# Patient Record
Sex: Female | Born: 1990 | Race: Black or African American | Hispanic: No | Marital: Single | Smoking: Former smoker
Health system: Southern US, Community
[De-identification: ages and names within clinical notes are randomized; demographics above are authoritative.]

## PROBLEM LIST (undated history)

## (undated) DIAGNOSIS — D649 Anemia, unspecified: Secondary | ICD-10-CM

## (undated) DIAGNOSIS — R87629 Unspecified abnormal cytological findings in specimens from vagina: Secondary | ICD-10-CM

## (undated) DIAGNOSIS — R7303 Prediabetes: Secondary | ICD-10-CM

## (undated) DIAGNOSIS — J9690 Respiratory failure, unspecified, unspecified whether with hypoxia or hypercapnia: Secondary | ICD-10-CM

## (undated) DIAGNOSIS — K439 Ventral hernia without obstruction or gangrene: Secondary | ICD-10-CM

## (undated) DIAGNOSIS — Z5189 Encounter for other specified aftercare: Secondary | ICD-10-CM

## (undated) HISTORY — DX: Encounter for other specified aftercare: Z51.89

## (undated) HISTORY — DX: Ventral hernia without obstruction or gangrene: K43.9

## (undated) HISTORY — DX: Respiratory failure, unspecified, unspecified whether with hypoxia or hypercapnia: J96.90

## (undated) HISTORY — DX: Unspecified abnormal cytological findings in specimens from vagina: R87.629

## (undated) HISTORY — DX: Anemia, unspecified: D64.9

---

## 2018-03-24 ENCOUNTER — Ambulatory Visit (INDEPENDENT_AMBULATORY_CARE_PROVIDER_SITE_OTHER): Payer: Self-pay

## 2018-03-24 ENCOUNTER — Encounter: Payer: Self-pay | Admitting: Obstetrics

## 2018-03-24 VITALS — BP 129/83 | HR 67 | Wt 247.6 lb

## 2018-03-24 DIAGNOSIS — Z3200 Encounter for pregnancy test, result unknown: Secondary | ICD-10-CM

## 2018-03-24 DIAGNOSIS — Z3201 Encounter for pregnancy test, result positive: Secondary | ICD-10-CM

## 2018-03-24 LAB — POCT URINE PREGNANCY: Preg Test, Ur: POSITIVE — AB

## 2018-03-24 NOTE — Progress Notes (Signed)
Pt is here for pregnancy test, UPT positive. Pt is very unsure of LMP. Possibly the last week of December.

## 2018-04-24 ENCOUNTER — Other Ambulatory Visit: Payer: Self-pay

## 2018-04-24 ENCOUNTER — Encounter: Payer: Self-pay | Admitting: Advanced Practice Midwife

## 2018-04-24 ENCOUNTER — Other Ambulatory Visit (HOSPITAL_COMMUNITY)
Admission: RE | Admit: 2018-04-24 | Discharge: 2018-04-24 | Disposition: A | Discharge status: Home / Self Care | Payer: Medicaid Other | Source: Ambulatory Visit | Attending: Advanced Practice Midwife | Admitting: Advanced Practice Midwife

## 2018-04-24 ENCOUNTER — Ambulatory Visit (INDEPENDENT_AMBULATORY_CARE_PROVIDER_SITE_OTHER): Payer: Medicaid Other | Admitting: Advanced Practice Midwife

## 2018-04-24 VITALS — BP 139/86 | HR 77 | Temp 97.4°F | Ht 62.0 in | Wt 247.8 lb

## 2018-04-24 DIAGNOSIS — Z3A10 10 weeks gestation of pregnancy: Secondary | ICD-10-CM | POA: Diagnosis not present

## 2018-04-24 DIAGNOSIS — Z23 Encounter for immunization: Secondary | ICD-10-CM

## 2018-04-24 DIAGNOSIS — Z3480 Encounter for supervision of other normal pregnancy, unspecified trimester: Secondary | ICD-10-CM | POA: Diagnosis present

## 2018-04-24 DIAGNOSIS — Z3481 Encounter for supervision of other normal pregnancy, first trimester: Secondary | ICD-10-CM | POA: Insufficient documentation

## 2018-04-24 DIAGNOSIS — O34219 Maternal care for unspecified type scar from previous cesarean delivery: Secondary | ICD-10-CM | POA: Insufficient documentation

## 2018-04-24 DIAGNOSIS — Z98891 History of uterine scar from previous surgery: Secondary | ICD-10-CM | POA: Insufficient documentation

## 2018-04-24 DIAGNOSIS — Z3687 Encounter for antenatal screening for uncertain dates: Secondary | ICD-10-CM

## 2018-04-24 DIAGNOSIS — O131 Gestational [pregnancy-induced] hypertension without significant proteinuria, first trimester: Secondary | ICD-10-CM

## 2018-04-24 NOTE — Progress Notes (Signed)
New OB. FLU vaccine given in LD, tolerated well.

## 2018-04-24 NOTE — Patient Instructions (Signed)
First Trimester of Pregnancy  The first trimester of pregnancy is from week 1 until the end of week 13 (months 1 through 3). A week after a sperm fertilizes an egg, the egg will implant on the wall of the uterus. This embryo will begin to develop into a baby. Genes from you and your partner will form the baby. The female genes will determine whether the baby will be a boy or a girl. At 6-8 weeks, the eyes and face will be formed, and the heartbeat can be seen on ultrasound. At the end of 12 weeks, all the baby's organs will be formed.  Now that you are pregnant, you will want to do everything you can to have a healthy baby. Two of the most important things are to get good prenatal care and to follow your health care provider's instructions. Prenatal care is all the medical care you receive before the baby's birth. This care will help prevent, find, and treat any problems during the pregnancy and childbirth.  Body changes during your first trimester  Your body goes through many changes during pregnancy. The changes vary from woman to woman.   You may gain or lose a couple of pounds at first.   You may feel sick to your stomach (nauseous) and you may throw up (vomit). If the vomiting is uncontrollable, call your health care provider.   You may tire easily.   You may develop headaches that can be relieved by medicines. All medicines should be approved by your health care provider.   You may urinate more often. Painful urination may mean you have a bladder infection.   You may develop heartburn as a result of your pregnancy.   You may develop constipation because certain hormones are causing the muscles that push stool through your intestines to slow down.   You may develop hemorrhoids or swollen veins (varicose veins).   Your breasts may begin to grow larger and become tender. Your nipples may stick out more, and the tissue that surrounds them (areola) may become darker.   Your gums may bleed and may be  sensitive to brushing and flossing.   Dark spots or blotches (chloasma, mask of pregnancy) may develop on your face. This will likely fade after the baby is born.   Your menstrual periods will stop.   You may have a loss of appetite.   You may develop cravings for certain kinds of food.   You may have changes in your emotions from day to day, such as being excited to be pregnant or being concerned that something may go wrong with the pregnancy and baby.   You may have more vivid and strange dreams.   You may have changes in your hair. These can include thickening of your hair, rapid growth, and changes in texture. Some women also have hair loss during or after pregnancy, or hair that feels dry or thin. Your hair will most likely return to normal after your baby is born.  What to expect at prenatal visits  During a routine prenatal visit:   You will be weighed to make sure you and the baby are growing normally.   Your blood pressure will be taken.   Your abdomen will be measured to track your baby's growth.   The fetal heartbeat will be listened to between weeks 10 and 14 of your pregnancy.   Test results from any previous visits will be discussed.  Your health care provider may ask you:     How you are feeling.   If you are feeling the baby move.   If you have had any abnormal symptoms, such as leaking fluid, bleeding, severe headaches, or abdominal cramping.   If you are using any tobacco products, including cigarettes, chewing tobacco, and electronic cigarettes.   If you have any questions.  Other tests that may be performed during your first trimester include:   Blood tests to find your blood type and to check for the presence of any previous infections. The tests will also be used to check for low iron levels (anemia) and protein on red blood cells (Rh antibodies). Depending on your risk factors, or if you previously had diabetes during pregnancy, you may have tests to check for high blood sugar  that affects pregnant women (gestational diabetes).   Urine tests to check for infections, diabetes, or protein in the urine.   An ultrasound to confirm the proper growth and development of the baby.   Fetal screens for spinal cord problems (spina bifida) and Down syndrome.   HIV (human immunodeficiency virus) testing. Routine prenatal testing includes screening for HIV, unless you choose not to have this test.   You may need other tests to make sure you and the baby are doing well.  Follow these instructions at home:  Medicines   Follow your health care provider's instructions regarding medicine use. Specific medicines may be either safe or unsafe to take during pregnancy.   Take a prenatal vitamin that contains at least 600 micrograms (mcg) of folic acid.   If you develop constipation, try taking a stool softener if your health care provider approves.  Eating and drinking     Eat a balanced diet that includes fresh fruits and vegetables, whole grains, good sources of protein such as meat, eggs, or tofu, and low-fat dairy. Your health care provider will help you determine the amount of weight gain that is right for you.   Avoid raw meat and uncooked cheese. These carry germs that can cause birth defects in the baby.   Eating four or five small meals rather than three large meals a day may help relieve nausea and vomiting. If you start to feel nauseous, eating a few soda crackers can be helpful. Drinking liquids between meals, instead of during meals, also seems to help ease nausea and vomiting.   Limit foods that are high in fat and processed sugars, such as fried and sweet foods.   To prevent constipation:  ? Eat foods that are high in fiber, such as fresh fruits and vegetables, whole grains, and beans.  ? Drink enough fluid to keep your urine clear or pale yellow.  Activity   Exercise only as directed by your health care provider. Most women can continue their usual exercise routine during  pregnancy. Try to exercise for 30 minutes at least 5 days a week. Exercising will help you:  ? Control your weight.  ? Stay in shape.  ? Be prepared for labor and delivery.   Experiencing pain or cramping in the lower abdomen or lower back is a good sign that you should stop exercising. Check with your health care provider before continuing with normal exercises.   Try to avoid standing for long periods of time. Move your legs often if you must stand in one place for a long time.   Avoid heavy lifting.   Wear low-heeled shoes and practice good posture.   You may continue to have sex unless your health care   provider tells you not to.  Relieving pain and discomfort   Wear a good support bra to relieve breast tenderness.   Take warm sitz baths to soothe any pain or discomfort caused by hemorrhoids. Use hemorrhoid cream if your health care provider approves.   Rest with your legs elevated if you have leg cramps or low back pain.   If you develop varicose veins in your legs, wear support hose. Elevate your feet for 15 minutes, 3-4 times a day. Limit salt in your diet.  Prenatal care   Schedule your prenatal visits by the twelfth week of pregnancy. They are usually scheduled monthly at first, then more often in the last 2 months before delivery.   Write down your questions. Take them to your prenatal visits.   Keep all your prenatal visits as told by your health care provider. This is important.  Safety   Wear your seat belt at all times when driving.   Make a list of emergency phone numbers, including numbers for family, friends, the hospital, and police and fire departments.  General instructions   Ask your health care provider for a referral to a local prenatal education class. Begin classes no later than the beginning of month 6 of your pregnancy.   Ask for help if you have counseling or nutritional needs during pregnancy. Your health care provider can offer advice or refer you to specialists for help  with various needs.   Do not use hot tubs, steam rooms, or saunas.   Do not douche or use tampons or scented sanitary pads.   Do not cross your legs for long periods of time.   Avoid cat litter boxes and soil used by cats. These carry germs that can cause birth defects in the baby and possibly loss of the fetus by miscarriage or stillbirth.   Avoid all smoking, herbs, alcohol, and medicines not prescribed by your health care provider. Chemicals in these products affect the formation and growth of the baby.   Do not use any products that contain nicotine or tobacco, such as cigarettes and e-cigarettes. If you need help quitting, ask your health care provider. You may receive counseling support and other resources to help you quit.   Schedule a dentist appointment. At home, brush your teeth with a soft toothbrush and be gentle when you floss.  Contact a health care provider if:   You have dizziness.   You have mild pelvic cramps, pelvic pressure, or nagging pain in the abdominal area.   You have persistent nausea, vomiting, or diarrhea.   You have a bad smelling vaginal discharge.   You have pain when you urinate.   You notice increased swelling in your face, hands, legs, or ankles.   You are exposed to fifth disease or chickenpox.   You are exposed to German measles (rubella) and have never had it.  Get help right away if:   You have a fever.   You are leaking fluid from your vagina.   You have spotting or bleeding from your vagina.   You have severe abdominal cramping or pain.   You have rapid weight gain or loss.   You vomit blood or material that looks like coffee grounds.   You develop a severe headache.   You have shortness of breath.   You have any kind of trauma, such as from a fall or a car accident.  Summary   The first trimester of pregnancy is from week 1 until   the end of week 13 (months 1 through 3).   Your body goes through many changes during pregnancy. The changes vary from  woman to woman.   You will have routine prenatal visits. During those visits, your health care provider will examine you, discuss any test results you may have, and talk with you about how you are feeling.  This information is not intended to replace advice given to you by your health care provider. Make sure you discuss any questions you have with your health care provider.  Document Released: 01/26/2001 Document Revised: 01/14/2016 Document Reviewed: 01/14/2016  Elsevier Interactive Patient Education  2019 [employer]

## 2018-04-24 NOTE — Progress Notes (Signed)
Subjective:   Cheryl Fischer is a 28 y.o. [id number] at [redacted]w[redacted]d by unsure LMP being seen today for her first obstetrical visit.  Her obstetrical history is significant for C/S x 4 with 2 set of twins and has Supervision of other normal pregnancy, antepartum on their problem list.. Patient does intend to breast feed. Pregnancy history fully reviewed.  Patient reports no complaints.  HISTORY: OB History  Gravida Para Term Preterm AB Living  5 4 3 1  0 7  SAB TAB Ectopic Multiple Live Births  0 0 0 2 6    # Outcome Date GA Lbr Len/2nd Weight Sex Delivery Anes PTL Lv  5 Current           4A Preterm  [redacted]w[redacted]d    CS-LVertical   LIV  4B Preterm  [redacted]w[redacted]d    CS-LVertical   LIV  3A Term      CS-LVertical   LIV  3B Term      CS-LVertical   LIV  2 Term      CS-LVertical   LIV  1 Term      CS-LVertical   LIV    Obstetric Comments  Twins   Past Medical History:  Diagnosis Date  . Anemia   . Blood transfusion without reported diagnosis   . Vaginal Pap smear, abnormal    Past Surgical History:  Procedure Laterality Date  . CESAREAN SECTION     Family History  Problem Relation Age of Onset  . Cancer Paternal Grandmother    Social History   Tobacco Use  . Smoking status: Never Smoker  . Smokeless tobacco: Never Used  Substance Use Topics  . Alcohol use: Never    Frequency: Never  . Drug use: Never   Allergies  Allergen Reactions  . Penicillins Hives   No current outpatient medications on file prior to visit.   No current facility-administered medications on file prior to visit.      Exam   Vitals:   04/24/18 1002 04/24/18 1003 04/24/18 1004  BP: (!) 143/97 139/86   Pulse: 71 77   Temp: (!) 97.4 F (36.3 C)    Weight: 112.4 kg    Height:   5\' 2"  (1.575 m)   Retake BP 130s/80s.  Fetal Heart Rate (bpm): 164  Uterus:     Pelvic Exam: Perineum: no hemorrhoids, normal perineum   Vulva: normal external genitalia, no lesions   Vagina:  normal mucosa, normal discharge   Cervix: no lesions and normal, pap smear done.    Adnexa: normal adnexa and no mass, fullness, tenderness   Bony Pelvis: average  System: General: well-developed, well-nourished female in no acute distress   Breast:  normal appearance, no masses or tenderness   Skin: normal coloration and turgor, no rashes   Neurologic: oriented, normal, negative, normal mood   Extremities: normal strength, tone, and muscle mass, ROM of all joints is normal   HEENT PERRLA, extraocular movement intact and sclera clear, anicteric   Mouth/Teeth mucous membranes moist, pharynx normal without lesions and dental hygiene good   Neck supple and no masses   Cardiovascular: regular rate and rhythm   Respiratory:  no respiratory distress, normal breath sounds   Abdomen: soft, non-tender; bowel sounds normal; no masses,  no organomegaly     Assessment:   Pregnancy: [id number] Patient Active Problem List   Diagnosis Date Noted  . Supervision of other normal pregnancy, antepartum 04/24/2018     Plan:  1. Supervision  of other normal pregnancy, antepartum --Anticipatory guidance about next visits/weeks of pregnancy given. - Obstetric Panel, Including HIV - Culture, OB Urine - Cytology - PAP( [place]) - Genetic Screening - Cervicovaginal ancillary only( [place])  2. Transient hypertension of pregnancy in first trimester --Pt denies any hx HTN.  Reports eating salty breakfast today.  Retake BP wnl so will continue to monitor.  3. Unsure of LMP (last menstrual period) as reason for ultrasound scan - US OB Comp Less 14 Wks; Future  4. History of cesarean delivery, currently pregnant --Hx C/S x 4 with 2 sets of twins, one at term, one at 34 weeks, 7 living children.    Initial labs drawn. Continue prenatal vitamins. Discussed and offered genetic screening options, including Quad screen/AFP, NIPS testing, and option to decline testing. Benefits/risks/alternatives reviewed. Pt aware that anatomy US is  form of genetic screening with lower accuracy in detecting trisomies than blood work.  Pt chooses/declines genetic screening today. NIPS: ordered. Ultrasound discussed; fetal anatomic survey: requested. Problem list reviewed and updated. The nature of [facility] with multiple MDs and other Advanced Practice Providers was explained to patient; also emphasized that residents, students are part of our team. Routine obstetric precautions reviewed. Return in about 4 weeks (around 05/22/2018).   Sharen Counter, CNM 04/24/18 11:15 AM

## 2018-04-25 LAB — OBSTETRIC PANEL, INCLUDING HIV
Antibody Screen: NEGATIVE
Basophils Absolute: 0 10*3/uL (ref 0.0–0.2)
Basos: 0 %
EOS (ABSOLUTE): 0.4 10*3/uL (ref 0.0–0.4)
Eos: 5 %
HIV Screen 4th Generation wRfx: NONREACTIVE
Hematocrit: 38.1 % (ref 34.0–46.6)
Hemoglobin: 13.1 g/dL (ref 11.1–15.9)
Hepatitis B Surface Ag: NEGATIVE
Immature Grans (Abs): 0 10*3/uL (ref 0.0–0.1)
Immature Granulocytes: 0 %
Lymphocytes Absolute: 1.7 10*3/uL (ref 0.7–3.1)
Lymphs: 20 %
MCH: 30.8 pg (ref 26.6–33.0)
MCHC: 34.4 g/dL (ref 31.5–35.7)
MCV: 89 fL (ref 79–97)
MONOCYTES: 5 %
Monocytes Absolute: 0.4 10*3/uL (ref 0.1–0.9)
Neutrophils Absolute: 5.8 10*3/uL (ref 1.4–7.0)
Neutrophils: 70 %
Platelets: 225 10*3/uL (ref 150–450)
RBC: 4.26 x10E6/uL (ref 3.77–5.28)
RDW: 12.6 % (ref 11.7–15.4)
RH TYPE: POSITIVE
RPR: NONREACTIVE
Rubella Antibodies, IGG: 1.84 index (ref >0.99)
WBC: 8.3 10*3/uL (ref 3.4–10.8)

## 2018-04-25 LAB — CYTOLOGY - PAP
Diagnosis: NEGATIVE
HPV: NOT DETECTED

## 2018-04-25 LAB — CERVICOVAGINAL ANCILLARY ONLY
Bacterial vaginitis: NEGATIVE
CHLAMYDIA, DNA PROBE: NEGATIVE
Candida vaginitis: POSITIVE — AB
NEISSERIA GONORRHEA: NEGATIVE
Trichomonas: NEGATIVE

## 2018-04-25 MED ORDER — TERCONAZOLE 0.4 % VA CREA: 1.0000 | TOPICAL_CREAM | Freq: Every day | VAGINAL | 0 refills | Status: DC

## 2018-04-25 NOTE — Addendum Note (Signed)
Addended by: Sharen Counter A on: 04/25/2018 05:45 PM   Modules accepted: Orders

## 2018-04-26 ENCOUNTER — Telehealth: Payer: Self-pay | Admitting: *Deleted

## 2018-04-26 LAB — CULTURE, OB URINE

## 2018-04-26 LAB — URINE CULTURE, OB REFLEX

## 2018-04-26 NOTE — Telephone Encounter (Signed)
Patient called requesting her prescriptions to go to [facility]. Please advise [phone]

## 2018-05-01 ENCOUNTER — Other Ambulatory Visit: Payer: Self-pay | Admitting: Advanced Practice Midwife

## 2018-05-01 ENCOUNTER — Other Ambulatory Visit (HOSPITAL_COMMUNITY): Payer: Self-pay | Admitting: Obstetrics and Gynecology

## 2018-05-01 ENCOUNTER — Other Ambulatory Visit: Payer: Self-pay

## 2018-05-01 ENCOUNTER — Ambulatory Visit (HOSPITAL_COMMUNITY)
Admission: RE | Admit: 2018-05-01 | Discharge: 2018-05-01 | Disposition: A | Discharge status: Home / Self Care | Payer: Medicaid Other | Source: Ambulatory Visit | Attending: Advanced Practice Midwife | Admitting: Advanced Practice Midwife

## 2018-05-01 ENCOUNTER — Encounter: Payer: Self-pay | Admitting: Advanced Practice Midwife

## 2018-05-01 DIAGNOSIS — Z3687 Encounter for antenatal screening for uncertain dates: Secondary | ICD-10-CM

## 2018-05-01 DIAGNOSIS — Z3689 Encounter for other specified antenatal screening: Secondary | ICD-10-CM

## 2018-05-08 ENCOUNTER — Encounter: Payer: Self-pay | Admitting: Obstetrics and Gynecology

## 2018-05-08 DIAGNOSIS — Z8759 Personal history of other complications of pregnancy, childbirth and the puerperium: Secondary | ICD-10-CM

## 2018-05-08 DIAGNOSIS — Z87898 Personal history of other specified conditions: Secondary | ICD-10-CM | POA: Insufficient documentation

## 2018-05-08 DIAGNOSIS — R030 Elevated blood-pressure reading, without diagnosis of hypertension: Secondary | ICD-10-CM | POA: Insufficient documentation

## 2018-05-08 NOTE — Progress Notes (Signed)
Changed dating in Epic per MFM [place] of 05/01/18

## 2018-05-22 ENCOUNTER — Ambulatory Visit (INDEPENDENT_AMBULATORY_CARE_PROVIDER_SITE_OTHER): Payer: Medicaid Other | Admitting: Advanced Practice Midwife

## 2018-05-22 ENCOUNTER — Other Ambulatory Visit: Payer: Self-pay

## 2018-05-22 VITALS — BP 121/84 | HR 75 | Wt 247.5 lb

## 2018-05-22 DIAGNOSIS — Z3A17 17 weeks gestation of pregnancy: Secondary | ICD-10-CM

## 2018-05-22 DIAGNOSIS — O26892 Other specified pregnancy related conditions, second trimester: Secondary | ICD-10-CM

## 2018-05-22 DIAGNOSIS — K429 Umbilical hernia without obstruction or gangrene: Secondary | ICD-10-CM

## 2018-05-22 DIAGNOSIS — Z3480 Encounter for supervision of other normal pregnancy, unspecified trimester: Secondary | ICD-10-CM

## 2018-05-22 MED ORDER — CONCEPT OB 130-92.4-1 MG PO CAPS: 1.0000 | ORAL_CAPSULE | Freq: Every day | ORAL | 11 refills | Status: DC

## 2018-05-22 NOTE — Patient Instructions (Signed)

## 2018-05-22 NOTE — Progress Notes (Signed)
ROB/AFP. Wants PNV.  Reports no problems today.  Does not have access to a BP cuff.

## 2018-05-22 NOTE — Progress Notes (Signed)
   PRENATAL VISIT NOTE  Subjective:  Cheryl Fischer is a 28 y.o. [phone] at [redacted]w[redacted]d being seen today for ongoing prenatal care.  She is currently monitored for the following issues for this low-risk pregnancy and has Supervision of other normal pregnancy, antepartum; History of cesarean delivery, currently pregnant; H/O multiple gestation; and Elevated blood pressure reading without diagnosis of hypertension on their problem list.  Patient reports no complaints.  Contractions: Not present. Vag. Bleeding: None.  Movement: Present. Denies leaking of fluid.   The following portions of the patient's history were reviewed and updated as appropriate: allergies, current medications, past family history, past medical history, past social history, past surgical history and problem list.   Objective:   Vitals:   05/22/18 0857  BP: 121/84  Pulse: 75  Weight: 112.3 kg    Fetal Status: Fetal Heart Rate (bpm): 155   Movement: Present     General:  Alert, oriented and cooperative. Patient is in no acute distress.  Skin: Skin is warm and dry. No rash noted.   Cardiovascular: Normal heart rate noted  Respiratory: Normal respiratory effort, no problems with respiration noted  Abdomen: Soft, gravid, appropriate for gestational age.  Pain/Pressure: Absent     Pelvic: Cervical exam deferred        Extremities: Normal range of motion.  Edema: None  Mental Status: Normal mood and affect. Normal behavior. Normal judgment and thought content.   Assessment and Plan:  Pregnancy: [id number] at [redacted]w[redacted]d 1. Supervision of other normal pregnancy, antepartum --Anticipatory guidance about next visits/weeks of pregnancy given. --Reviewed safety, visitor policy, reassurance about COVID-19 for pregnancy at this time. Discussed possible changes to visits, including televisits, that may occur due to COVID-19.  The office remains open if pt needs to be seen and MAU is open 24 hours/day for OB emergencies.  --Pt without phone  service currently, but does have smart phone with wifi so able to use Babyscripts app to enter BP. BP cuff given today.   - AFP, Serum, Open Spina Bifida - Babyscripts Schedule Optimization - Enroll Patient in Babyscripts   2. Umbilical hernia without obstruction and without gangrene --Stable, causes some intermittent pain, no severe pain or associated symptoms.  Heat, tylenol, gently press on hernia to relieve pain.  Pt would like to discuss surgical repair after pregnancy.  S/Sx of incarceration/reasons to seek care reviewed.    Preterm labor symptoms and general obstetric precautions including but not limited to vaginal bleeding, contractions, leaking of fluid and fetal movement were reviewed in detail with the patient. Please refer to After Visit Summary for other counseling recommendations.   Return in about 4 weeks (around 06/19/2018).  Future Appointments  Date Time Provider Department Center  05/22/2018  9:30 AM Hurshel Party, CNM [facility] None  05/29/2018  9:30 AM [facility] [place] 1 [facility]    Sharen Counter, CNM

## 2018-05-25 LAB — AFP, SERUM, OPEN SPINA BIFIDA
AFP MoM: 0.97
AFP Value: 33.8 ng/mL
Gest. Age on Collection Date: 17.9 weeks
Maternal Age At EDD: 27.9 yr
OSBR Risk 1 IN: 10000
Test Results:: NEGATIVE
Weight: 247 [lb_av]

## 2018-05-29 ENCOUNTER — Ambulatory Visit (HOSPITAL_COMMUNITY): Payer: Medicaid Other | Attending: Obstetrics and Gynecology

## 2018-06-19 ENCOUNTER — Encounter: Payer: Medicaid Other | Admitting: Obstetrics

## 2018-06-19 ENCOUNTER — Encounter: Payer: Medicaid Other | Admitting: Obstetrics and Gynecology

## 2018-06-21 ENCOUNTER — Encounter: Payer: Self-pay | Admitting: Obstetrics

## 2018-06-21 ENCOUNTER — Other Ambulatory Visit: Payer: Self-pay

## 2018-06-21 ENCOUNTER — Ambulatory Visit (INDEPENDENT_AMBULATORY_CARE_PROVIDER_SITE_OTHER): Payer: Medicaid Other | Admitting: Obstetrics

## 2018-06-21 DIAGNOSIS — Z3A22 22 weeks gestation of pregnancy: Secondary | ICD-10-CM

## 2018-06-21 DIAGNOSIS — Z3480 Encounter for supervision of other normal pregnancy, unspecified trimester: Secondary | ICD-10-CM

## 2018-06-21 DIAGNOSIS — Z3482 Encounter for supervision of other normal pregnancy, second trimester: Secondary | ICD-10-CM

## 2018-06-21 NOTE — Progress Notes (Signed)
Webex ROB: No concerns per patient Pt needs batteries for BP cuff. Unable to check BP.

## 2018-06-21 NOTE — Progress Notes (Signed)
   TELEHEALTH VIRTUAL OBSTETRICS PRENATAL VISIT ENCOUNTER NOTE  I connected with Scherrie Merritts on 06/21/18 at  2:00 PM EDT by WebEx at home and verified that I am speaking with the correct person using two identifiers.   I discussed the limitations, risks, security and privacy concerns of performing an evaluation and management service by telephone and the availability of in person appointments. I also discussed with the patient that there may be a patient responsible charge related to this service. The patient expressed understanding and agreed to proceed. Subjective:  Cheryl Fischer is a 28 y.o. [phone] at [redacted]w[redacted]d being seen today for ongoing prenatal care.  She is currently monitored for the following issues for this low-risk pregnancy and has Supervision of other normal pregnancy, antepartum; History of cesarean delivery, currently pregnant; H/O multiple gestation; and Elevated blood pressure reading without diagnosis of hypertension on their problem list.  Patient reports no complaints.  Reports fetal movement. Contractions: Not present. Vag. Bleeding: None.  Movement: Present. Denies any contractions, bleeding or leaking of fluid.   The following portions of the patient's history were reviewed and updated as appropriate: allergies, current medications, past family history, past medical history, past social history, past surgical history and problem list.   Objective:  There were no vitals filed for this visit.  Fetal Status:     Movement: Present     General:  Alert, oriented and cooperative. Patient is in no acute distress.  Respiratory: Normal respiratory effort, no problems with respiration noted  Mental Status: Normal mood and affect. Normal behavior. Normal judgment and thought content.  Rest of physical exam deferred due to type of encounter  Assessment and Plan:  Pregnancy: [id number] at [redacted]w[redacted]d 1. Supervision of other normal pregnancy, antepartum   Preterm labor symptoms and general  obstetric precautions including but not limited to vaginal bleeding, contractions, leaking of fluid and fetal movement were reviewed in detail with the patient. I discussed the assessment and treatment plan with the patient. The patient was provided an opportunity to ask questions and all were answered. The patient agreed with the plan and demonstrated an understanding of the instructions. The patient was advised to call back or seek an in-person office evaluation/go to MAU at [facility] for any urgent or concerning symptoms. Please refer to After Visit Summary for other counseling recommendations.   I provided 10 minutes of face-to-face via WebEx time during this encounter.  Return in about 4 weeks (around 07/19/2018) for [facility].  No future appointments.  Coral Ceo, MD Center for [facility], [facility] 06-21-2018

## 2018-07-19 ENCOUNTER — Encounter: Payer: Self-pay | Admitting: Obstetrics

## 2018-07-19 ENCOUNTER — Ambulatory Visit (INDEPENDENT_AMBULATORY_CARE_PROVIDER_SITE_OTHER): Payer: Medicaid Other | Admitting: Obstetrics

## 2018-07-19 ENCOUNTER — Ambulatory Visit (HOSPITAL_COMMUNITY): Payer: Medicaid Other

## 2018-07-19 DIAGNOSIS — Z3A26 26 weeks gestation of pregnancy: Secondary | ICD-10-CM

## 2018-07-19 DIAGNOSIS — O34219 Maternal care for unspecified type scar from previous cesarean delivery: Secondary | ICD-10-CM

## 2018-07-19 DIAGNOSIS — R030 Elevated blood-pressure reading, without diagnosis of hypertension: Secondary | ICD-10-CM

## 2018-07-19 DIAGNOSIS — Z3480 Encounter for supervision of other normal pregnancy, unspecified trimester: Secondary | ICD-10-CM

## 2018-07-19 DIAGNOSIS — O26892 Other specified pregnancy related conditions, second trimester: Secondary | ICD-10-CM

## 2018-07-19 NOTE — Progress Notes (Signed)
   TELEHEALTH OBSTETRICS PRENATAL VIRTUAL VIDEO VISIT ENCOUNTER NOTE  Provider location: Center for [facility] [place]   I connected with Cheryl Fischer on 07/19/18 at  2:00 PM EDT by WebEx OB MyChart Video Encounter at home and verified that I am speaking with the correct person using two identifiers.   I discussed the limitations, risks, security and privacy concerns of performing an evaluation and management service by telephone and the availability of in person appointments. I also discussed with the patient that there may be a patient responsible charge related to this service. The patient expressed understanding and agreed to proceed. Subjective:  Cheryl Fischer is a 28 y.o. [phone] at [redacted]w[redacted]d being seen today for ongoing prenatal care.  She is currently monitored for the following issues for this low-risk pregnancy and has Supervision of other normal pregnancy, antepartum; History of cesarean delivery, currently pregnant; H/O multiple gestation; and Elevated blood pressure reading without diagnosis of hypertension on their problem list.  Patient reports backache.  Contractions: Not present. Vag. Bleeding: None.  Movement: Present. Denies any leaking of fluid.   The following portions of the patient's history were reviewed and updated as appropriate: allergies, current medications, past family history, past medical history, past social history, past surgical history and problem list.   Objective:  There were no vitals filed for this visit.  Fetal Status:     Movement: Present     General:  Alert, oriented and cooperative. Patient is in no acute distress.  Respiratory: Normal respiratory effort, no problems with respiration noted  Mental Status: Normal mood and affect. Normal behavior. Normal judgment and thought content.  Rest of physical exam deferred due to type of encounter  Imaging: No results found.  Assessment and Plan:  Pregnancy: [id number] at [redacted]w[redacted]d 1. Supervision of other  normal pregnancy, antepartum  2. History of cesarean delivery, currently preg  Preterm labor symptoms and general obstetric precautions including but not limited to vaginal bleeding, contractions, leaking of fluid and fetal movement were reviewed in detail with the patient. I discussed the assessment and treatment plan with the patient. The patient was provided an opportunity to ask questions and all were answered. The patient agreed with the plan and demonstrated an understanding of the instructions. The patient was advised to call back or seek an in-person office evaluation/go to [facility] at [facility] for any urgent or concerning symptoms. Please refer to After Visit Summary for other counseling recommendations.   I provided 10 minutes of face-to-face time during this encounter.  Return in about 2 weeks (around 08/02/2018) for ROB, IN OFFICE.  2 hour OGTT.  No future appointments.  Coral Ceo, MD Center for [facility], [facility] 07-19-2018

## 2018-07-19 NOTE — Progress Notes (Signed)
Webex ROB Pt forgot again to purchase batteries for BP cuff Did not attend u/s visit d/t family ER. She will call to r/s.

## 2018-07-25 ENCOUNTER — Ambulatory Visit (HOSPITAL_COMMUNITY): Payer: Medicaid Other | Attending: Obstetrics and Gynecology

## 2018-08-02 ENCOUNTER — Other Ambulatory Visit: Payer: Medicaid Other

## 2018-08-02 ENCOUNTER — Encounter: Payer: Medicaid Other | Admitting: Obstetrics & Gynecology

## 2018-08-07 ENCOUNTER — Other Ambulatory Visit: Payer: Medicaid Other

## 2018-08-07 ENCOUNTER — Encounter: Payer: Medicaid Other | Admitting: Obstetrics and Gynecology

## 2018-08-07 ENCOUNTER — Ambulatory Visit (HOSPITAL_COMMUNITY): Admission: RE | Admit: 2018-08-07 | Payer: Medicaid Other | Source: Ambulatory Visit

## 2018-08-17 ENCOUNTER — Other Ambulatory Visit: Payer: Medicaid Other

## 2018-08-17 ENCOUNTER — Ambulatory Visit (INDEPENDENT_AMBULATORY_CARE_PROVIDER_SITE_OTHER): Payer: Medicaid Other | Admitting: Certified Nurse Midwife

## 2018-08-17 ENCOUNTER — Other Ambulatory Visit: Payer: Self-pay

## 2018-08-17 ENCOUNTER — Encounter: Payer: Self-pay | Admitting: Certified Nurse Midwife

## 2018-08-17 VITALS — BP 117/67 | HR 94 | Wt 246.4 lb

## 2018-08-17 DIAGNOSIS — Z3480 Encounter for supervision of other normal pregnancy, unspecified trimester: Secondary | ICD-10-CM

## 2018-08-17 DIAGNOSIS — O34219 Maternal care for unspecified type scar from previous cesarean delivery: Secondary | ICD-10-CM

## 2018-08-17 DIAGNOSIS — Z23 Encounter for immunization: Secondary | ICD-10-CM

## 2018-08-17 DIAGNOSIS — Z3A30 30 weeks gestation of pregnancy: Secondary | ICD-10-CM

## 2018-08-17 DIAGNOSIS — O24410 Gestational diabetes mellitus in pregnancy, diet controlled: Secondary | ICD-10-CM

## 2018-08-17 DIAGNOSIS — O99213 Obesity complicating pregnancy, third trimester: Secondary | ICD-10-CM

## 2018-08-17 DIAGNOSIS — O99210 Obesity complicating pregnancy, unspecified trimester: Secondary | ICD-10-CM | POA: Insufficient documentation

## 2018-08-17 NOTE — Progress Notes (Signed)
   PRENATAL VISIT NOTE  Subjective:  Cheryl Fischer is a 28 y.o. [phone] at [redacted]w[redacted]d being seen today for ongoing prenatal care.  She is currently monitored for the following issues for this low-risk pregnancy and has Supervision of other normal pregnancy, antepartum; History of cesarean delivery, currently pregnant; H/O multiple gestation; Elevated blood pressure reading without diagnosis of hypertension; and Obesity during pregnancy on their problem list.  Patient reports no complaints.  Contractions: Not present. Vag. Bleeding: None.  Movement: Present. Denies leaking of fluid.   The following portions of the patient's history were reviewed and updated as appropriate: allergies, current medications, past family history, past medical history, past social history, past surgical history and problem list.   Objective:   Vitals:   08/17/18 0938  BP: 117/67  Pulse: 94  Weight: 246 lb 6.4 oz (111.8 kg)    Fetal Status: Fetal Heart Rate (bpm): 145 Fundal Height: 37 cm Movement: Present     General:  Alert, oriented and cooperative. Patient is in no acute distress.  Skin: Skin is warm and dry. No rash noted.   Cardiovascular: Normal heart rate noted  Respiratory: Normal respiratory effort, no problems with respiration noted  Abdomen: Soft, gravid, appropriate for gestational age.  Pain/Pressure: Present     Pelvic: Cervical exam deferred        Extremities: Normal range of motion.  Edema: Trace  Mental Status: Normal mood and affect. Normal behavior. Normal judgment and thought content.   Assessment and Plan:  Pregnancy: [id number] at [redacted]w[redacted]d 1. Supervision of other normal pregnancy, antepartum - Patient doing well, no complaints - Anticipatory guidance on upcoming appointments  - Routine prenatal care - Tdap vaccine greater than or equal to 7yo IM - Glucose Tolerance, 2 Hours w/1 Hour - HIV antibody (with reflex) - RPR - CBC - Babyscripts Schedule Optimization  2. History of cesarean  delivery, currently pregnant - Needs RCS, needs to be scheduled with MD  3. Obesity during pregnancy - Size > dates , plan for fetal growth [place] at 34-35 weeks  - [place] MFM OB FOLLOW UP; Future  Preterm labor symptoms and general obstetric precautions including but not limited to vaginal bleeding, contractions, leaking of fluid and fetal movement were reviewed in detail with the patient. Please refer to After Visit Summary for other counseling recommendations.   Return in about 2 weeks (around 08/31/2018) for ROB-needs to sign consent .  Future Appointments  Date Time Provider [place]  08/31/2018  2:30 PM Sloan Leiter, MD [place] None    Lajean Manes, CNM

## 2018-08-17 NOTE — Progress Notes (Signed)
Patient reports fetal movement with occasional pressure. 

## 2018-08-17 NOTE — Patient Instructions (Signed)

## 2018-08-18 LAB — GLUCOSE TOLERANCE, 2 HOURS W/ 1HR
Glucose, 1 hour: 108 mg/dL (ref 65–179)
Glucose, 2 hour: 106 mg/dL (ref 65–152)
Glucose, Fasting: 93 mg/dL — ABNORMAL HIGH (ref 65–91)

## 2018-08-18 LAB — CBC
Hematocrit: 36.7 % (ref 34.0–46.6)
Hemoglobin: 12.3 g/dL (ref 11.1–15.9)
MCH: 31 pg (ref 26.6–33.0)
MCHC: 33.5 g/dL (ref 31.5–35.7)
MCV: 92 fL (ref 79–97)
Platelets: 219 10*3/uL (ref 150–450)
RBC: 3.97 x10E6/uL (ref 3.77–5.28)
RDW: 12.7 % (ref 11.7–15.4)
WBC: 7.6 10*3/uL (ref 3.4–10.8)

## 2018-08-18 LAB — HIV ANTIBODY (ROUTINE TESTING W REFLEX): HIV Screen 4th Generation wRfx: NONREACTIVE

## 2018-08-18 LAB — RPR: RPR Ser Ql: NONREACTIVE

## 2018-08-21 ENCOUNTER — Other Ambulatory Visit: Payer: Self-pay

## 2018-08-21 DIAGNOSIS — O24419 Gestational diabetes mellitus in pregnancy, unspecified control: Secondary | ICD-10-CM

## 2018-08-21 HISTORY — DX: Gestational diabetes mellitus in pregnancy, unspecified control: O24.419

## 2018-08-21 MED ORDER — GLUCOSE BLOOD VI STRP: ORAL_STRIP | 12 refills | Status: DC

## 2018-08-21 MED ORDER — ACCU-CHEK FASTCLIX LANCETS MISC: 1.0000 | Freq: Four times a day (QID) | 12 refills | Status: DC

## 2018-08-21 MED ORDER — ACCU-CHEK GUIDE W/DEVICE KIT: 1.0000 | PACK | Freq: Four times a day (QID) | 0 refills | Status: DC

## 2018-08-21 NOTE — Addendum Note (Signed)
Addended by: Lajean Manes on: 08/21/2018 03:19 AM   Modules accepted: Orders

## 2018-08-31 ENCOUNTER — Ambulatory Visit (INDEPENDENT_AMBULATORY_CARE_PROVIDER_SITE_OTHER): Payer: Medicaid Other | Admitting: Obstetrics and Gynecology

## 2018-08-31 ENCOUNTER — Encounter: Payer: Self-pay | Admitting: Obstetrics and Gynecology

## 2018-08-31 ENCOUNTER — Other Ambulatory Visit: Payer: Self-pay

## 2018-08-31 VITALS — BP 124/78 | HR 105 | Wt 244.2 lb

## 2018-08-31 DIAGNOSIS — Z8759 Personal history of other complications of pregnancy, childbirth and the puerperium: Secondary | ICD-10-CM

## 2018-08-31 DIAGNOSIS — O99213 Obesity complicating pregnancy, third trimester: Secondary | ICD-10-CM

## 2018-08-31 DIAGNOSIS — O24419 Gestational diabetes mellitus in pregnancy, unspecified control: Secondary | ICD-10-CM

## 2018-08-31 DIAGNOSIS — Z87898 Personal history of other specified conditions: Secondary | ICD-10-CM

## 2018-08-31 DIAGNOSIS — O99210 Obesity complicating pregnancy, unspecified trimester: Secondary | ICD-10-CM

## 2018-08-31 DIAGNOSIS — O34219 Maternal care for unspecified type scar from previous cesarean delivery: Secondary | ICD-10-CM

## 2018-08-31 DIAGNOSIS — R030 Elevated blood-pressure reading, without diagnosis of hypertension: Secondary | ICD-10-CM

## 2018-08-31 DIAGNOSIS — Z3480 Encounter for supervision of other normal pregnancy, unspecified trimester: Secondary | ICD-10-CM

## 2018-08-31 DIAGNOSIS — Z3A32 32 weeks gestation of pregnancy: Secondary | ICD-10-CM

## 2018-08-31 MED ORDER — ACCU-CHEK GUIDE VI STRP: ORAL_STRIP | 12 refills | Status: DC

## 2018-08-31 NOTE — Progress Notes (Addendum)
Patient reports fetal movement, denies pain. Advised pt that glucose supplies are at pharmacy and someone will contact for appt with diabetic teaching.

## 2018-08-31 NOTE — Patient Instructions (Signed)
Use the following websites (and others) to help learn more about your contraception options and find the method that is right for you!  - The Centers for Disease Control (CDC) website: TransferLive.sehttps://www.cdc.gov/reproductivehealth/contraception/index.htm  - [facility] website: https://www.plannedparenthood.org/learn/birth-control  - Bedsider.org: https://www.bedsider.org/methods   Etonogestrel implant What is this medicine? ETONOGESTREL (et oh noe JES trel) is a contraceptive (birth control) device. It is used to prevent pregnancy. It can be used for up to 3 years. This medicine may be used for other purposes; ask your health care provider or pharmacist if you have questions. COMMON BRAND NAME(S): Implanon, Nexplanon What should I tell my health care provider before I take this medicine? They need to know if you have any of these conditions:  abnormal vaginal bleeding  blood vessel disease or blood clots  breast, cervical, endometrial, ovarian, liver, or uterine cancer  diabetes  gallbladder disease  heart disease or recent heart attack  high blood pressure  high cholesterol or triglycerides  kidney disease  liver disease  migraine headaches  seizures  stroke  tobacco smoker  an unusual or allergic reaction to etonogestrel, anesthetics or antiseptics, other medicines, foods, dyes, or preservatives  pregnant or trying to get pregnant  breast-feeding How should I use this medicine? This device is inserted just under the skin on the inner side of your upper arm by a health care professional. Talk to your pediatrician regarding the use of this medicine in children. Special care may be needed. Overdosage: If you think you have taken too much of this medicine contact a poison control center or emergency room at once. NOTE: This medicine is only for you. Do not share this medicine with others. What if I miss a dose? This does not apply. What may interact with this  medicine? Do not take this medicine with any of the following medications:  amprenavir  fosamprenavir This medicine may also interact with the following medications:  acitretin  aprepitant  armodafinil  bexarotene  bosentan  carbamazepine  certain medicines for fungal infections like fluconazole, ketoconazole, itraconazole and voriconazole  certain medicines to treat hepatitis, HIV or AIDS  cyclosporine  felbamate  griseofulvin  lamotrigine  modafinil  oxcarbazepine  phenobarbital  phenytoin  primidone  rifabutin  rifampin  rifapentine  St. John's wort  topiramate This list may not describe all possible interactions. Give your health care provider a list of all the medicines, herbs, non-prescription drugs, or dietary supplements you use. Also tell them if you smoke, drink alcohol, or use illegal drugs. Some items may interact with your medicine. What should I watch for while using this medicine? This product does not protect you against HIV infection (AIDS) or other sexually transmitted diseases. You should be able to feel the implant by pressing your fingertips over the skin where it was inserted. Contact your doctor if you cannot feel the implant, and use a non-hormonal birth control method (such as condoms) until your doctor confirms that the implant is in place. Contact your doctor if you think that the implant may have broken or become bent while in your arm. You will receive a user card from your health care provider after the implant is inserted. The card is a record of the location of the implant in your upper arm and when it should be removed. Keep this card with your health records. What side effects may I notice from receiving this medicine? Side effects that you should report to your doctor or health care professional as soon as  possible:  allergic reactions like skin rash, itching or hives, swelling of the face, lips, or tongue  breast lumps,  breast tissue changes, or discharge  breathing problems  changes in emotions or moods  if you feel that the implant may have broken or bent while in your arm  high blood pressure  pain, irritation, swelling, or bruising at the insertion site  scar at site of insertion  signs of infection at the insertion site such as fever, and skin redness, pain or discharge  signs and symptoms of a blood clot such as breathing problems; changes in vision; chest pain; severe, sudden headache; pain, swelling, warmth in the leg; trouble speaking; sudden numbness or weakness of the face, arm or leg  signs and symptoms of liver injury like dark yellow or brown urine; general ill feeling or flu-like symptoms; light-colored stools; loss of appetite; nausea; right upper belly pain; unusually weak or tired; yellowing of the eyes or skin  unusual vaginal bleeding, discharge Side effects that usually do not require medical attention (report to your doctor or health care professional if they continue or are bothersome):  acne  breast pain or tenderness  headache  irregular menstrual bleeding  nausea This list may not describe all possible side effects. Call your doctor for medical advice about side effects. You may report side effects to FDA at 1-800-FDA-1088. Where should I keep my medicine? This drug is given in a hospital or clinic and will not be stored at home. NOTE: This sheet is a summary. It may not cover all possible information. If you have questions about this medicine, talk to your doctor, pharmacist, or health care provider.  2020 Elsevier/Gold Standard (2016-12-21 14:11:42)  Intrauterine Device Information An intrauterine device (IUD) is a medical device that is inserted in the uterus to prevent pregnancy. It is a small, T-shaped device that has one or two nylon strings hanging down from it. The strings hang out of the lower part of the uterus (cervix) to allow for future IUD removal.  There are two types of IUDs available:  Hormone IUD. This type of IUD is made of plastic and contains the hormone progestin (synthetic progesterone). A hormone IUD may last 3-5 years.  Copper IUD. This type of IUD has copper wire wrapped around it. A copper IUD may last up to 10 years. How is an IUD inserted? An IUD is inserted through the vagina and placed into the uterus with a minor medical procedure. The exact procedure for IUD insertion may vary among health care providers and hospitals. How does an IUD work? Synthetic progesterone in a hormonal IUD prevents pregnancy by:  Thickening cervical mucus to prevent sperm from entering the uterus.  Thinning the uterine lining to prevent a fertilized egg from being implanted there. Copper in a copper IUD prevents pregnancy by making the uterus and fallopian tubes produce a fluid that kills sperm. What are the advantages of an IUD? Advantages of either type of IUD  It is highly effective in preventing pregnancy.  It is reversible. You can become pregnant shortly after the IUD is removed.  It is low-maintenance and can stay in place for a long time.  There are no estrogen-related side effects.  It can be used when breastfeeding.  It is not associated with weight gain.  It can be inserted right after childbirth, an abortion, or a miscarriage. Advantages of a hormone IUD  If it is inserted within 7 days of your period starting, it works  right after it is inserted. If the hormone IUD is inserted at any other time in your cycle, you will need to use a backup method of birth control for 7 days after insertion.  It can make menstrual periods lighter.  It can reduce menstrual cramping.  It can be used for 3-5 years. Advantages of a copper IUD  It works right after it is inserted.  It can be used as a form of emergency birth control if it is inserted within 5 days after having unprotected sex.  It does not interfere with your body's  natural hormones.  It can be used for 10 years. What are the disadvantages of an IUD?  An IUD may cause irregular menstrual bleeding for a period of time after insertion.  You may have pain during insertion and have cramping and vaginal bleeding after insertion.  An IUD may cut the uterus (uterine perforation) when it is inserted. This is rare.  An IUD may cause pelvic inflammatory disease (PID), which is an infection in the uterus and fallopian tubes. This is rare, and it usually happens during the first 20 days after the IUD is inserted.  A copper IUD can make your menstrual flow heavier and more painful. How is an IUD removed?  You will lie on your back with your knees bent and your feet in footrests (stirrups).  A device will be inserted into your vagina to spread apart the vaginal walls (speculum). This will allow your health care provider to see the strings attached to the IUD.  Your health care provider will use a small instrument (forceps) to grasp the IUD strings and pull firmly until the IUD is removed. You may have some discomfort when the IUD is removed. Your health care provider may recommend taking over-the-counter pain relievers, such as ibuprofen, before the procedure. You may also have minor spotting for a few days after the procedure. The exact procedure for IUD removal may vary among health care providers and hospitals. Is the IUD right for me? Your health care provider will make sure you are a good candidate for an IUD and will discuss the advantages, disadvantages, and possible side effects with you. Summary  An intrauterine device (IUD) is a medical device that is inserted in the uterus to prevent pregnancy. It is a small, T-shaped device that has one or two nylon strings hanging down from it.  A hormone IUD contains the hormone progestin (synthetic progesterone). A copper IUD has copper wire wrapped around it.  Synthetic progesterone in a hormone IUD prevents  pregnancy by thickening cervical mucus and thinning the walls of the uterus. Copper in a copper IUD prevents pregnancy by making the uterus and fallopian tubes produce a fluid that kills sperm.  A hormone IUD can be left in place for 3-5 years. A copper IUD can be left in place for up to 10 years.  An IUD is inserted and removed by a health care provider. You may feel some pain during insertion and removal. Your health care provider may recommend taking over-the-counter pain medicine, such as ibuprofen, before an IUD procedure. This information is not intended to replace advice given to you by your health care provider. Make sure you discuss any questions you have with your health care provider. Document Released: 01/06/2004 Document Revised: 01/14/2017 Document Reviewed: 03/02/2016 Elsevier Patient Education  2020 [employer].

## 2018-08-31 NOTE — Progress Notes (Signed)
   PRENATAL VISIT NOTE   Subjective:  Cheryl Fischer is a 28 y.o. [phone] at [redacted]w[redacted]d being seen today for ongoing prenatal care.  She is currently monitored for the following issues for this high-risk pregnancy and has Supervision of other normal pregnancy, antepartum; History of cesarean delivery, currently pregnant; H/O multiple gestation; Elevated blood pressure reading without diagnosis of hypertension; Obesity during pregnancy; and Gestational diabetes mellitus (GDM) on their problem list.  Patient reports no complaints.  Contractions: Not present. Vag. Bleeding: None.  Movement: Present. Denies leaking of fluid.   The following portions of the patient's history were reviewed and updated as appropriate: allergies, current medications, past family history, past medical history, past social history, past surgical history and problem list.   Objective:   Vitals:   08/31/18 1440  BP: 124/78  Pulse: (!) 105  Weight: 244 lb 3.2 oz (110.8 kg)    Fetal Status:     Movement: Present     General:  Alert, oriented and cooperative. Patient is in no acute distress.  Skin: Skin is warm and dry. No rash noted.   Cardiovascular: Normal heart rate noted  Respiratory: Normal respiratory effort, no problems with respiration noted  Abdomen: Soft, gravid, appropriate for gestational age.  Pain/Pressure: Absent     Pelvic: Cervical exam deferred        Extremities: Normal range of motion.  Edema: Trace  Mental Status: Normal mood and affect. Normal behavior. Normal judgment and thought content.   Assessment and Plan:  Pregnancy: L2G4010 at [redacted]w[redacted]d  1. Supervision of other normal pregnancy, antepartum Declines BTL, gave info for LARCs  2. History of cesarean delivery, currently pregnant Request sent to scheduler today - op report requested today  3. H/O multiple gestation 2 sets of twins  4. Elevated blood pressure reading without diagnosis of hypertension BP normal today  5. Obesity during  pregnancy  6. Gestational diabetes mellitus (GDM) in third trimester, gestational diabetes method of control unspecified - Not checking CBGs, states she "just isn't" - States she does not supplies - has appt with Diabetes education on 09/06/18  Preterm labor symptoms and general obstetric precautions including but not limited to vaginal bleeding, contractions, leaking of fluid and fetal movement were reviewed in detail with the patient. Please refer to After Visit Summary for other counseling recommendations.   Return in about 2 weeks (around 09/14/2018) for OB visit (MD), in person.  Future Appointments  Date Time Provider [place]  09/18/2018 11:00 AM WH-MFC [place] 3 WH-MFCUS MFC-US    Sloan Leiter, MD

## 2018-08-31 NOTE — Addendum Note (Signed)
Addended by: Tristan Schroeder D on: 08/31/2018 03:16 PM   Modules accepted: Orders

## 2018-09-05 ENCOUNTER — Other Ambulatory Visit: Payer: Self-pay | Admitting: Family Medicine

## 2018-09-12 ENCOUNTER — Other Ambulatory Visit: Payer: Self-pay | Admitting: Family Medicine

## 2018-09-14 ENCOUNTER — Encounter: Payer: Medicaid Other | Admitting: Family Medicine

## 2018-09-14 ENCOUNTER — Encounter: Payer: Self-pay | Admitting: Family Medicine

## 2018-09-14 NOTE — Progress Notes (Signed)
Patient did not keep appointment today. She will be called to reschedule.  

## 2018-09-18 ENCOUNTER — Ambulatory Visit (HOSPITAL_COMMUNITY): Payer: Medicaid Other

## 2018-09-20 ENCOUNTER — Ambulatory Visit (HOSPITAL_COMMUNITY): Payer: Medicaid Other

## 2018-09-20 ENCOUNTER — Encounter: Payer: Medicaid Other | Admitting: Certified Nurse Midwife

## 2018-09-25 ENCOUNTER — Other Ambulatory Visit (HOSPITAL_COMMUNITY)
Admission: RE | Admit: 2018-09-25 | Discharge: 2018-09-25 | Disposition: A | Discharge status: Home / Self Care | Payer: Medicaid Other | Source: Ambulatory Visit | Attending: Obstetrics & Gynecology | Admitting: Obstetrics & Gynecology

## 2018-09-25 ENCOUNTER — Other Ambulatory Visit: Payer: Self-pay

## 2018-09-25 ENCOUNTER — Ambulatory Visit (INDEPENDENT_AMBULATORY_CARE_PROVIDER_SITE_OTHER): Payer: Medicaid Other | Admitting: Obstetrics & Gynecology

## 2018-09-25 VITALS — BP 115/73 | HR 75 | Wt 242.0 lb

## 2018-09-25 DIAGNOSIS — Z3A35 35 weeks gestation of pregnancy: Secondary | ICD-10-CM | POA: Diagnosis not present

## 2018-09-25 DIAGNOSIS — Z3480 Encounter for supervision of other normal pregnancy, unspecified trimester: Secondary | ICD-10-CM

## 2018-09-25 DIAGNOSIS — O99213 Obesity complicating pregnancy, third trimester: Secondary | ICD-10-CM | POA: Diagnosis not present

## 2018-09-25 DIAGNOSIS — O99210 Obesity complicating pregnancy, unspecified trimester: Secondary | ICD-10-CM

## 2018-09-25 DIAGNOSIS — O24419 Gestational diabetes mellitus in pregnancy, unspecified control: Secondary | ICD-10-CM

## 2018-09-25 DIAGNOSIS — O34219 Maternal care for unspecified type scar from previous cesarean delivery: Secondary | ICD-10-CM

## 2018-09-25 LAB — GLUCOSE, POCT (MANUAL RESULT ENTRY): POC Glucose: 101 mg/dl — AB (ref 70–99)

## 2018-09-25 MED ORDER — CONCEPT OB 130-92.4-1 MG PO CAPS: 1.0000 | ORAL_CAPSULE | Freq: Every day | ORAL | 11 refills | Status: DC

## 2018-09-25 MED ORDER — METFORMIN HCL 500 MG PO TABS: ORAL_TABLET | ORAL | 3 refills | Status: DC

## 2018-09-25 NOTE — Addendum Note (Signed)
Addended by: Emily Filbert on: 09/25/2018 04:27 PM   Modules accepted: Orders

## 2018-09-25 NOTE — Addendum Note (Signed)
Addended by: Lewie Loron D on: 09/25/2018 04:28 PM   Modules accepted: Orders

## 2018-09-25 NOTE — Progress Notes (Signed)
   PRENATAL VISIT NOTE  Subjective:  Cheryl Fischer is a 28 y.o. [phone] at [redacted]w[redacted]d being seen today for ongoing prenatal care.  She is currently monitored for the following issues for this high-risk pregnancy and has Supervision of other normal pregnancy, antepartum; History of cesarean delivery, currently pregnant; H/O multiple gestation; Elevated blood pressure reading without diagnosis of hypertension; Obesity during pregnancy; and Gestational diabetes mellitus (GDM) on their problem list.  Patient reports no complaints.  Contractions: Irregular. Vag. Bleeding: None.  Movement: Present. Denies leaking of fluid.   The following portions of the patient's history were reviewed and updated as appropriate: allergies, current medications, past family history, past medical history, past social history, past surgical history and problem list.   Objective:   Vitals:   09/25/18 1602  BP: 115/73  Pulse: 75  Weight: 242 lb (109.8 kg)    Fetal Status: Fetal Heart Rate (bpm): 145   Movement: Present     General:  Alert, oriented and cooperative. Patient is in no acute distress.  Skin: Skin is warm and dry. No rash noted.   Cardiovascular: Normal heart rate noted  Respiratory: Normal respiratory effort, no problems with respiration noted  Abdomen: Soft, gravid, appropriate for gestational age.  Pain/Pressure: Absent     Pelvic: Cervical exam deferred        Extremities: Normal range of motion.     Mental Status: Normal mood and affect. Normal behavior. Normal judgment and thought content.   Assessment and Plan:  Pregnancy: [id number] at [redacted]w[redacted]d 1. Supervision of other normal pregnancy, antepartum  - Strep Gp B Culture+Rflx - Cervicovaginal ancillary only( [place]) - [place] MFM FETAL BPP W/NONSTRESS; Future  2. History of cesarean delivery, currently pregnant - she plans repeat  3. Obesity during pregnancy  - Hemoglobin A1c - [place] MFM FETAL BPP W/NONSTRESS; Future  4. Gestational diabetes  mellitus (GDM) in third trimester, gestational diabetes method of control unspecified - she did not bring sugars or meter but recalls that most of her fastings are in the high 90s, low 100s, reports normal 2 hour PCs - I will prescribed metformin at bedtime - Hemoglobin A1c - [place] MFM FETAL BPP W/NONSTRESS; Future - I have requested that she bring her meter to each visit   Preterm labor symptoms and general obstetric precautions including but not limited to vaginal bleeding, contractions, leaking of fluid and fetal movement were reviewed in detail with the patient. Please refer to After Visit Summary for other counseling recommendations.   Return in about 1 week (around 10/02/2018) for in person.  Future Appointments  Date Time Provider [place]  09/29/2018  2:15 PM WH-MFC [place] 4 WH-MFCUS MFC-US    Emily Filbert, MD

## 2018-09-26 LAB — HEMOGLOBIN A1C
Est. average glucose Bld gHb Est-mCnc: 91 mg/dL
Hgb A1c MFr Bld: 4.8 % (ref 4.8–5.6)

## 2018-09-27 LAB — CERVICOVAGINAL ANCILLARY ONLY
Chlamydia: NEGATIVE
Neisseria Gonorrhea: NEGATIVE

## 2018-09-29 ENCOUNTER — Other Ambulatory Visit: Payer: Self-pay | Admitting: Certified Nurse Midwife

## 2018-09-29 ENCOUNTER — Other Ambulatory Visit: Payer: Self-pay

## 2018-09-29 ENCOUNTER — Other Ambulatory Visit (HOSPITAL_COMMUNITY): Payer: Self-pay | Admitting: *Deleted

## 2018-09-29 ENCOUNTER — Ambulatory Visit (HOSPITAL_COMMUNITY): Payer: Medicaid Other

## 2018-09-29 ENCOUNTER — Ambulatory Visit (HOSPITAL_COMMUNITY)
Admission: RE | Admit: 2018-09-29 | Discharge: 2018-09-29 | Disposition: A | Discharge status: Home / Self Care | Payer: Medicaid Other | Source: Ambulatory Visit | Attending: Obstetrics and Gynecology | Admitting: Obstetrics and Gynecology

## 2018-09-29 DIAGNOSIS — O133 Gestational [pregnancy-induced] hypertension without significant proteinuria, third trimester: Secondary | ICD-10-CM | POA: Diagnosis not present

## 2018-09-29 DIAGNOSIS — Z3480 Encounter for supervision of other normal pregnancy, unspecified trimester: Secondary | ICD-10-CM | POA: Insufficient documentation

## 2018-09-29 DIAGNOSIS — O34219 Maternal care for unspecified type scar from previous cesarean delivery: Secondary | ICD-10-CM | POA: Diagnosis not present

## 2018-09-29 DIAGNOSIS — Z363 Encounter for antenatal screening for malformations: Secondary | ICD-10-CM

## 2018-09-29 DIAGNOSIS — O99210 Obesity complicating pregnancy, unspecified trimester: Secondary | ICD-10-CM

## 2018-09-29 DIAGNOSIS — Z3A36 36 weeks gestation of pregnancy: Secondary | ICD-10-CM

## 2018-09-29 DIAGNOSIS — O99213 Obesity complicating pregnancy, third trimester: Secondary | ICD-10-CM | POA: Diagnosis not present

## 2018-09-29 DIAGNOSIS — O24419 Gestational diabetes mellitus in pregnancy, unspecified control: Secondary | ICD-10-CM | POA: Diagnosis present

## 2018-09-29 DIAGNOSIS — O24415 Gestational diabetes mellitus in pregnancy, controlled by oral hypoglycemic drugs: Secondary | ICD-10-CM

## 2018-09-29 LAB — STREP GP B CULTURE+RFLX: Strep Gp B Culture+Rflx: NEGATIVE

## 2018-10-02 ENCOUNTER — Telehealth (HOSPITAL_COMMUNITY): Payer: Self-pay | Admitting: *Deleted

## 2018-10-02 NOTE — Telephone Encounter (Signed)
Preadmission screen  

## 2018-10-02 NOTE — Patient Instructions (Signed)
Cheryl Fischer  10/02/2018   Your procedure is scheduled on:  10/17/2018  Arrive at [place] at Entrance C on [address] at [facility]  and [employer]. You are invited to use the FREE valet parking or use the Visitor's parking deck.  Pick up the phone at the desk and dial [phone].  Call this number if you have problems the morning of surgery: [phone]  Remember:   Do not eat food:(After Midnight) Desps de medianoche.  Do not drink clear liquids: (After Midnight) Desps de medianoche.  Take these medicines the morning of surgery with A SIP OF WATER:  Do not take metformin the night before your surgery   Do not wear jewelry, make-up or nail polish.  Do not wear lotions, powders, or perfumes. Do not wear deodorant.  Do not shave 48 hours prior to surgery.  Do not bring valuables to the hospital.  [facility] is not   responsible for any belongings or valuables brought to the hospital.  Contacts, dentures or bridgework may not be worn into surgery.  Leave suitcase in the car. After surgery it may be brought to your room.  For patients admitted to the hospital, checkout time is 11:00 AM the day of              discharge.      Please read over the following fact sheets that you were given:     Preparing for Surgery

## 2018-10-03 ENCOUNTER — Ambulatory Visit (INDEPENDENT_AMBULATORY_CARE_PROVIDER_SITE_OTHER): Payer: Medicaid Other | Admitting: Obstetrics and Gynecology

## 2018-10-03 ENCOUNTER — Other Ambulatory Visit: Payer: Self-pay

## 2018-10-03 ENCOUNTER — Other Ambulatory Visit: Payer: Self-pay | Admitting: Obstetrics & Gynecology

## 2018-10-03 VITALS — BP 108/73 | HR 85 | Wt 242.0 lb

## 2018-10-03 DIAGNOSIS — O99213 Obesity complicating pregnancy, third trimester: Secondary | ICD-10-CM

## 2018-10-03 DIAGNOSIS — O24419 Gestational diabetes mellitus in pregnancy, unspecified control: Secondary | ICD-10-CM

## 2018-10-03 DIAGNOSIS — Z3480 Encounter for supervision of other normal pregnancy, unspecified trimester: Secondary | ICD-10-CM

## 2018-10-03 DIAGNOSIS — O34219 Maternal care for unspecified type scar from previous cesarean delivery: Secondary | ICD-10-CM

## 2018-10-03 DIAGNOSIS — Z3A37 37 weeks gestation of pregnancy: Secondary | ICD-10-CM

## 2018-10-03 DIAGNOSIS — O99210 Obesity complicating pregnancy, unspecified trimester: Secondary | ICD-10-CM

## 2018-10-03 DIAGNOSIS — O24415 Gestational diabetes mellitus in pregnancy, controlled by oral hypoglycemic drugs: Secondary | ICD-10-CM

## 2018-10-03 NOTE — Progress Notes (Signed)
ROB.  She states that she checks her BS sometime but she does not record it and that they have been low anyway.  Reports no problems Today

## 2018-10-03 NOTE — Progress Notes (Signed)
   PRENATAL VISIT NOTE  Subjective:  Pascuala Klutts is a 28 y.o. [phone] at [redacted]w[redacted]d being seen today for ongoing prenatal care.  She is currently monitored for the following issues for this high-risk pregnancy and has Supervision of other normal pregnancy, antepartum; History of cesarean delivery, currently pregnant; H/O multiple gestation; Elevated blood pressure reading without diagnosis of hypertension; Obesity during pregnancy; and Gestational diabetes mellitus (GDM) on their problem list.  Patient reports no complaints.  Contractions: Irregular. Vag. Bleeding: None.  Movement: Present. Denies leaking of fluid.   The following portions of the patient's history were reviewed and updated as appropriate: allergies, current medications, past family history, past medical history, past social history, past surgical history and problem list.   Objective:   Vitals:   10/03/18 1312  BP: 108/73  Pulse: 85  Weight: 242 lb (109.8 kg)    Fetal Status: Fetal Heart Rate (bpm): 148 Fundal Height: 39 cm Movement: Present     General:  Alert, oriented and cooperative. Patient is in no acute distress.  Skin: Skin is warm and dry. No rash noted.   Cardiovascular: Normal heart rate noted  Respiratory: Normal respiratory effort, no problems with respiration noted  Abdomen: Soft, gravid, appropriate for gestational age.  Pain/Pressure: Present     Pelvic: Cervical exam deferred        Extremities: Normal range of motion.  Edema: None  Mental Status: Normal mood and affect. Normal behavior. Normal judgment and thought content.   Assessment and Plan:  Pregnancy: [id number] at [redacted]w[redacted]d 1. Supervision of other normal pregnancy, antepartum Patient is doing well without complaints  2. Gestational diabetes mellitus (GDM) in third trimester controlled on oral hypoglycemic drug Patient admits to not checking CBGs as often but reports fasting as high as 95 and pp as high as 115 Continue metformin at bedtime  3.  History of cesarean delivery, currently pregnant Patient scheduled for repeat c-section on 9/1 Follow up BPP on 8/21  4. Obesity during pregnancy Excessive weight gain during pregnancy  Term labor symptoms and general obstetric precautions including but not limited to vaginal bleeding, contractions, leaking of fluid and fetal movement were reviewed in detail with the patient. Please refer to After Visit Summary for other counseling recommendations.   Return in about 1 week (around 10/10/2018) for [facility], [place], Low risk.  Future Appointments  Date Time Provider [place]  10/06/2018  3:45 PM [place] [facility]  10/06/2018  3:45 PM [place] 2 [facility]  10/10/2018  2:30 PM Shelly Bombard, MD [facility] None  10/13/2018  2:15 PM [place] [facility]  10/13/2018  2:15 PM [place] 4 [facility]-MFCUS M[facility]-US  10/15/2018  8:30 AM [facility] 1 [facility] None    Mora Bellman, MD

## 2018-10-04 ENCOUNTER — Telehealth (HOSPITAL_COMMUNITY): Payer: Self-pay | Admitting: *Deleted

## 2018-10-04 NOTE — Telephone Encounter (Signed)
Preadmission screen  

## 2018-10-05 ENCOUNTER — Other Ambulatory Visit: Payer: Self-pay

## 2018-10-05 ENCOUNTER — Inpatient Hospital Stay (HOSPITAL_COMMUNITY)
Admission: AD | Admit: 2018-10-05 | Discharge: 2018-10-05 | Disposition: A | Discharge status: Home / Self Care | Payer: Medicaid Other | Attending: Obstetrics and Gynecology | Admitting: Obstetrics and Gynecology

## 2018-10-05 ENCOUNTER — Encounter (HOSPITAL_COMMUNITY): Payer: Self-pay | Admitting: *Deleted

## 2018-10-05 DIAGNOSIS — O4693 Antepartum hemorrhage, unspecified, third trimester: Secondary | ICD-10-CM | POA: Diagnosis not present

## 2018-10-05 DIAGNOSIS — Z809 Family history of malignant neoplasm, unspecified: Secondary | ICD-10-CM | POA: Diagnosis not present

## 2018-10-05 DIAGNOSIS — Z880 Allergy status to penicillin: Secondary | ICD-10-CM | POA: Insufficient documentation

## 2018-10-05 DIAGNOSIS — O9989 Other specified diseases and conditions complicating pregnancy, childbirth and the puerperium: Secondary | ICD-10-CM | POA: Diagnosis not present

## 2018-10-05 DIAGNOSIS — R102 Pelvic and perineal pain: Secondary | ICD-10-CM | POA: Insufficient documentation

## 2018-10-05 DIAGNOSIS — Z7984 Long term (current) use of oral hypoglycemic drugs: Secondary | ICD-10-CM | POA: Diagnosis not present

## 2018-10-05 DIAGNOSIS — O34219 Maternal care for unspecified type scar from previous cesarean delivery: Secondary | ICD-10-CM | POA: Diagnosis not present

## 2018-10-05 DIAGNOSIS — Z3A37 37 weeks gestation of pregnancy: Secondary | ICD-10-CM

## 2018-10-05 LAB — URINALYSIS, ROUTINE W REFLEX MICROSCOPIC
Bacteria, UA: NONE SEEN
Bilirubin Urine: NEGATIVE
Glucose, UA: NEGATIVE mg/dL
Ketones, ur: 5 mg/dL — AB
Nitrite: NEGATIVE
Protein, ur: NEGATIVE mg/dL
Specific Gravity, Urine: 1.018 (ref 1.005–1.030)
pH: 7 (ref 5.0–8.0)

## 2018-10-05 NOTE — MAU Provider Note (Addendum)
Chief Complaint:  Vaginal Bleeding and Pelvic Pain   First Provider Initiated Contact with Patient 10/05/18 2112      HPI: Cheryl Fischer is a 28 y.o. [id number] at 73w2dho presents to maternity admissions reporting vaginal bleeding tonight.  Had intercourse a few days ago and had some brown discharge this morning. Then tonight passed a single clot that was red.  States had a small amount of watery discharge.   Has some intermittent cramping.  . She reports good fetal movement, denies LOF, vaginal itching/burning, urinary symptoms, h/a, dizziness, n/v, diarrhea, constipation or fever/chills.    Has a repeat C/S planned for September 1st.  Has a virtual visit next week  RN Note: Pt reports to MAU c/o vaginal bleeding that started a few hours ago. Pt reports when she went to the BR she noticed a brown discharge in her underwear. Then she stated she passed a clot and some blood in the toilet. Pt reports the clot was quarter sized. Pt reports some pelvic pain. No ctx. No LOF. +FM.   Past Medical History: Past Medical History:  Diagnosis Date  . Anemia   . Blood transfusion without reported diagnosis   . Vaginal Pap smear, abnormal     Past obstetric history: OB History  Gravida Para Term Preterm AB Living  '5 4 3 1   7  '$ SAB TAB Ectopic Multiple Live Births        2 6    # Outcome Date GA Lbr Len/2nd Weight Sex Delivery Anes PTL Lv  5 Current           4A Preterm  320w0d  CS-LVertical   LIV  4B Preterm  3488w0d CS-LVertical   LIV  3A Term      CS-LVertical   LIV  3B Term      CS-LVertical   LIV  2 Term      CS-LVertical   LIV  1 Term      CS-LVertical   LIV    Obstetric Comments  Twins    Past Surgical History: Past Surgical History:  Procedure Laterality Date  . CESAREAN SECTION      Family History: Family History  Problem Relation Age of Onset  . Cancer Paternal Grandmother     Social History: Social History   Tobacco Use  . Smoking status: Never Smoker  . Smokeless  tobacco: Never Used  Substance Use Topics  . Alcohol use: Never    Frequency: Never  . Drug use: Never    Allergies:  Allergies  Allergen Reactions  . Penicillins Hives    Meds:  Medications Prior to Admission  Medication Sig Dispense Refill Last Dose  . Accu-Chek FastClix Lancets MISC 1 Device by Percutaneous route 4 (four) times daily. (Patient not taking: Reported on 08/31/2018) 100 each 12   . Blood Glucose Monitoring Suppl (ACCU-CHEK GUIDE) w/Device KIT 1 kit by Subdermal route 4 (four) times daily. Check blood sugars for fasting, two hours after breakfast, lunch and dinner (4 checks daily) (Patient not taking: Reported on 08/31/2018) 1 kit 0   . glucose blood (ACCU-CHEK GUIDE) test strip Use as instructed (Patient not taking: Reported on 08/31/2018) 100 each 12   . glucose blood test strip Use as instructed (Patient not taking: Reported on 08/31/2018) 100 each 12   . metFORMIN (GLUCOPHAGE) 500 MG tablet Take 1 pill orally at bedtime every night 30 tablet 3   . Prenat w/o A Vit-FeFum-FePo-FA (CONCEPT OB)  130-92.4-1 MG CAPS Take 1 capsule by mouth daily. 30 capsule 11   . terconazole (TERAZOL 7) 0.4 % vaginal cream Place 1 applicator vaginally at bedtime. (Patient not taking: Reported on 06/21/2018) 45 g 0     I have reviewed patient's Past Medical Hx, Surgical Hx, Family Hx, Social Hx, medications and allergies.   ROS:  Review of Systems  Constitutional: Negative for chills and fever.  Respiratory: Negative for shortness of breath.   Gastrointestinal: Negative for abdominal pain, constipation, diarrhea, nausea and vomiting.  Genitourinary: Positive for vaginal bleeding. Negative for pelvic pain.  Neurological: Negative for dizziness.   Other systems negative  Physical Exam   Patient Vitals for the past 24 hrs:  BP Temp Temp src Pulse Resp  10/05/18 2110 118/66 - - 74 -  10/05/18 2053 117/75 98.1 F (36.7 C) Oral 75 19   Constitutional: Well-developed, well-nourished female  in no acute distress.  Cardiovascular: normal rate and rhythm Respiratory: normal effort, clear to auscultation bilaterally GI: Abd soft, non-tender, gravid appropriate for gestational age.   No rebound or guarding.  Of note, there is a golfball sized peri-umbilical hernia noted to left of umbilicus, mildly tender, no incarceration MS: Extremities nontender, no edema, normal ROM Neurologic: Alert and oriented x 4.  GU: Neg CVAT.  PELVIC EXAM: Cervix pink, visually closed, without lesion, scant white creamy discharge, vaginal walls and external genitalia normal    NO POOLING, NO FERNING  Cervix Dilation: 1 Effacement (%): 60 Station: -2 Presentation: Vertex Exam by:: Carmelia Roller CNM   FHT:  Baseline 140 , moderate variability, accelerations present, no decelerations Contractions: Occasional / Irregular     Labs: Results for orders placed or performed during the hospital encounter of 10/05/18 (from the past 24 hour(s))  Urinalysis, Routine w reflex microscopic     Status: Abnormal   Collection Time: 10/05/18  9:17 PM  Result Value Ref Range   Color, Urine YELLOW YELLOW   APPearance HAZY (A) CLEAR   Specific Gravity, Urine 1.018 1.005 - 1.030   pH 7.0 5.0 - 8.0   Glucose, UA NEGATIVE NEGATIVE mg/dL   Hgb urine dipstick SMALL (A) NEGATIVE   Bilirubin Urine NEGATIVE NEGATIVE   Ketones, ur 5 (A) NEGATIVE mg/dL   Protein, ur NEGATIVE NEGATIVE mg/dL   Nitrite NEGATIVE NEGATIVE   Leukocytes,Ua SMALL (A) NEGATIVE   RBC / HPF 0-5 0 - 5 RBC/hpf   WBC, UA 6-10 0 - 5 WBC/hpf   Bacteria, UA NONE SEEN NONE SEEN   Squamous Epithelial / LPF 6-10 0 - 5   Mucus PRESENT     O/Positive/-- (03/09 1048)  Imaging:    MAU Course/MDM: I have ordered labs and reviewed results.  NST reviewed, reactive, category I with occasional contractions.   Treatments in MAU included EFM, fern test.    Assessment: Single intrauterine pregnancy at 58w2dVaginal bleeding, resolved Reactive fetal heart  rate tracing Prior Cesarean Deliveries Planned repeat C/S   Plan: Discharge home Bleeding precautions Labor precautions and fetal kick counts Follow up in Office for prenatal visits and recheck of status  Encouraged to return here or to other Urgent Care/ED if she develops worsening of symptoms, increase in pain, fever, or other concerning symptoms.   Pt stable at time of discharge.  MHansel FeinsteinCNM, MSN Certified Nurse-Midwife 10/05/2018 9:13 PM

## 2018-10-05 NOTE — Discharge Instructions (Signed)
Vaginal Bleeding During Pregnancy, Third Trimester ° °A small amount of bleeding from the vagina (spotting) is relatively common during pregnancy. Various things can cause bleeding or spotting during pregnancy. Sometimes bleeding is normal and is not a problem. However, bleeding during the third trimester can also be a sign of something serious for the mother and the baby. Be sure to tell your health care provider about any vaginal bleeding right away. °Some possible causes of vaginal bleeding during the third trimester include: °· Infection or growths (polyps) on the cervix. °· A condition in which the placenta partially or completely covers the opening of the cervix inside the uterus (placenta previa). °· The placenta separating from the uterus (placenta abruption). °· The start of labor (discharging of the mucus plug). °· A condition in which the placenta grows into the muscle layer of the uterus (placenta accreta). °Follow these instructions at home: °Activity °· Follow instructions from your health care provider about limiting your activity. If your health care provider recommends activity restriction, you may need to stay in bed and only get up to use the bathroom. In some cases, your health care provider may allow you to continue light activity. °· If needed, make plans for someone to help with your regular activities. °· Ask your health care provider if it is safe for you to drive. °· Do not lift anything that is heavier than 10 lb (4.5 kg), or the limit that your health care provider tells you, until he or she says that it is safe. °· Do not have sex or orgasms until your health care provider says that this is safe. °Medicines °· Take over-the-counter and prescription medicines only as told by your health care provider. °· Do not take aspirin because it can cause bleeding. °General instructions °· Pay attention to any changes in your symptoms. °· Write down how many pads you use each day, how often you  change pads, and how soaked (saturated) they are. °· Do not use tampons or douche. °· If you pass any tissue from your vagina, save the tissue so you can show it to your health care provider. °· Keep all follow-up visits as told by your health care provider. This is important. °Contact a health care provider if: °· You have vaginal bleeding during any part of your pregnancy. °· You have cramps or labor pains. °· You have a fever. °Get help right away if: °· You have severe cramps or pain in your back or abdomen. °· You have a gush of fluid from the vagina. °· You pass large clots or a large amount of tissue from your vagina. °· Your bleeding increases. °· You feel light-headed or weak. °· You faint. °· You feel that your baby is moving less than usual, or not moving at all. °Summary °· Various things can cause bleeding or spotting in pregnancy. °· Bleeding during the third trimester can be a sign of a serious problem for the mother and the baby. °· Be sure to tell your health care provider about any vaginal bleeding right away. °This information is not intended to replace advice given to you by your health care provider. Make sure you discuss any questions you have with your health care provider. °Document Released: 04/24/2002 Document Revised: 05/23/2018 Document Reviewed: 05/06/2016 °Elsevier Patient Education © 2020 [employer] ° °

## 2018-10-05 NOTE — MAU Note (Signed)
Pt reports to MAU c/o vaginal bleeding that started a few hours ago. Pt reports when she went to the BR she noticed a brown discharge in her underwear. Then she stated she passed a clot and some blood in the toilet. Pt reports the clot was quarter sized. Pt reports some pelvic pain. No ctx. No LOF. +FM.

## 2018-10-06 ENCOUNTER — Ambulatory Visit (HOSPITAL_COMMUNITY): Payer: Medicaid Other

## 2018-10-06 ENCOUNTER — Encounter (HOSPITAL_COMMUNITY): Payer: Self-pay

## 2018-10-06 ENCOUNTER — Ambulatory Visit (HOSPITAL_COMMUNITY)
Admission: RE | Admit: 2018-10-06 | Discharge: 2018-10-06 | Disposition: A | Discharge status: Home / Self Care | Payer: Medicaid Other | Source: Ambulatory Visit | Attending: Obstetrics and Gynecology | Admitting: Obstetrics and Gynecology

## 2018-10-09 ENCOUNTER — Encounter (HOSPITAL_COMMUNITY): Payer: Self-pay

## 2018-10-10 ENCOUNTER — Ambulatory Visit (INDEPENDENT_AMBULATORY_CARE_PROVIDER_SITE_OTHER): Payer: Medicaid Other | Admitting: Obstetrics

## 2018-10-10 ENCOUNTER — Encounter: Payer: Self-pay | Admitting: Obstetrics

## 2018-10-10 VITALS — BP 108/73

## 2018-10-10 DIAGNOSIS — O34219 Maternal care for unspecified type scar from previous cesarean delivery: Secondary | ICD-10-CM

## 2018-10-10 DIAGNOSIS — O0990 Supervision of high risk pregnancy, unspecified, unspecified trimester: Secondary | ICD-10-CM

## 2018-10-10 DIAGNOSIS — O24415 Gestational diabetes mellitus in pregnancy, controlled by oral hypoglycemic drugs: Secondary | ICD-10-CM

## 2018-10-10 DIAGNOSIS — O99213 Obesity complicating pregnancy, third trimester: Secondary | ICD-10-CM

## 2018-10-10 DIAGNOSIS — Z3A38 38 weeks gestation of pregnancy: Secondary | ICD-10-CM

## 2018-10-10 DIAGNOSIS — O99210 Obesity complicating pregnancy, unspecified trimester: Secondary | ICD-10-CM

## 2018-10-10 DIAGNOSIS — O0993 Supervision of high risk pregnancy, unspecified, third trimester: Secondary | ICD-10-CM

## 2018-10-10 NOTE — Progress Notes (Signed)
I connected with Cheryl Fischer on 10/10/18 at  2:30 PM EDT by telephone and verified that I am speaking with the correct person using two identifiers.   GDM ROB Webex: Fasting CBG 161  After eating  93 C/S already scheduled 10/17/2018

## 2018-10-10 NOTE — Progress Notes (Signed)
TELEHEALTH OBSTETRICS PRENATAL VIRTUAL VIDEO VISIT ENCOUNTER NOTE  Provider location: [facility] [place]   I connected with Cheryl Fischer on 10/10/18 at  2:30 PM EDT by WebEx OB MyChart Video Encounter at home and verified that I am speaking with the correct person using two identifiers.   I discussed the limitations, risks, security and privacy concerns of performing an evaluation and management service virtually and the availability of in person appointments. I also discussed with the patient that there may be a patient responsible charge related to this service. The patient expressed understanding and agreed to proceed. Subjective:  Cheryl Fischer is a 28 y.o. [phone] at [redacted]w[redacted]d being seen today for ongoing prenatal care.  She is currently monitored for the following issues for this high-risk pregnancy and has Supervision of other normal pregnancy, antepartum; History of cesarean delivery, currently pregnant; H/O multiple gestation; Elevated blood pressure reading without diagnosis of hypertension; Obesity during pregnancy; and Gestational diabetes mellitus (GDM) on their problem list.  Patient reports no complaints.  Contractions: Irregular. Vag. Bleeding: None.  Movement: Present. Denies any leaking of fluid.   The following portions of the patient's history were reviewed and updated as appropriate: allergies, current medications, past family history, past medical history, past social history, past surgical history and problem list.   Objective:   Vitals:   10/10/18 1431  BP: 108/73    Fetal Status:     Movement: Present     General:  Alert, oriented and cooperative. Patient is in no acute distress.  Respiratory: Normal respiratory effort, no problems with respiration noted  Mental Status: Normal mood and affect. Normal behavior. Normal judgment and thought content.  Rest of physical exam deferred due to type of encounter  Imaging: [place] Mfm Fetal Bpp Wo Non  Stress  Result Date: 10/03/2018 ----------------------------------------------------------------------  OBSTETRICS REPORT                    (Corrected Final 10/03/2018 02:43 pm) ---------------------------------------------------------------------- Patient Info  ID #:       [id number]                          D.O.B.:  August 27, 1990 (28 yrs)  Name:       Cheryl Fischer                      Visit Date: 09/29/2018 02:36 pm ---------------------------------------------------------------------- Performed By  Performed By:     Rennie Plowman          Ref. Address:     [address]                    RDMS                                                             Rd                                                             [place]  Attending:  Tama High MD        [facility]  Referred By:      Kathie Dike [place] ---------------------------------------------------------------------- Orders   #  Description                          Code         Ordered By   1  [place] MFM OB DETAIL +14 [place]              76811.01     Darrol Poke   2  [place] MFM FETAL BPP WO NON              76819.01     Sabine  ----------------------------------------------------------------------   #  Order #                    Accession #                 Episode #   1  [id number]                  [id number]                  [id number]   2  [id number]                  [id number]                  [id number]  ---------------------------------------------------------------------- Indications   [redacted] weeks gestation of pregnancy                Z3A.36   Encounter for antenatal screening for          Z36.3   malformations (NIPS, low risk female)   Gestational diabetes in pregnancy,             O24.419   unspecified control   Previous cesarean delivery x 4                 O34.219   Hypertension - Gestational                      Y70.6   Obesity complicating pregnancy (pregravid      O99.210 E66.9   BMI 38)  ---------------------------------------------------------------------- Vital Signs  Weight (lb): 242                               Height:        5'2"  BMI:         44.26 ---------------------------------------------------------------------- Fetal Evaluation  Num Of Fetuses:         1  Fetal Heart Rate(bpm):  136  Cardiac Activity:       Observed  Presentation:           Cephalic  Placenta:  Anterior  P. Cord Insertion:      Visualized, central  Amniotic Fluid  AFI FV:      Within normal limits  AFI Sum(cm)     %Tile       Largest Pocket(cm)  13.28           47          5.15  RUQ(cm)       RLQ(cm)       LUQ(cm)        LLQ(cm)  3             2.81          2.32           5.15 ---------------------------------------------------------------------- Biophysical Evaluation  Amniotic F.V:   Within normal limits       F. Tone:        Observed  F. Movement:    Observed                   Score:          8/8  F. Breathing:   Observed ---------------------------------------------------------------------- Biometry  BPD:      85.7  mm     G. Age:  34w 4d         13  %    CI:        68.98   %    70 - 86                                                          FL/HC:      21.3   %    20.1 - 22.1  HC:      329.6  mm     G. Age:  37w 4d         47  %    HC/AC:      1.01        0.93 - 1.11  AC:      325.6  mm     G. Age:  36w 3d         63  %    FL/BPD:     81.8   %    71 - 87  FL:       70.1  mm     G. Age:  36w 0d         34  %    FL/AC:      21.5   %    20 - 24  HUM:      62.1  mm     G. Age:  36w 0d         58  %  LV:        6.1  mm  Est. FW:    2884  gm      6 lb 6 oz     48  % ---------------------------------------------------------------------- OB History  Gravidity:    8         Term:   6        Prem:   1  Living:       7 ---------------------------------------------------------------------- Gestational Age  U/S Today:     36w 1d  EDD:   10/26/18  Best:          36w 3d     Det. ByMarcella Dubs         EDD:   10/24/18                                      (05/01/18) ---------------------------------------------------------------------- Anatomy  Cranium:               Appears normal         LVOT:                   Not well visualized  Cavum:                 Appears normal         Aortic Arch:            Not well visualized  Ventricles:            Appears normal         Ductal Arch:            Not well visualized  Choroid Plexus:        Appears normal         Diaphragm:              Appears normal  Cerebellum:            Appears normal         Stomach:                Appears normal, left                                                                        sided  Posterior Fossa:       Appears normal         Abdomen:                Appears normal  Nuchal Fold:           Not applicable (>20    Abdominal Wall:         Appears nml (cord                         wks GA)                                        insert, abd wall)  Face:                  Not well visualized    Cord Vessels:           Appears normal (3  vessel cord)  Lips:                  Not well visualized    Kidneys:                Appear normal  Palate:                Not well visualized    Bladder:                Appears normal  Thoracic:              Appears normal         Spine:                  Not well visualized  Heart:                 Not well visualized    Upper Extremities:      Right arm                                                                        Visualized, Lt arm                                                                        not vis.  RVOT:                  Not well visualized    Lower Extremities:      Visualized  Other:  Technically difficult due to advanced GA and fetal position. Feet          visualized. Rt hand visualized, lt hand unable to  visualize. Fetus          appears to be a female. ---------------------------------------------------------------------- Cervix Uterus Adnexa  Cervix  Not visualized (advanced GA >24wks)  Left Ovary  Not visualized.  Right Ovary  Within normal limits.  Adnexa  No abnormality visualized. ---------------------------------------------------------------------- Impression  Patient returned for fetal growth assessment. She has  gestational diabetes and has just started checking blood  glucose. She was advised to take metformin 500 mg at night.  Her blood pressure at your office (08/10) was 115/73 mm Hg.  Obstetric history is significant for previous 5 cesarean  deliveries.  Fetal growth is appropriate for gestational age. Amniotic fluid  is normal and good fetal activity is seen. Antenatal testing is  reassuring. BPP 8/8.  We reassured the patient of the findings. I emphasized the  importance of monitoring blood glucose and good control of  diabetes to prevent adverse fetal and neonatal outcomes. ---------------------------------------------------------------------- Recommendations  -Weekly BPP till delivery. ----------------------------------------------------------------------                       Noralee Space, MD Electronically Signed Corrected Final Report  10/03/2018 02:43 pm ----------------------------------------------------------------------  [place] Mfm Ob Detail +14 Wk  Result Date: 10/03/2018 ----------------------------------------------------------------------  OBSTETRICS REPORT                    (  Corrected Final 10/03/2018 02:43 pm) ---------------------------------------------------------------------- Patient Info  ID #:       [id number]                          D.O.B.:  Mar 27, 1990 (27 yrs)  Name:       Cheryl Fischer                      Visit Date: 09/29/2018 02:36 pm ---------------------------------------------------------------------- Performed By  Performed By:     Rennie Plowman          Ref. Address:      [address]                    RDMS                                                             Rd                                                             [place]  Attending:        Noralee Space MD        [facility]  Referred By:      Wilmer Floor LEFTWICH-                    Craige Cotta CNM ---------------------------------------------------------------------- Orders   #  Description                          Code         Ordered By   1  [place] MFM OB DETAIL +14 WK              76811.01     Steward Drone   2  [place] MFM FETAL BPP WO NON              76819.01     MYRA DOVE      STRESS  ----------------------------------------------------------------------   #  Order #                    Accession #                 Episode #   1  [id number]                  [id number]   2  [id number]  [id number]                  [id number]  ---------------------------------------------------------------------- Indications   [redacted] weeks gestation of pregnancy                Z3A.36   Encounter for antenatal screening for          Z36.3   malformations (NIPS, low risk female)   Gestational diabetes in pregnancy,             O24.419   unspecified control   Previous cesarean delivery x 4                 O34.219   Hypertension - Gestational                     O13.9   Obesity complicating pregnancy (pregravid      O99.210 E66.9   BMI 38)  ---------------------------------------------------------------------- Vital Signs  Weight (lb): 242                               Height:        5'2"  BMI:         44.26 ---------------------------------------------------------------------- Fetal Evaluation  Num Of Fetuses:         1  Fetal Heart Rate(bpm):  136  Cardiac Activity:       Observed  Presentation:           Cephalic  Placenta:               Anterior  P. Cord Insertion:      Visualized, central  Amniotic Fluid  AFI  FV:      Within normal limits  AFI Sum(cm)     %Tile       Largest Pocket(cm)  13.28           47          5.15  RUQ(cm)       RLQ(cm)       LUQ(cm)        LLQ(cm)  3             2.81          2.32           5.15 ---------------------------------------------------------------------- Biophysical Evaluation  Amniotic F.V:   Within normal limits       F. Tone:        Observed  F. Movement:    Observed                   Score:          8/8  F. Breathing:   Observed ---------------------------------------------------------------------- Biometry  BPD:      85.7  mm     G. Age:  34w 4d         13  %    CI:        68.98   %    70 - 86                                                          FL/HC:      21.3   %    20.1 - 22.1  HC:      329.6  mm     G. Age:  37w 4d         47  %    HC/AC:      1.01        0.93 - 1.11  AC:      325.6  mm     G. Age:  36w 3d         63  %    FL/BPD:     81.8   %    71 - 87  FL:       70.1  mm     G. Age:  36w 0d         34  %    FL/AC:      21.5   %    20 - 24  HUM:      62.1  mm     G. Age:  36w 0d         58  %  LV:        6.1  mm  Est. FW:    2884  gm      6 lb 6 oz     48  % ---------------------------------------------------------------------- OB History  Gravidity:    8         Term:   6        Prem:   1  Living:       7 ---------------------------------------------------------------------- Gestational Age  U/S Today:     36w 1d                                        EDD:   10/26/18  Best:          36w 3d     Det. ByMarcella Dubs         EDD:   10/24/18                                      (05/01/18) ---------------------------------------------------------------------- Anatomy  Cranium:               Appears normal         LVOT:                   Not well visualized  Cavum:                 Appears normal         Aortic Arch:            Not well visualized  Ventricles:            Appears normal         Ductal Arch:            Not well visualized  Choroid Plexus:        Appears  normal         Diaphragm:              Appears normal  Cerebellum:            Appears normal         Stomach:                Appears normal, left  sided  Posterior Fossa:       Appears normal         Abdomen:                Appears normal  Nuchal Fold:           Not applicable (>20    Abdominal Wall:         Appears nml (cord                         wks GA)                                        insert, abd wall)  Face:                  Not well visualized    Cord Vessels:           Appears normal (3                                                                        vessel cord)  Lips:                  Not well visualized    Kidneys:                Appear normal  Palate:                Not well visualized    Bladder:                Appears normal  Thoracic:              Appears normal         Spine:                  Not well visualized  Heart:                 Not well visualized    Upper Extremities:      Right arm                                                                        Visualized, Lt arm                                                                        not vis.  RVOT:                  Not well visualized    Lower Extremities:      Visualized  Other:  Technically difficult due  to advanced GA and fetal position. Feet          visualized. Rt hand visualized, lt hand unable to visualize. Fetus          appears to be a female. ---------------------------------------------------------------------- Cervix Uterus Adnexa  Cervix  Not visualized (advanced GA >24wks)  Left Ovary  Not visualized.  Right Ovary  Within normal limits.  Adnexa  No abnormality visualized. ---------------------------------------------------------------------- Impression  Patient returned for fetal growth assessment. She has  gestational diabetes and has just started checking blood  glucose. She was advised to take metformin 500 mg at night.  Her blood  pressure at your office (08/10) was 115/73 mm Hg.  Obstetric history is significant for previous 5 cesarean  deliveries.  Fetal growth is appropriate for gestational age. Amniotic fluid  is normal and good fetal activity is seen. Antenatal testing is  reassuring. BPP 8/8.  We reassured the patient of the findings. I emphasized the  importance of monitoring blood glucose and good control of  diabetes to prevent adverse fetal and neonatal outcomes. ---------------------------------------------------------------------- Recommendations  -Weekly BPP till delivery. ----------------------------------------------------------------------                       Noralee Space, MD Electronically Signed Corrected Final Report  10/03/2018 02:43 pm ----------------------------------------------------------------------   Assessment and Plan:  Pregnancy: [id number] at [redacted]w[redacted]d 1. Supervision of high risk pregnancy, antepartum  2. Gestational diabetes mellitus (GDM) in third trimester controlled on oral hypoglycemic drug - good glucose until today, with FBS of 161 and PP of 93  3. Previous C/S.  Repeat C/S scheduled for 39 weeks    Term labor symptoms and general obstetric precautions including but not limited to vaginal bleeding, contractions, leaking of fluid and fetal movement were reviewed in detail with the patient. I discussed the assessment and treatment plan with the patient. The patient was provided an opportunity to ask questions and all were answered. The patient agreed with the plan and demonstrated an understanding of the instructions. The patient was advised to call back or seek an in-person office evaluation/go to [facility] at [facility] for any urgent or concerning symptoms. Please refer to After Visit Summary for other counseling recommendations.   I provided 10 minutes of face-to-face time during this encounter.  Return in about 3 weeks (around 10/31/2018) for Postpartum care after  C/S.  Future Appointments  Date Time Provider Department Center  10/13/2018  2:15 PM [facility] NURSE WH-MFC MFC-US  10/13/2018  2:15 PM WH-MFC [place] 4 WH-MFCUS MFC-US  10/15/2018  8:30 AM MC-MAU 1 MC-INDC None    Coral Ceo, MD [facility] for [facility], [facility] 10/10/2018

## 2018-10-12 ENCOUNTER — Inpatient Hospital Stay (HOSPITAL_COMMUNITY): Payer: Medicaid Other

## 2018-10-12 ENCOUNTER — Encounter (HOSPITAL_COMMUNITY): Payer: Self-pay | Admitting: Anesthesiology

## 2018-10-12 ENCOUNTER — Inpatient Hospital Stay (HOSPITAL_COMMUNITY): Payer: Medicaid Other | Admitting: Anesthesiology

## 2018-10-12 ENCOUNTER — Encounter (HOSPITAL_COMMUNITY)
Admission: AD | Disposition: A | Discharge status: Home / Self Care | Payer: Self-pay | Source: Home / Self Care | Attending: Family Medicine

## 2018-10-12 ENCOUNTER — Inpatient Hospital Stay (HOSPITAL_COMMUNITY)
Admission: AD | Admit: 2018-10-12 | Discharge: 2018-10-15 | DRG: 786 | Disposition: A | Discharge status: Home / Self Care | Payer: Medicaid Other | Attending: Family Medicine | Admitting: Family Medicine

## 2018-10-12 DIAGNOSIS — R109 Unspecified abdominal pain: Secondary | ICD-10-CM | POA: Diagnosis present

## 2018-10-12 DIAGNOSIS — O34211 Maternal care for low transverse scar from previous cesarean delivery: Secondary | ICD-10-CM | POA: Diagnosis present

## 2018-10-12 DIAGNOSIS — J9690 Respiratory failure, unspecified, unspecified whether with hypoxia or hypercapnia: Secondary | ICD-10-CM

## 2018-10-12 DIAGNOSIS — I469 Cardiac arrest, cause unspecified: Secondary | ICD-10-CM | POA: Diagnosis not present

## 2018-10-12 DIAGNOSIS — O133 Gestational [pregnancy-induced] hypertension without significant proteinuria, third trimester: Secondary | ICD-10-CM

## 2018-10-12 DIAGNOSIS — Z8674 Personal history of sudden cardiac arrest: Secondary | ICD-10-CM

## 2018-10-12 DIAGNOSIS — G931 Anoxic brain damage, not elsewhere classified: Secondary | ICD-10-CM | POA: Diagnosis not present

## 2018-10-12 DIAGNOSIS — IMO0002 Reserved for concepts with insufficient information to code with codable children: Secondary | ICD-10-CM

## 2018-10-12 DIAGNOSIS — R578 Other shock: Secondary | ICD-10-CM

## 2018-10-12 DIAGNOSIS — O9902 Anemia complicating childbirth: Secondary | ICD-10-CM | POA: Diagnosis present

## 2018-10-12 DIAGNOSIS — R918 Other nonspecific abnormal finding of lung field: Secondary | ICD-10-CM | POA: Diagnosis not present

## 2018-10-12 DIAGNOSIS — E875 Hyperkalemia: Secondary | ICD-10-CM | POA: Diagnosis present

## 2018-10-12 DIAGNOSIS — R0902 Hypoxemia: Secondary | ICD-10-CM

## 2018-10-12 DIAGNOSIS — G253 Myoclonus: Secondary | ICD-10-CM | POA: Diagnosis present

## 2018-10-12 DIAGNOSIS — J9601 Acute respiratory failure with hypoxia: Secondary | ICD-10-CM | POA: Diagnosis not present

## 2018-10-12 DIAGNOSIS — Z880 Allergy status to penicillin: Secondary | ICD-10-CM

## 2018-10-12 DIAGNOSIS — O24425 Gestational diabetes mellitus in childbirth, controlled by oral hypoglycemic drugs: Secondary | ICD-10-CM | POA: Diagnosis present

## 2018-10-12 DIAGNOSIS — O24419 Gestational diabetes mellitus in pregnancy, unspecified control: Secondary | ICD-10-CM

## 2018-10-12 DIAGNOSIS — O9989 Other specified diseases and conditions complicating pregnancy, childbirth and the puerperium: Secondary | ICD-10-CM | POA: Diagnosis present

## 2018-10-12 DIAGNOSIS — O34212 Maternal care for vertical scar from previous cesarean delivery: Secondary | ICD-10-CM

## 2018-10-12 DIAGNOSIS — O34219 Maternal care for unspecified type scar from previous cesarean delivery: Secondary | ICD-10-CM

## 2018-10-12 DIAGNOSIS — Z98891 History of uterine scar from previous surgery: Secondary | ICD-10-CM

## 2018-10-12 DIAGNOSIS — Z3A38 38 weeks gestation of pregnancy: Secondary | ICD-10-CM

## 2018-10-12 DIAGNOSIS — O1205 Gestational edema, complicating the puerperium: Secondary | ICD-10-CM | POA: Diagnosis present

## 2018-10-12 DIAGNOSIS — O364XX0 Maternal care for intrauterine death, not applicable or unspecified: Secondary | ICD-10-CM

## 2018-10-12 DIAGNOSIS — O99284 Endocrine, nutritional and metabolic diseases complicating childbirth: Secondary | ICD-10-CM | POA: Diagnosis present

## 2018-10-12 DIAGNOSIS — O99213 Obesity complicating pregnancy, third trimester: Secondary | ICD-10-CM | POA: Diagnosis not present

## 2018-10-12 DIAGNOSIS — D62 Acute posthemorrhagic anemia: Secondary | ICD-10-CM | POA: Diagnosis present

## 2018-10-12 DIAGNOSIS — I82409 Acute embolism and thrombosis of unspecified deep veins of unspecified lower extremity: Secondary | ICD-10-CM | POA: Diagnosis not present

## 2018-10-12 DIAGNOSIS — R4182 Altered mental status, unspecified: Secondary | ICD-10-CM | POA: Diagnosis present

## 2018-10-12 DIAGNOSIS — Z20828 Contact with and (suspected) exposure to other viral communicable diseases: Secondary | ICD-10-CM | POA: Diagnosis present

## 2018-10-12 DIAGNOSIS — R2232 Localized swelling, mass and lump, left upper limb: Secondary | ICD-10-CM | POA: Diagnosis not present

## 2018-10-12 DIAGNOSIS — S3769XA Other injury of uterus, initial encounter: Secondary | ICD-10-CM

## 2018-10-12 DIAGNOSIS — O9942 Diseases of the circulatory system complicating childbirth: Secondary | ICD-10-CM | POA: Diagnosis present

## 2018-10-12 LAB — CBC
HCT: 33.9 % — ABNORMAL LOW (ref 36.0–46.0)
HCT: 40.8 % (ref 36.0–46.0)
Hemoglobin: 11.2 g/dL — ABNORMAL LOW (ref 12.0–15.0)
Hemoglobin: 13.8 g/dL (ref 12.0–15.0)
MCH: 32.6 pg (ref 26.0–34.0)
MCH: 31.4 pg (ref 26.0–34.0)
MCHC: 33 g/dL (ref 30.0–36.0)
MCHC: 33.8 g/dL (ref 30.0–36.0)
MCV: 98.5 fL (ref 80.0–100.0)
MCV: 92.7 fL (ref 80.0–100.0)
Platelets: 230 10*3/uL (ref 150–400)
Platelets: 167 10*3/uL (ref 150–400)
RBC: 3.44 MIL/uL — ABNORMAL LOW (ref 3.87–5.11)
RBC: 4.4 MIL/uL (ref 3.87–5.11)
RDW: 13.4 % (ref 11.5–15.5)
RDW: 13.9 % (ref 11.5–15.5)
WBC: 38.3 10*3/uL — ABNORMAL HIGH (ref 4.0–10.5)
WBC: 25.4 10*3/uL — ABNORMAL HIGH (ref 4.0–10.5)
nRBC: 0.2 % (ref 0.0–0.2)
nRBC: 0.2 % (ref 0.0–0.2)

## 2018-10-12 LAB — COMPREHENSIVE METABOLIC PANEL
ALT: 32 U/L (ref 0–44)
ALT: 40 U/L (ref 0–44)
AST: 44 U/L — ABNORMAL HIGH (ref 15–41)
AST: 64 U/L — ABNORMAL HIGH (ref 15–41)
Albumin: 1.5 g/dL — ABNORMAL LOW (ref 3.5–5.0)
Albumin: 3.2 g/dL — ABNORMAL LOW (ref 3.5–5.0)
Alkaline Phosphatase: 110 U/L (ref 38–126)
Alkaline Phosphatase: 107 U/L (ref 38–126)
Anion gap: 14 (ref 5–15)
Anion gap: 15 (ref 5–15)
BUN: 12 mg/dL (ref 6–20)
BUN: 12 mg/dL (ref 6–20)
CO2: 20 mmol/L — ABNORMAL LOW (ref 22–32)
CO2: 18 mmol/L — ABNORMAL LOW (ref 22–32)
Calcium: 9.2 mg/dL (ref 8.9–10.3)
Calcium: 8.6 mg/dL — ABNORMAL LOW (ref 8.9–10.3)
Chloride: 104 mmol/L (ref 98–111)
Chloride: 103 mmol/L (ref 98–111)
Creatinine, Ser: 1.19 mg/dL — ABNORMAL HIGH (ref 0.44–1.00)
Creatinine, Ser: 0.98 mg/dL (ref 0.44–1.00)
GFR calc Af Amer: >60 mL/min (ref >60)
GFR calc Af Amer: >60 mL/min (ref >60)
GFR calc non Af Amer: >60 mL/min (ref >60)
GFR calc non Af Amer: >60 mL/min (ref >60)
Glucose, Bld: 284 mg/dL — ABNORMAL HIGH (ref 70–99)
Glucose, Bld: 269 mg/dL — ABNORMAL HIGH (ref 70–99)
Potassium: 4.9 mmol/L (ref 3.5–5.1)
Potassium: 6.1 mmol/L — ABNORMAL HIGH (ref 3.5–5.1)
Sodium: 138 mmol/L (ref 135–145)
Sodium: 136 mmol/L (ref 135–145)
Total Bilirubin: 0.5 mg/dL (ref 0.3–1.2)
Total Bilirubin: 2 mg/dL — ABNORMAL HIGH (ref 0.3–1.2)
Total Protein: 3.4 g/dL — ABNORMAL LOW (ref 6.5–8.1)
Total Protein: 5.7 g/dL — ABNORMAL LOW (ref 6.5–8.1)

## 2018-10-12 LAB — DIC (DISSEMINATED INTRAVASCULAR COAGULATION)PANEL
D-Dimer, Quant: >20 ug/mL-FEU — ABNORMAL HIGH (ref 0.00–0.50)
D-Dimer, Quant: >20 ug/mL-FEU — ABNORMAL HIGH (ref 0.00–0.50)
Fibrinogen: 188 mg/dL — ABNORMAL LOW (ref 210–475)
Fibrinogen: 238 mg/dL (ref 210–475)
INR: 1.3 — ABNORMAL HIGH (ref 0.8–1.2)
INR: 1.3 — ABNORMAL HIGH (ref 0.8–1.2)
Platelets: 233 10*3/uL (ref 150–400)
Platelets: 168 10*3/uL (ref 150–400)
Prothrombin Time: 16.3 seconds — ABNORMAL HIGH (ref 11.4–15.2)
Prothrombin Time: 15.9 seconds — ABNORMAL HIGH (ref 11.4–15.2)
Smear Review: NONE SEEN
Smear Review: NONE SEEN
aPTT: 38 seconds — ABNORMAL HIGH (ref 24–36)
aPTT: 36 seconds (ref 24–36)

## 2018-10-12 LAB — ABO/RH: ABO/RH(D): O POS

## 2018-10-12 LAB — POCT I-STAT 7, (LYTES, BLD GAS, ICA,H+H)
Acid-base deficit: 3 mmol/L — ABNORMAL HIGH (ref 0.0–2.0)
Bicarbonate: 23.6 mmol/L (ref 20.0–28.0)
Calcium, Ion: 1.06 mmol/L — ABNORMAL LOW (ref 1.15–1.40)
HCT: 42 % (ref 36.0–46.0)
Hemoglobin: 14.3 g/dL (ref 12.0–15.0)
O2 Saturation: 100 %
Patient temperature: 94.1
Potassium: 3.8 mmol/L (ref 3.5–5.1)
Sodium: 137 mmol/L (ref 135–145)
TCO2: 25 mmol/L (ref 22–32)
pCO2 arterial: 43.5 mmHg (ref 32.0–48.0)
pH, Arterial: 7.329 — ABNORMAL LOW (ref 7.350–7.450)
pO2, Arterial: 271 mmHg — ABNORMAL HIGH (ref 83.0–108.0)

## 2018-10-12 LAB — MASSIVE TRANSFUSION PROTOCOL ORDER (BLOOD BANK NOTIFICATION)

## 2018-10-12 LAB — GLUCOSE, CAPILLARY: Glucose-Capillary: 244 mg/dL — ABNORMAL HIGH (ref 70–99)

## 2018-10-12 LAB — LACTATE DEHYDROGENASE: LDH: 445 U/L — ABNORMAL HIGH (ref 98–192)

## 2018-10-12 LAB — POSTPARTUM HEMORRHAGE PROTOCOL (BB NOTIFICATION)

## 2018-10-12 SURGERY — Surgical Case
Anesthesia: General | Site: Abdomen | Wound class: Clean Contaminated

## 2018-10-12 MED ORDER — PROPOFOL 10 MG/ML IV BOLUS
INTRAVENOUS | Status: DC | PRN
  Administered 2018-10-12: 200 mg via INTRAVENOUS

## 2018-10-12 MED ORDER — DEXTROSE 50 % IV SOLN
50.0000 mL | Freq: Once | INTRAVENOUS | Status: AC
  Administered 2018-10-12: 50 mL via INTRAVENOUS
  Filled 2018-10-12: qty 50

## 2018-10-12 MED ORDER — LORAZEPAM 2 MG/ML IJ SOLN
2.0000 mg | Freq: Once | INTRAMUSCULAR | Status: AC
  Administered 2018-10-12: 23:00:00 2 mg via INTRAVENOUS

## 2018-10-12 MED ORDER — MIDAZOLAM 50MG/50ML (1MG/ML) PREMIX INFUSION
1.0000 mg/h | INTRAVENOUS | Status: DC
  Administered 2018-10-13: 1 mg/h via INTRAVENOUS

## 2018-10-12 MED ORDER — LEVETIRACETAM IN NACL 500 MG/100ML IV SOLN
500.0000 mg | Freq: Once | INTRAVENOUS | Status: DC
  Filled 2018-10-12: qty 100

## 2018-10-12 MED ORDER — CHLORHEXIDINE GLUCONATE CLOTH 2 % EX PADS: 6.0000 | MEDICATED_PAD | Freq: Every day | CUTANEOUS | Status: DC

## 2018-10-12 MED ORDER — FENTANYL BOLUS VIA INFUSION
50.0000 ug | INTRAVENOUS | Status: DC | PRN
  Administered 2018-10-13 (×2): 50 ug via INTRAVENOUS
  Filled 2018-10-12: qty 50

## 2018-10-12 MED ORDER — LACTATED RINGERS IV SOLN
INTRAVENOUS | Status: DC | PRN
  Administered 2018-10-12 (×2): via INTRAVENOUS

## 2018-10-12 MED ORDER — LACTATED RINGERS IV SOLN
INTRAVENOUS | Status: DC
  Administered 2018-10-13: 04:00:00 via INTRAVENOUS

## 2018-10-12 MED ORDER — PROPOFOL 10 MG/ML IV BOLUS
INTRAVENOUS | Status: AC
  Filled 2018-10-12: qty 20

## 2018-10-12 MED ORDER — PROPOFOL 1000 MG/100ML IV EMUL
5.0000 ug/kg/min | INTRAVENOUS | Status: DC
  Administered 2018-10-12 – 2018-10-13 (×2): 20 ug/kg/min via INTRAVENOUS
  Filled 2018-10-12: qty 100

## 2018-10-12 MED ORDER — FENTANYL 2500MCG IN NS 250ML (10MCG/ML) PREMIX INFUSION
50.0000 ug/h | INTRAVENOUS | Status: DC
  Administered 2018-10-12: 50 ug/h via INTRAVENOUS
  Filled 2018-10-12: qty 250

## 2018-10-12 MED ORDER — LEVETIRACETAM IN NACL 1500 MG/100ML IV SOLN
1500.0000 mg | Freq: Once | INTRAVENOUS | Status: AC
  Administered 2018-10-12: 1500 mg via INTRAVENOUS
  Filled 2018-10-12: qty 100

## 2018-10-12 MED ORDER — MIDAZOLAM HCL 2 MG/2ML IJ SOLN
INTRAMUSCULAR | Status: DC | PRN
  Administered 2018-10-12 (×2): 2 mg via INTRAVENOUS

## 2018-10-12 MED ORDER — BUPIVACAINE HCL (PF) 0.5 % IJ SOLN
INTRAMUSCULAR | Status: DC | PRN
  Administered 2018-10-12: 30 mL

## 2018-10-12 MED ORDER — SUCCINYLCHOLINE CHLORIDE 200 MG/10ML IV SOSY
PREFILLED_SYRINGE | INTRAVENOUS | Status: AC
  Filled 2018-10-12: qty 10

## 2018-10-12 MED ORDER — PHENYLEPHRINE 40 MCG/ML (10ML) SYRINGE FOR IV PUSH (FOR BLOOD PRESSURE SUPPORT)
PREFILLED_SYRINGE | INTRAVENOUS | Status: AC
  Filled 2018-10-12: qty 10

## 2018-10-12 MED ORDER — ONDANSETRON HCL 4 MG/2ML IJ SOLN
INTRAMUSCULAR | Status: AC
  Administered 2018-10-12: 4 mg via INTRAVENOUS
  Filled 2018-10-12: qty 2

## 2018-10-12 MED ORDER — EPINEPHRINE 1 MG/10ML IJ SOSY
PREFILLED_SYRINGE | INTRAMUSCULAR | Status: AC
  Filled 2018-10-12: qty 10

## 2018-10-12 MED ORDER — FENTANYL CITRATE (PF) 100 MCG/2ML IJ SOLN: INTRAMUSCULAR | Status: DC | PRN

## 2018-10-12 MED ORDER — SODIUM CHLORIDE 0.9 % IV SOLN
INTRAVENOUS | Status: DC | PRN
  Administered 2018-10-12: 40 [IU] via INTRAVENOUS

## 2018-10-12 MED ORDER — MIDAZOLAM BOLUS VIA INFUSION
1.0000 mg | INTRAVENOUS | Status: DC | PRN
  Filled 2018-10-12: qty 2

## 2018-10-12 MED ORDER — FUROSEMIDE 10 MG/ML IJ SOLN
40.0000 mg | Freq: Once | INTRAMUSCULAR | Status: AC
  Administered 2018-10-12: 23:00:00 40 mg via INTRAVENOUS
  Filled 2018-10-12: qty 4

## 2018-10-12 MED ORDER — CEFAZOLIN SODIUM-DEXTROSE 2-3 GM-%(50ML) IV SOLR: INTRAVENOUS | Status: DC | PRN

## 2018-10-12 MED ORDER — ALBUMIN HUMAN 5 % IV SOLN
INTRAVENOUS | Status: AC
  Filled 2018-10-12: qty 250

## 2018-10-12 MED ORDER — ROCURONIUM BROMIDE 10 MG/ML (PF) SYRINGE
PREFILLED_SYRINGE | INTRAVENOUS | Status: AC
  Filled 2018-10-12: qty 10

## 2018-10-12 MED ORDER — SUCCINYLCHOLINE CHLORIDE 20 MG/ML IJ SOLN
INTRAMUSCULAR | Status: DC | PRN
  Administered 2018-10-12: 100 mg via INTRAVENOUS

## 2018-10-12 MED ORDER — MIDAZOLAM HCL 2 MG/2ML IJ SOLN
INTRAMUSCULAR | Status: AC
  Filled 2018-10-12: qty 2

## 2018-10-12 MED ORDER — SODIUM BICARBONATE 8.4 % IV SOLN
100.0000 meq | Freq: Once | INTRAVENOUS | Status: AC
  Administered 2018-10-12: 22:00:00 100 meq via INTRAVENOUS

## 2018-10-12 MED ORDER — PROPOFOL 1000 MG/100ML IV EMUL
INTRAVENOUS | Status: AC
  Filled 2018-10-12: qty 100

## 2018-10-12 MED ORDER — FENTANYL CITRATE (PF) 250 MCG/5ML IJ SOLN
INTRAMUSCULAR | Status: AC
  Filled 2018-10-12: qty 5

## 2018-10-12 MED ORDER — FENTANYL CITRATE (PF) 100 MCG/2ML IJ SOLN
INTRAMUSCULAR | Status: DC | PRN
  Administered 2018-10-12 (×2): 250 ug via INTRAVENOUS

## 2018-10-12 MED ORDER — EPINEPHRINE PF 1 MG/ML IJ SOLN
INTRAMUSCULAR | Status: DC | PRN
  Administered 2018-10-12 (×2): 1 mg via INTRAVENOUS

## 2018-10-12 MED ORDER — CALCIUM GLUCONATE 10 % IV SOLN
INTRAVENOUS | Status: DC | PRN
  Administered 2018-10-12: 1 g via INTRAVENOUS

## 2018-10-12 MED ORDER — SODIUM CHLORIDE 0.9% IV SOLUTION: Freq: Once | INTRAVENOUS | Status: DC

## 2018-10-12 MED ORDER — SUFENTANIL CITRATE 50 MCG/ML IV SOLN: INTRAVENOUS | Status: DC | PRN

## 2018-10-12 MED ORDER — LORAZEPAM 2 MG/ML IJ SOLN
INTRAMUSCULAR | Status: AC
  Administered 2018-10-12: 23:00:00 2 mg via INTRAVENOUS
  Filled 2018-10-12: qty 1

## 2018-10-12 MED ORDER — ONDANSETRON HCL 4 MG/2ML IJ SOLN
4.0000 mg | Freq: Four times a day (QID) | INTRAMUSCULAR | Status: DC | PRN
  Administered 2018-10-12 – 2018-10-13 (×2): 4 mg via INTRAVENOUS
  Filled 2018-10-12: qty 2

## 2018-10-12 MED ORDER — INSULIN ASPART 100 UNIT/ML ~~LOC~~ SOLN: 0.0000 [IU] | SUBCUTANEOUS | Status: DC

## 2018-10-12 MED ORDER — LEVETIRACETAM IN NACL 1000 MG/100ML IV SOLN
1000.0000 mg | Freq: Once | INTRAVENOUS | Status: DC
  Administered 2018-10-12: 23:00:00 1000 mg via INTRAVENOUS
  Filled 2018-10-12: qty 100

## 2018-10-12 MED ORDER — SODIUM CHLORIDE 0.9 % IV SOLN
INTRAVENOUS | Status: DC | PRN
  Administered 2018-10-12: 20:00:00 via INTRAVENOUS
  Administered 2018-10-13: 250 mL

## 2018-10-12 MED ORDER — PHENYLEPHRINE HCL (PRESSORS) 10 MG/ML IV SOLN
INTRAVENOUS | Status: DC | PRN
  Administered 2018-10-12 (×2): 400 ug via INTRAVENOUS

## 2018-10-12 MED ORDER — OXYTOCIN 40 UNITS IN NORMAL SALINE INFUSION - SIMPLE MED
2.5000 [IU]/h | INTRAVENOUS | Status: AC
  Administered 2018-10-12: 2.5 [IU]/h via INTRAVENOUS
  Filled 2018-10-12 (×2): qty 1000

## 2018-10-12 MED ORDER — ROCURONIUM BROMIDE 100 MG/10ML IV SOLN
INTRAVENOUS | Status: DC | PRN
  Administered 2018-10-12: 30 mg via INTRAVENOUS

## 2018-10-12 MED ORDER — ROCURONIUM BROMIDE 100 MG/10ML IV SOLN: INTRAVENOUS | Status: DC | PRN

## 2018-10-12 MED ORDER — MIDAZOLAM 50MG/50ML (1MG/ML) PREMIX INFUSION
0.0000 mg/h | INTRAVENOUS | Status: DC
  Administered 2018-10-12: 23:00:00 2 mg/h via INTRAVENOUS
  Filled 2018-10-12: qty 50

## 2018-10-12 MED ORDER — LACTATED RINGERS IV BOLUS: 1000.0000 mL | Freq: Once | INTRAVENOUS | Status: DC

## 2018-10-12 MED ORDER — CEFAZOLIN SODIUM-DEXTROSE 2-4 GM/100ML-% IV SOLN
INTRAVENOUS | Status: AC
  Filled 2018-10-12: qty 100

## 2018-10-12 MED ORDER — BUPIVACAINE HCL (PF) 0.25 % IJ SOLN: INTRAMUSCULAR | Status: DC | PRN

## 2018-10-12 MED ORDER — FENTANYL CITRATE (PF) 100 MCG/2ML IJ SOLN: 50.0000 ug | Freq: Once | INTRAMUSCULAR | Status: DC

## 2018-10-12 MED ORDER — INSULIN ASPART 100 UNIT/ML ~~LOC~~ SOLN
10.0000 [IU] | Freq: Once | SUBCUTANEOUS | Status: AC
  Administered 2018-10-12: 23:00:00 10 [IU] via INTRAVENOUS

## 2018-10-12 SURGICAL SUPPLY — 36 items
BENZOIN TINCTURE PRP APPL 2/3 (GAUZE/BANDAGES/DRESSINGS) ×2 IMPLANT
CHLORAPREP W/TINT 26ML (MISCELLANEOUS) ×2 IMPLANT
CLAMP CORD UMBIL (MISCELLANEOUS) IMPLANT
CLOTH BEACON ORANGE TIMEOUT ST (SAFETY) ×2 IMPLANT
DRSG OPSITE POSTOP 4X10 (GAUZE/BANDAGES/DRESSINGS) ×2 IMPLANT
ELECT REM PT RETURN 9FT ADLT (ELECTROSURGICAL) ×2
ELECTRODE REM PT RTRN 9FT ADLT (ELECTROSURGICAL) ×1 IMPLANT
EXTRACTOR VACUUM M CUP 4 TUBE (SUCTIONS) IMPLANT
GLOVE BIOGEL PI IND STRL 7.0 (GLOVE) ×2 IMPLANT
GLOVE BIOGEL PI INDICATOR 7.0 (GLOVE) ×2
GLOVE ECLIPSE 7.0 STRL STRAW (GLOVE) ×4 IMPLANT
GOWN STRL REUS W/TWL LRG LVL3 (GOWN DISPOSABLE) ×4 IMPLANT
KIT ABG SYR 3ML LUER SLIP (SYRINGE) IMPLANT
NDL HYPO 25X5/8 SAFETYGLIDE (NEEDLE) IMPLANT
NEEDLE HYPO 22GX1.5 SAFETY (NEEDLE) ×2 IMPLANT
NEEDLE HYPO 25X5/8 SAFETYGLIDE (NEEDLE) IMPLANT
NS IRRIG 1000ML POUR BTL (IV SOLUTION) ×2 IMPLANT
PACK C SECTION WH (CUSTOM PROCEDURE TRAY) ×2 IMPLANT
PAD ABD 7.5X8 STRL (GAUZE/BANDAGES/DRESSINGS) ×2 IMPLANT
PAD ABD DERMACEA PRESS 5X9 (GAUZE/BANDAGES/DRESSINGS) ×1 IMPLANT
PAD OB MATERNITY 4.3X12.25 (PERSONAL CARE ITEMS) ×2 IMPLANT
PENCIL SMOKE EVAC W/HOLSTER (ELECTROSURGICAL) ×2 IMPLANT
RTRCTR C-SECT PINK 25CM LRG (MISCELLANEOUS) ×2 IMPLANT
SPONGE GAUZE 4X4 12PLY STER LF (GAUZE/BANDAGES/DRESSINGS) ×2 IMPLANT
STRIP CLOSURE SKIN 1/2X4 (GAUZE/BANDAGES/DRESSINGS) ×2 IMPLANT
SUT MON AB-0 CT1 36 (SUTURE) ×1 IMPLANT
SUT PDS AB 0 CTX 60 (SUTURE) ×1 IMPLANT
SUT PDS AB 1 CTX 36 (SUTURE) ×1 IMPLANT
SUT PLAIN 2 0 XLH (SUTURE) ×1 IMPLANT
SUT VIC AB 0 CTX 36 (SUTURE) ×3
SUT VIC AB 0 CTX36XBRD ANBCTRL (SUTURE) ×3 IMPLANT
SUT VIC AB 4-0 KS 27 (SUTURE) ×2 IMPLANT
SYR 30ML LL (SYRINGE) ×2 IMPLANT
TOWEL OR 17X24 6PK STRL BLUE (TOWEL DISPOSABLE) ×2 IMPLANT
TRAY FOLEY W/BAG SLVR 14FR LF (SET/KITS/TRAYS/PACK) ×2 IMPLANT
WATER STERILE IRR 1000ML POUR (IV SOLUTION) ×2 IMPLANT

## 2018-10-12 NOTE — Discharge Summary (Signed)
Postpartum Discharge Summary     Patient Name: Cheryl Fischer DOB: 1990/03/13 MRN: [id number]  Date of admission: 10/12/2018 Delivering Provider: Donnamae Jude   Date of discharge: 10/15/2018  Admitting diagnosis: preg. hypotension Intrauterine pregnancy: [redacted]w[redacted]d     Secondary diagnosis:  Active Problems:   Hemorrhagic shock ([place])   Uterine rupture   Neonatal death   Acute blood loss anemia   Hypoxia   Respiratory failure ([place])   Cardiac arrest ([place])   Status post cesarean section  Additional problems: GHTN, GDM, Prior C-section x 4     Discharge diagnosis: Anemia, PPH and uterine rupture, CPR following code blue                                                                                                Post partum procedures:blood transfusion ventilator, sedation  Complications: [id number], Uterine Rupture and Stillborn   Consultations: CCM, Neurology  Hospital course:  Scheduled C/S   28 y.o. yo [id number] at [redacted]w[redacted]d was admitted to the hospital 10/12/2018 for abdominal pain and tachycardia with fetal bradycardia and taken to OR emergently for presumed uterine rupture with the following indication:Uterine Rupture Membrane Rupture Time/Date:  ,   Patient delivered a Non Viable infant.10/12/2018. Uterus ruptured, 4.8 L blood loss and Coded in the OR. Got Central line, A-line in OR. Given 7 u PRBCs, 1 u plts, 5 of FFP and 2 Albumin. Postoperatively, intubated and taken to the ICU. More products given. On sedation, with some myoclonic movement and nystagmus, Neuro consult ordered. Then held sedation and woke up, and nodded purposefully. Details of operation can be found in separate operative note.  Pateint had a complicated postoperative course.She was seen by neurology after having a seizure that was not thought to be due to eclampsia. Keppra was prescribed and she was placed on a short taper. She was transferred from the ICU to [facility] specialty care PP day 2. She tolerated a general  diet and was ambulating and doing well POD 3. No more seizures and EEG was negative. She had left hand swelling and vascular compromise was ruled out and this responded to ice and elevation      Magnesium Sulfate received: No BMZ received: No  Physical exam  Vitals:   10/14/18 2344 10/15/18 0457 10/15/18 0500 10/15/18 0845  BP: 107/62 96/78 96/78  101/62  Pulse: 68 64 95 65  Resp: 20 19  18   Temp: 98.5 F (36.9 C) 97.8 F (36.6 C)  97.7 F (36.5 C)  TempSrc: Oral Oral  Oral  SpO2: 95% 97%  98%  Weight:      Blood pressure 101/62, pulse 65, temperature 97.7 F (36.5 C), temperature source Oral, resp. rate 18, weight 113.6 kg, last menstrual period 02/10/2018, SpO2 98 %.  General: alert, cooperative and no distress Lochia: appropriate Uterine Fundus: firm Incision: Healing well with no significant drainage, Dressing is clean, dry, and intact DVT Evaluation: No evidence of DVT seen on physical exam. Swelling left hand subjectively improved, good color Labs: Lab Results  Component Value Date   WBC 10.2 10/14/2018   HGB 10.7 (  L) 10/14/2018   HCT 31.2 (L) 10/14/2018   MCV 88.6 10/14/2018   PLT 119 (L) 10/14/2018   CMP Latest Ref Rng & Units 10/14/2018  Glucose 70 - 99 mg/dL 93  BUN 6 - 20 mg/dL 7  Creatinine 1.610.44 - 0.961.00 mg/dL 0.450.78  Sodium 409135 - 811145 mmol/L 137  Potassium 3.5 - 5.1 mmol/L 3.5  Chloride 98 - 111 mmol/L 104  CO2 22 - 32 mmol/L 25  Calcium 8.9 - 10.3 mg/dL 8.3(L)  Total Protein 6.5 - 8.1 g/dL 5.0(L)  Total Bilirubin 0.3 - 1.2 mg/dL 9.1(Y1.3(H)  Alkaline Phos 38 - 126 U/L 80  AST 15 - 41 U/L 53(H)  ALT 0 - 44 U/L 49(H)    Discharge instruction: per After Visit Summary and "Baby and Me Booklet".  After visit meds:  Allergies as of 10/15/2018      Reactions   Penicillins Hives, Shortness Of Breath, Swelling   Did it involve swelling of the face/tongue/throat, SOB, or low BP? Yes Did it involve sudden or severe rash/hives, skin peeling, or any reaction on the  inside of your mouth or nose? Yes Did you need to seek medical attention at a hospital or doctor's office? Unknown When did it last happen? Childhood reaction If all above answers are "NO", may proceed with cephalosporin use.      Medication List    STOP taking these medications   Accu-Chek Guide test strip Generic drug: glucose blood   glucose blood test strip     TAKE these medications   Concept OB 130-92.4-1 MG Caps Take 1 capsule by mouth daily.   levETIRAcetam 250 MG tablet Commonly known as: KEPPRA One tablet in the morning and two at night for 3 days. One tablet by mouth twice a day for 3 days and then one at bedtime for 3 days   oxyCODONE 5 MG immediate release tablet Commonly known as: Oxy IR/ROXICODONE Take 1-2 tablets (5-10 mg total) by mouth every 4 (four) hours as needed for severe pain.   zolpidem 5 MG tablet Commonly known as: AMBIEN Take 1 tablet (5 mg total) by mouth at bedtime as needed for sleep.       Diet: routine diet  Activity: no heavy lifting, pelvic rest  Outpatient follow up:5 days Follow up Appt: No future appointments. Follow up Visit: Follow-up Information    [facility] on 10/20/2018.   Specialty: Obstetrics and Gynecology Why: staples Contact information: [address], [address] [place] [id number] [phone]       [facility] .   Contact information: [address] [place] [phone]           Please schedule this patient for Postpartum visit in: 1 week with the following provider: MD, possibly--pending For C/S patients schedule nurse incision check in weeks 2 weeks: yes High risk pregnancy complicated by: GDM Delivery mode:  CS Anticipated Birth Control:  other/unsure PP Procedures needed: Incision check 2 hour Schedule Integrated BH visit: yes    Newborn Data: Live born female  Birth Weight:    APGAR: 0, 0  Newborn Delivery   Birth date/time: 10/12/2018 19:55:00 Delivery type:       Disposition:morgue   10/15/2018 Scheryl DarterJames David Towson, MD

## 2018-10-12 NOTE — H&P (Signed)
Late entry note Cheryl Fischer is an 28 y.o. [phone] [id number].   Chief Complaint: abdominal pain HPI: Patient arrived via EMS with abdominal pain, diaphoresis and tachycardia in the 160s. She was awake and alert and talking. Bedside u/s might have shown small flicker, called for formal u/s. Meanwhile trying to start second IV and get labs. Formal u/s showed FHR in the 70s. Code Cesarean called  Past Medical History:  Diagnosis Date  . Anemia   . Blood transfusion without reported diagnosis   . Vaginal Pap smear, abnormal     Past Surgical History:  Procedure Laterality Date  . CESAREAN SECTION      Family History  Problem Relation Age of Onset  . Cancer Paternal Grandmother    Social History:  reports that she has never smoked. She has never used smokeless tobacco. She reports that she does not drink alcohol or use drugs.    Allergies  Allergen Reactions  . Penicillins Hives, Shortness Of Breath and Swelling    Did it involve swelling of the face/tongue/throat, SOB, or low BP? Yes Did it involve sudden or severe rash/hives, skin peeling, or any reaction on the inside of your mouth or nose? Yes Did you need to seek medical attention at a hospital or doctor's office? Unknown When did it last happen? Childhood reaction If all above answers are "NO", may proceed with cephalosporin use.     Medications Prior to Admission  Medication Sig Dispense Refill  . Accu-Chek FastClix Lancets MISC 1 Device by Percutaneous route 4 (four) times daily. (Patient not taking: Reported on 08/31/2018) 100 each 12  . Blood Glucose Monitoring Suppl (ACCU-CHEK GUIDE) w/Device KIT 1 kit by Subdermal route 4 (four) times daily. Check blood sugars for fasting, two hours after breakfast, lunch and dinner (4 checks daily) (Patient not taking: Reported on 08/31/2018) 1 kit 0  . glucose blood (ACCU-CHEK GUIDE) test strip Use as instructed (Patient not taking: Reported on 08/31/2018) 100 each 12  . glucose  blood test strip Use as instructed (Patient not taking: Reported on 08/31/2018) 100 each 12  . metFORMIN (GLUCOPHAGE) 500 MG tablet Take 1 pill orally at bedtime every night (Patient taking differently: Take 500 mg by mouth at bedtime. ) 30 tablet 3  . Prenat w/o A Vit-FeFum-FePo-FA (CONCEPT OB) 130-92.4-1 MG CAPS Take 1 capsule by mouth daily. 30 capsule 11  . terconazole (TERAZOL 7) 0.4 % vaginal cream Place 1 applicator vaginally at bedtime. (Patient not taking: Reported on 06/21/2018) 45 g 0     Review of systems not obtained due to patient factors.  Blood pressure (!) 151/109, last menstrual period 02/10/2018. General appearance: alert, cooperative, moderate distress and moderately obese Head: Normocephalic, without obvious abnormality, atraumatic Neck: supple, symmetrical, trachea midline Lungs: normal effort Heart: tachy Abdomen: gravid, non-tender Extremities: Homans sign is negative, no sign of DVT Skin: Skin color, texture, turgor normal. No rashes or lesions Neurologic: Grossly normal   Lab Results  Component Value Date   WBC 38.3 (H) 10/12/2018   HGB 11.2 (L) 10/12/2018   HCT 33.9 (L) 10/12/2018   MCV 98.5 10/12/2018   PLT 233 10/12/2018         ABO, Rh: --/--/O POS, O POS Performed at [facility] [address] [place] [address] ([phone])  Antibody: NEG (08/27 2030)  Rubella: 1.84 (03/09 1048)  RPR: Non Reactive (07/02 1040)  HBsAg: Negative (03/09 1048)  HIV: Non Reactive (07/02 1040)  GBS:  Assessment/Plan Active Problems:   Hemorrhagic shock (HCC)   Uterine rupture   Neonatal death   Acute blood loss anemia  To Or for emergent delivery  Donnamae Jude 10/12/2018, 9:42 PM

## 2018-10-12 NOTE — Progress Notes (Signed)
This note also relates to the following rows which could not be included: SpO2 - Cannot attach notes to unvalidated device data  Vent changes made per MD

## 2018-10-12 NOTE — Anesthesia Preprocedure Evaluation (Deleted)
Anesthesia Evaluation    Reviewed: Allergy & Precautions, Patient's Chart, lab work & pertinent test results  Airway Mallampati: II  TM Distance: >3 FB Neck ROM: Full    Dental no notable dental hx. (+) Teeth Intact   Pulmonary neg pulmonary ROS,    Pulmonary exam normal breath sounds clear to auscultation       Cardiovascular Exercise Tolerance: Good Normal cardiovascular exam Rhythm:Regular Rate:Normal     Neuro/Psych negative neurological ROS  negative psych ROS   GI/Hepatic negative GI ROS, Neg liver ROS,   Endo/Other  diabetes, Gestational  Renal/GU      Musculoskeletal   Abdominal (+) + obese,   Peds  Hematology   Anesthesia Other Findings   Reproductive/Obstetrics (+) Pregnancy For repeat C/S                             Anesthesia Physical Anesthesia Plan  ASA: IV  Anesthesia Plan: Spinal   Post-op Pain Management:    Induction: Intravenous  PONV Risk Score and Plan: Treatment may vary due to age or medical condition, Ondansetron and Dexamethasone  Airway Management Planned: Natural Airway and Simple Face Mask  Additional Equipment:   Intra-op Plan:   Post-operative Plan:   Informed Consent: I have reviewed the patients History and Physical, chart, labs and discussed the procedure including the risks, benefits and alternatives for the proposed anesthesia with the patient or authorized representative who has indicated his/her understanding and acceptance.     Dental advisory given  Plan Discussed with:   Anesthesia Plan Comments:         Anesthesia Quick Evaluation

## 2018-10-12 NOTE — Consult Note (Signed)
NAME:  Cheryl Fischer, MRN:  [id number], DOB:  1990-05-25, LOS: 0 ADMISSION DATE:  10/12/2018, CONSULTATION DATE:  10/12/18 REFERRING MD:  Kennon Rounds  CHIEF COMPLAINT:  Vent Management   Brief History   Cheryl Fischer is a 28 y.o. female [place] who was admitted 8/27 with acute abdominal pain, found to have FHR in 70's.  Taken for emergent C-section where she was found to have uterine rupture with fetus and placenta floating in the abdomen.  Had 4.8L EBL followed by loss of pulses.  Required roughly 10 minutes of ACLS prior to ROSC.  During resuscitation, she required 7u PRBC, 5u FFP, 1u platelets, 2u albumin. Fetus delivered alive but expired shortly thereafter.  History of present illness   Pt is encephelopathic; therefore, this HPI is obtained from chart review. Cheryl Fischer is a 28 y.o. female [phone] who has a PMH including but not limited to HTN, obesity, gestational diabetes (see "past medical history" for rest).  She presented to [facility]/27 with acute abdominal pain, diaphoresis, tachycardia.  Bedside US was concerning for fetal demise; however, formal US did show FHR in the 70's.  Pt was therefore rushed to OR for cesarean section.    In OR, she was found to have uterine rupture with fetus and placenta floating in the abdomen.  Had 4.8L EBL followed by loss of pulses.  Required roughly 10 minutes of ACLS prior to ROSC.  During resuscitation, she required 7u PRBC, 5u FFP, 1u platelets, 2u albumin. Fetus delivered alive but expired shortly thereafter.  Past Medical History  HTN, obesity, gestational diabetes.  Significant Hospital Events   8/27 > admit.  Consults:  PCCM.  Procedures:  ETT 8/27 >  R IJ CVL 8/27 > R radial art line 8/27 >   Significant Diagnostic Tests:  CXR 8/27 >  EEG 8/27 >  CT head 8/27 >   Micro Data:  SARS CoV2 8/27 >   Antimicrobials:  None.   Interim history/subjective:  Sedated, not responsive. Seizure activity vs myoclonus.  Objective:  Blood pressure (!)  151/109, temperature (!) 92.8 F (33.8 C), temperature source Axillary, weight 113.6 kg, last menstrual period 02/10/2018.       No intake or output data in the 24 hours ending 10/12/18 2203 Filed Weights   10/12/18 2158  Weight: 113.6 kg    Examination: General: Adult female, critically ill. Neuro: Sedated, not responsive.  Questionable seizures vs myoclonus. HEENT: [place]/AT. Sclerae anicteric.  ETT in place. Cardiovascular: Tachy, regular, no M/R/G.  Lungs: Respirations even and unlabored.  CTA bilaterally, No W/R/R. Abdomen: Obese, Midline incision C/D/I. Musculoskeletal: No gross deformities, no edema.  Skin: Intact, warm, no rashes.  Assessment & Plan:   Hemorrhagic shock - due to uterine rupture, s/p repair. S/p 7u PRBC, 5u FFP, 1u platelets, 2u albumin. - STAT CBC, DIC panel. - Transfuse for Hgb < 7. - Levophed if needed (currently not requiring as is hypertensive). - Post op care per OB.  PEA arrest - due to above. - Supportive care. - No TTM due to hemorrhagic shock, but will aim for normothermia and avoid fevers.  Acute hypoxic respiratory failure - 2/2 above.  Also at risk TRALI / TACO. - Full vent support. - Lasix 75m x 1, might need repeat. - Repeat ABG at midnight. - Wean as able. - Daily SBT if mental status allows. - Bronchial hygiene. - Follow CXR.  Concern for seizures vs myoclonus. - 1g Keppra now. - Neuro consulted. - EEG now. - CT  head when stable.  Mild hyperkalemia - presumed 2/2 PRBC transfusions. - 10u insulin, 1amp D50.  Gestational Diabetes. - SSI. - Hold preadmission metformin.   Best Practice:  Diet: NPO. Pain/Anxiety/Delirium protocol (if indicated): Fentanyl gtt / Propofol gtt.  RASS goal -1 to -2. VAP protocol (if indicated): In place. DVT prophylaxis: SCD's only for now. GI prophylaxis: PPI. Glucose control: SSI. Mobility: Bedrest. Code Status: Full. Family Communication: Husband updated by OB. Disposition: ICU.  Labs    CBC: Recent Labs  Lab 10/12/18 1951 10/12/18 1955  WBC 38.3*  --   HGB 11.2*  --   HCT 33.9*  --   MCV 98.5  --   PLT 230 254   Basic Metabolic Panel: Recent Labs  Lab 10/12/18 1951  NA 138  K 4.9  CL 104  CO2 20*  GLUCOSE 284*  BUN 12  CREATININE 1.19*  CALCIUM 9.2   GFR: Estimated Creatinine Clearance: 84.6 mL/min (A) (by C-G formula based on SCr of 1.19 mg/dL (H)). Recent Labs  Lab 10/12/18 1951  WBC 38.3*   Liver Function Tests: Recent Labs  Lab 10/12/18 1951  AST 44*  ALT 32  ALKPHOS 110  BILITOT 0.5  PROT 3.4*  ALBUMIN 1.5*   No results for input(s): LIPASE, AMYLASE in the last 168 hours. No results for input(s): AMMONIA in the last 168 hours. ABG No results found for: PHART, PCO2ART, PO2ART, HCO3, TCO2, ACIDBASEDEF, O2SAT  Coagulation Profile: Recent Labs  Lab 10/12/18 1955  INR 1.3*   Cardiac Enzymes: No results for input(s): CKTOTAL, CKMB, CKMBINDEX, TROPONINI in the last 168 hours. HbA1C: Hgb A1c MFr Bld  Date/Time Value Ref Range Status  09/25/2018 04:11 PM 4.8 4.8 - 5.6 % Final    Comment:             Prediabetes: 5.7 - 6.4          Diabetes: >6.4          Glycemic control for adults with diabetes: <7.0    CBG: No results for input(s): GLUCAP in the last 168 hours.  Review of Systems:   Unable to obtain as pt is encephalopathic.  Past medical history  She,  has a past medical history of Anemia, Blood transfusion without reported diagnosis, and Vaginal Pap smear, abnormal.   Surgical History    Past Surgical History:  Procedure Laterality Date  . CESAREAN SECTION       Social History   reports that she has never smoked. She has never used smokeless tobacco. She reports that she does not drink alcohol or use drugs.   Family history   Her family history includes Cancer in her paternal grandmother.   Allergies Allergies  Allergen Reactions  . Penicillins Hives, Shortness Of Breath and Swelling    Did it involve  swelling of the face/tongue/throat, SOB, or low BP? Yes Did it involve sudden or severe rash/hives, skin peeling, or any reaction on the inside of your mouth or nose? Yes Did you need to seek medical attention at a hospital or doctor's office? Unknown When did it last happen? Childhood reaction If all above answers are "NO", may proceed with cephalosporin use.      Home meds  Prior to Admission medications   Medication Sig Start Date End Date Taking? Authorizing Provider  Accu-Chek FastClix Lancets MISC 1 Device by Percutaneous route 4 (four) times daily. Patient not taking: Reported on 08/31/2018 08/21/18   Lajean Manes, CNM  Blood Glucose Monitoring  Suppl (ACCU-CHEK GUIDE) w/Device KIT 1 kit by Subdermal route 4 (four) times daily. Check blood sugars for fasting, two hours after breakfast, lunch and dinner (4 checks daily) Patient not taking: Reported on 08/31/2018 08/21/18   Lajean Manes, CNM  glucose blood (ACCU-CHEK GUIDE) test strip Use as instructed Patient not taking: Reported on 08/31/2018 08/31/18   Sloan Leiter, MD  glucose blood test strip Use as instructed Patient not taking: Reported on 08/31/2018 08/21/18   Lajean Manes, CNM  metFORMIN (GLUCOPHAGE) 500 MG tablet Take 1 pill orally at bedtime every night Patient taking differently: Take 500 mg by mouth at bedtime.  09/25/18   Emily Filbert, MD  Prenat w/o A Vit-FeFum-FePo-FA (CONCEPT OB) 130-92.4-1 MG CAPS Take 1 capsule by mouth daily. 09/25/18   Emily Filbert, MD  terconazole (TERAZOL 7) 0.4 % vaginal cream Place 1 applicator vaginally at bedtime. Patient not taking: Reported on 06/21/2018 04/25/18   Elvera Maria, CNM    Critical care time: 65 min.    Montey Hora, [facility] Pager: [phone].  If no answer, [phone] 10/12/2018, 10:03 PM

## 2018-10-12 NOTE — Consult Note (Signed)
Neurology Consultation  Reason for Consult: Altered mental status, myoclonic jerking, concern for seizure Referring Physician: Montey Hora, PA-C /Dr. Gilford Raid (Pulmonary Critical Care Medicine)  CC: Altered mental status, myoclonic jerking, concern for seizure  History is obtained from: Chart review  HPI: Kynadie Yaun is a 28 y.o. female past medical history of anemia, who was G5 P3 who is 38 weeks 2-day pregnant came in with severe abdominal pain diaphoresis and tachycardia with heart rate in the 160s, was noted to have uterine rupture, and hemorrhagic shock secondary to acute blood loss, was taken for emergent exploratory laparotomy.  The fetus was delivered but unfortunately did not survive.  She had an estimated nearly 4.8 L of blood loss followed by loss of pulses requiring 10 minutes of ACLS prior to return of spontaneous circulation.  She required 7 units of PRBCs and 5 units of FFP's along with 1 unit of platelets and 2 units of albumin during the resuscitation. Transferred to the ICU for further management.  Noted in the ICU to have frequent jerking movements of all 4 extremities along with nystagmoid eye movements concerning for myoclonus versus seizures for which a neurological consultation was placed. Patient is currently in the medical ICU, under precautions for COVID because the test was not sent because of the emergent nature of presentation and emergent need to go to the OR. No family at bedside at this time.  History gathered from communication with the PCCM team and chart review.  ROS: Unable to obtain due to altered mental status.   Past Medical History:  Diagnosis Date  . Anemia   . Blood transfusion without reported diagnosis   . Vaginal Pap smear, abnormal    Family History  Problem Relation Age of Onset  . Cancer Paternal Grandmother    Social History:   reports that she has never smoked. She has never used smokeless tobacco. She reports that she does not drink  alcohol or use drugs.  Medications  Current Facility-Administered Medications:  .  0.9 %  sodium chloride infusion (Manually program via Guardrails IV Fluids), , Intravenous, Once, Scatliffe, Rise Paganini, MD .  Derrill Memo ON 10/13/2018] Chlorhexidine Gluconate Cloth 2 % PADS 6 each, 6 each, Topical, Daily, Scatliffe, Kristen D, MD .  dextrose 50 % solution 50 mL, 50 mL, Intravenous, Once, Desai, Rahul P, PA-C .  fentaNYL (SUBLIMAZE) bolus via infusion 50 mcg, 50 mcg, Intravenous, Q15 min PRN, Desai, Rahul P, PA-C .  fentaNYL (SUBLIMAZE) injection 50 mcg, 50 mcg, Intravenous, Once, Desai, Rahul P, PA-C .  fentaNYL 25100mcg in NS 270mL (12mcg/ml) infusion-PREMIX, 50-200 mcg/hr, Intravenous, Continuous, Desai, Rahul P, PA-C, Last Rate: 5 mL/hr at 10/12/18 2236, 50 mcg/hr at 10/12/18 2236 .  [START ON 10/13/2018] insulin aspart (novoLOG) injection 0-15 Units, 0-15 Units, Subcutaneous, Q4H, Desai, Rahul P, PA-C .  insulin aspart (novoLOG) injection 10 Units, 10 Units, Intravenous, Once, Desai, Rahul P, PA-C .  lactated ringers bolus 1,000 mL, 1,000 mL, Intravenous, Once, Donnamae Jude, MD .  lactated ringers bolus 1,000 mL, 1,000 mL, Intravenous, Once **FOLLOWED BY** lactated ringers infusion, , Intravenous, Continuous, Donnamae Jude, MD .  levETIRAcetam (KEPPRA) IVPB 1500 mg/ 100 mL premix, 1,500 mg, Intravenous, Once, Desai, Rahul P, PA-C .  oxytocin (PITOCIN) IV infusion 40 units in NS 1000 mL - Premix, 2.5 Units/hr, Intravenous, Continuous, Donnamae Jude, MD, Last Rate: 62.5 mL/hr at 10/12/18 2253, 2.5 Units/hr at 10/12/18 2253 .  propofol (DIPRIVAN) 1000 MG/100ML infusion, 5-80 mcg/kg/min, Intravenous, Titrated,  Scatliffe, Gypsy BalsamKristen D, MD, Last Rate: 13.63 mL/hr at 10/12/18 2310, 20 mcg/kg/min at 10/12/18 2310  Facility-Administered Medications Ordered in Other Encounters:  .  calcium gluconate inj 10% (1 g) URGENT USE ONLY!, , , Anesthesia Intra-op, Sterling, Charlesetta M, CRNA, 1 g at 10/12/18 2008 .   EPINEPHrine (ADRENALIN), , , Anesthesia Intra-op, Sterling, Charlesetta M, CRNA, 1 mg at 10/12/18 2003 .  fentaNYL (SUBLIMAZE) injection, , , Anesthesia Intra-op, Armanda HeritageSterling, Charlesetta M, CRNA, 250 mcg at 10/12/18 2122 .  lactated ringers infusion, , , Continuous PRN, Armanda HeritageSterling, Charlesetta M, CRNA, Stopped at 10/12/18 2102 .  midazolam (VERSED) injection, , , Anesthesia Intra-op, Sterling, Charlesetta M, CRNA, 2 mg at 10/12/18 2124 .  oxytocin (PITOCIN) 40 Units in sodium chloride 0.9 % 1,000 mL infusion, , , Continuous PRN, Armanda HeritageSterling, Charlesetta M, CRNA, 40 Units at 10/12/18 1958 .  phenylephrine (NEO-SYNEPHRINE) injection, , , Anesthesia Intra-op, Armanda HeritageSterling, Charlesetta M, CRNA, 400 mcg at 10/12/18 2306 .  propofol (DIPRIVAN) 10 mg/mL bolus/IV push, , , Anesthesia Intra-op, Sterling, Charlesetta M, CRNA, 200 mg at 10/12/18 1949 .  rocuronium (ZEMURON) injection, , , Anesthesia Intra-op, Sterling, Charlesetta M, CRNA, 30 mg at 10/12/18 2124 .  succinylcholine (ANECTINE) injection, , , Anesthesia Intra-op, Armanda HeritageSterling, Charlesetta M, CRNA, 100 mg at 10/12/18 1949  Exam: Current vital signs: BP (!) 152/125   Pulse (!) 146   Temp (!) 94.1 F (34.5 C)   Resp 18   Wt 113.6 kg   LMP 02/10/2018 (Approximate)   SpO2 100%   BMI 45.81 kg/m  Vital signs in last 24 hours: Temp:  [92.8 F (33.8 C)-95.9 F (35.5 C)] 94.1 F (34.5 C) (08/27 2302) Pulse Rate:  [127-146] 146 (08/27 2302) Resp:  [18-25] 18 (08/27 2302) BP: (151-152)/(109-125) 152/125 (08/27 2300) SpO2:  [100 %] 100 % (08/27 2302) Arterial Line BP: (146-154)/(104-116) 154/116 (08/27 2302) FiO2 (%):  [100 %] 100 % (08/27 2230) Weight:  [113.6 kg] 113.6 kg (08/27 2158) Patient was sedated with propofol drip running at 20 mics per KG per minute, Versed 2 mg an hour and fentanyl 50 mg an hour.  GENERAL: sedated intubated, myoclonic appearing jerks periodically. HEENT: - Normocephalic and atraumatic, ET tube in place LUNGS - vented CV -  RRR ABDOMEN - post op ex lap incision covered by bandage Ext: warm, well perfused NEURO:  Mental Status:sedated and intubated. No meaningful response to stimulation. Has myoclonic jerking in response to tactile stim or nox stim. Language: non verbal Cranial Nerves: PERRL. EOM exam reveals a disorganized pattern of spontaneous nystagmus,  Does not blink to threat from either side Motor: no spontaneous or purposeful movement other than random myoclonic jerks - spontaneous and stimulus induced Tone: is flaccid in all fours Sensation- as above Coordination: unable to ascertain due to mentation Gait- deferred due to mentation  NIHSS - 26 UN   The critical care physician, Dr. Carlota RaspberryScatliffe called me later on to say that patient was nodding yes and no appropriately after sedation was held for a while.  Neurology will continue to reassess at a later point.  Labs I have reviewed labs in epic and the results pertinent to this consultation are:   CBC    Component Value Date/Time   WBC 25.4 (H) 10/12/2018 2225   RBC 4.40 10/12/2018 2225   HGB 13.8 10/12/2018 2225   HGB 12.3 08/17/2018 1040   HCT 40.8 10/12/2018 2225   HCT 36.7 08/17/2018 1040   PLT 168 10/12/2018 2226   PLT 219 08/17/2018  1040   MCV 92.7 10/12/2018 2225   MCV 92 08/17/2018 1040   MCH 31.4 10/12/2018 2225   MCHC 33.8 10/12/2018 2225   RDW 13.9 10/12/2018 2225   RDW 12.7 08/17/2018 1040   LYMPHSABS 1.7 04/24/2018 1048   EOSABS 0.4 04/24/2018 1048   BASOSABS 0.0 04/24/2018 1048    CMP     Component Value Date/Time   NA 136 10/12/2018 2225   K 6.1 (H) 10/12/2018 2225   CL 103 10/12/2018 2225   CO2 18 (L) 10/12/2018 2225   GLUCOSE 269 (H) 10/12/2018 2225   BUN 12 10/12/2018 2225   CREATININE 0.98 10/12/2018 2225   CALCIUM 8.6 (L) 10/12/2018 2225   PROT 5.7 (L) 10/12/2018 2225   ALBUMIN 3.2 (L) 10/12/2018 2225   AST 64 (H) 10/12/2018 2225   ALT 40 10/12/2018 2225   ALKPHOS 107 10/12/2018 2225   BILITOT 2.0 (H)  10/12/2018 2225   GFRNONAA >60 10/12/2018 2225   GFRAA >60 10/12/2018 2225   Imaging I have reviewed the images obtained: No brain imaging at this time.  Imaging could not be obtained due to the labile clinical status of the patient. Chest imaging-chest x-ray showed endotracheal tube tip at the catheter 9 possibly encroaching the right mainstem bronchus.  Right IJ central venous catheter tip over the SVC origin.  No pneumothorax.  Assessment: 28 year old woman, 38 weeks 2 days pregnant who came in with severe abdominal pain diaphoresis and tachycardia and was noted to have a uterine rupture, hemorrhagic shock secondary to acute blood loss, status post exploratory laparotomy and neonatal death, started exhibiting whole-body jerking movements consistent with myoclonic jerking after being transferred to the ICU.  She had a 10-minute episode of loss of pulses requiring ACLS. My examination did not reveal any purposeful responses but later on the ICU physician called me back and said that upon holding sedation, she was nodding purposefully.  We will continue to monitor her exam going forward but it is my suspicion that rapid blood loss due to hemorrhagic shock might have caused some amount of hypoxic/anoxic brain injury.  Other differentials include bilateral watershed ischemic strokes which could have been due to sudden blood loss and hypovolemia/hypotension. It is too early to be able to provide any prognostic information.  Impression: Evaluate for hypoxic anoxic brain injury Evaluate for seizures Status post exploratory laparotomy for ruptured uterus, hemorrhagic shock from acute blood loss. Evaluate for watershed infarcts  Recommendations: Agree with loading with Keppra Continue Keppra 500 twice daily going forward Stat EEG followed by LTM EEG She is too unstable to be taken for CT overnight.  At some point tomorrow when she is more stable, CT head ordered an MRI brain should be  considered. Neurology will continue to follow with you. Plan discussed with the PCCM team on the unit.  Milon Dikes, MD [facility] Pager: [phone] If 7pm to 7am, please call on call as listed on AMION.   CRITICAL CARE ATTESTATION Performed by: Milon Dikes, MD Total critical care time: 40 minutes Critical care time was exclusive of separately billable procedures and treating other patients and/or supervising APPs/Residents/Students Critical care was necessary to treat or prevent imminent or life-threatening deterioration due to possible hypoxic anoxic brain injury, myoclonus This patient is critically ill and at significant risk for neurological worsening and/or death and care requires constant monitoring. Critical care was time spent personally by me on the following activities: development of treatment plan with patient and/or surrogate as well as nursing,  discussions with consultants, evaluation of patient's response to treatment, examination of patient, obtaining history from patient or surrogate, ordering and performing treatments and interventions, ordering and review of laboratory studies, ordering and review of radiographic studies, pulse oximetry, re-evaluation of patient's condition, participation in multidisciplinary rounds and medical decision making of high complexity in the care of this patient.

## 2018-10-12 NOTE — Anesthesia Procedure Notes (Signed)
Arterial Line Insertion Start/End8/27/2020 8:55 PM, 10/12/2018 9:00 PM Performed by: Effie Berkshire, MD, anesthesiologist  Patient location: Pre-op. Preanesthetic checklist: patient identified, IV checked, site marked, risks and benefits discussed, surgical consent, monitors and equipment checked, pre-op evaluation, timeout performed and anesthesia consent Lidocaine 1% used for infiltration Right, radial was placed Catheter size: 20 Fr Hand hygiene performed  and maximum sterile barriers used   Attempts: 1 Procedure performed without using ultrasound guided technique. Following insertion, dressing applied. Post procedure assessment: normal and unchanged  Patient tolerated the procedure well with no immediate complications.

## 2018-10-12 NOTE — Anesthesia Procedure Notes (Signed)
Procedure Name: Intubation Date/Time: 10/12/2018 7:47 PM Performed by: Genevie Ann, CRNA Pre-anesthesia Checklist: Patient identified, Emergency Drugs available, Suction available, Patient being monitored and Timeout performed Patient Re-evaluated:Patient Re-evaluated prior to induction Oxygen Delivery Method: Circle system utilized Preoxygenation: Pre-oxygenation with 100% oxygen Laryngoscope Size: Glidescope, Mac and 3 Grade View: Grade II Tube size: 7.0 mm Number of attempts: 2 Placement Confirmation: ETT inserted through vocal cords under direct vision Secured at: 22 cm Dental Injury: Teeth and Oropharynx as per pre-operative assessment  Difficulty Due To: Difficulty was anticipated

## 2018-10-12 NOTE — Progress Notes (Addendum)
ISTAT ABG ran under pt's child's MRN due to those being the only stickers available on the unit upon pt arrival from [facility]. MD aware of ABG results.  Edit sheet sent to POC at this time to correct error.

## 2018-10-12 NOTE — Anesthesia Procedure Notes (Addendum)
Central Venous Catheter Insertion Performed by: Effie Berkshire, MD, anesthesiologist Start/End8/27/2020 8:19 PM, 10/12/2018 8:36 AM Patient location: Pre-op. Preanesthetic checklist: patient identified, IV checked, site marked, risks and benefits discussed, surgical consent, monitors and equipment checked, pre-op evaluation, timeout performed and anesthesia consent Lidocaine 1% used for infiltration and patient sedated Hand hygiene performed  and maximum sterile barriers used  Catheter size: 8 Fr Total catheter length 16. Central line was placed.Double lumen Procedure performed using ultrasound guided technique. Ultrasound Notes:image(s) printed for medical record Attempts: 1 Following insertion, dressing applied and line sutured. Post procedure assessment: blood return through all ports  Patient tolerated the procedure well with no immediate complications.

## 2018-10-12 NOTE — Op Note (Addendum)
Preoperative Diagnosis:  IUP @ 104w2d, Prior C-section x 4, uterine rupture, hemorrhagic shock, neonatal demise  Postoperative Diagnosis:  Same  Procedure: Exploratory laparotomy, delivery of infant, closure of uterus, evacuation of hemorrhage  Surgeon: Darron Doom, M.D.  Assistant: Lana Fish, MD  Findings: non-Viable female infant, APGAR (1 MIN): 0   APGAR (5 MINS): 0 , floating in abdomen with placenta out of the uterus Uterus with large opening into the fundus  Estimated blood loss: 4800 cc  Fluids: 7 u PRBCs, 5 u FFP, 1 pack plts, 2 Albumin  Complications: None known  Specimens: Placenta to pathology  Reason for procedure: Briefly, the patient is a 28 y.o. [id number] [redacted]w[redacted]d who arrived via EMS with presumed ruptured uterus and taken emergently to the [facility] for FHR in the 70s.  Procedure: Patient is taken to the OR where general analgesia was administered. SCDs were in place. She was prepped with a splash of betadine.  A Foley catheter was placed in the bladder. A knife was then used to make a midline vertical  incision. This incision was carried down to the peritoneal cavity. Upon entry massive blood noted and MTP called immediately.  Adhesions noted and knife used to open further inferiorly. Baby floating in abdomen. Head could not be brought to incision. Feet grabbed and delivered breech. Incision extended superiorly to get the head out. Cord was clamped x 2 and cut immediately. Baby floppy. Infant taken to waiting nurse. Placenta was delivered from the abdomen. Uterus delivered from abdomen and noted to have a long rupture from the LUS up to the fundus. It was firm and was not bleeding a lot. Uterine incision closed with 0 Monocryl suture in a locked running fashion. Halfway through repair, lost pulse and CPR began x 10-15 minutes. Second line, Central line, A-line placed and transfusions began on rapid infuser. A second layer of 0 Monocryl in a baseball stitch was used to  achieve hemostasis. Hemabate injected into uterus due to no IV pitocin at the time. Bulk closure of Fascia, subcutaneous tissue and peritoneum with looped 0 PDS suture in a running fashion from both sides and tied together in the midline. Subcutaneous tissue infused with 30cc 0.25% Marcaine. Subcutaneous closure was performed with 0 plain suture. Skin closed using Staples, followed by pressure dressing. All instrument, needle and lap counts were correct x 2.  Patient remained intubated. Infant expired.   Standley Dakins PrattMD 10/12/2018 9:58 PM

## 2018-10-12 NOTE — OB Triage Provider Note (Signed)
EMS encoded with 38 week pt with abdominal pain, diaphoresis, tachycardia and hx 8 prior C/S.    [id number] @[redacted]w[redacted]d  arrives to MAU.  Hx review reveals 5 previous C/S with 2 sets of twins and 7 living children. Dr Kennon Rounds to MAU and was present when pt arrived. Pt brought immediately by nursing staff to room 120 and EFM applied.  HR 150s audible but MHR 150s-160s.  Bedside US placed by Dr Kennon Rounds.  Cardiac activity not visualized clearly.  Formal US at bedside shows irregular FHR in 70s.  Code cesarean called.  Pt arrived from EMS with 22g IV, 18 g started in MAU and labs drawn.  Pt taken to OR.

## 2018-10-12 NOTE — MAU Note (Signed)
Dr. Kennon Rounds and Fatima Blank, CNM at bedside for arrival of patient and several MAU staff.  Pt arrived EMS approximately 1930 alert and oriented, Pt moved from EMS stretcher to MAU bed by staff. Ultrasound transducer applied and 163bpm was seen on monitor screen, maternal heart rate by pulse oximetry 163bpm.   1935- bedside ultrasound performed by Dr. Kennon Rounds. Also, second intravenous line established by Ileana Ladd, RN 18 gauge in the left hand.   1940-Susan Kennedy at bedside for formal bedside ultrasound. Fetal heart rate detected in the 70's. Code cesarean called, pt transported to OR immediately.

## 2018-10-12 NOTE — Progress Notes (Signed)
Chaplain responded to [facility]. Chaplain arrived to OR where Cheryl Fischer was undergoing an emergent cesarean. Chaplain was notified that heart rates had been lost and regained on both mom and baby. Baby was transported to the NICU.where he died. Chaplain accompanied father to the L&D OR where physicians and nurses informed him of the conditions of his wife and baby. Father was devastated, and asking to see his wife. The nurse asked if the father wanted the chaplain to remain present and it was determined that the chaplain would exit and return at the father's request.   Chaplain Resident, Evelene Croon, [place].  Pager # [phone]

## 2018-10-13 ENCOUNTER — Other Ambulatory Visit (HOSPITAL_COMMUNITY): Payer: Self-pay | Admitting: *Deleted

## 2018-10-13 ENCOUNTER — Inpatient Hospital Stay (HOSPITAL_COMMUNITY): Payer: Medicaid Other

## 2018-10-13 ENCOUNTER — Encounter (HOSPITAL_COMMUNITY): Payer: Self-pay | Admitting: *Deleted

## 2018-10-13 ENCOUNTER — Inpatient Hospital Stay (HOSPITAL_COMMUNITY): Admission: RE | Admit: 2018-10-13 | Payer: Medicaid Other | Source: Home / Self Care | Admitting: Family Medicine

## 2018-10-13 ENCOUNTER — Encounter (HOSPITAL_COMMUNITY)
Admission: AD | Disposition: A | Discharge status: Home / Self Care | Payer: Self-pay | Source: Home / Self Care | Attending: Family Medicine

## 2018-10-13 ENCOUNTER — Ambulatory Visit (HOSPITAL_COMMUNITY)
Admission: RE | Admit: 2018-10-13 | Discharge: 2018-10-13 | Disposition: A | Discharge status: Home / Self Care | Payer: Medicaid Other | Source: Ambulatory Visit | Attending: Obstetrics and Gynecology | Admitting: Obstetrics and Gynecology

## 2018-10-13 ENCOUNTER — Other Ambulatory Visit: Payer: Self-pay

## 2018-10-13 ENCOUNTER — Ambulatory Visit (HOSPITAL_COMMUNITY): Payer: Medicaid Other

## 2018-10-13 DIAGNOSIS — G931 Anoxic brain damage, not elsewhere classified: Secondary | ICD-10-CM

## 2018-10-13 DIAGNOSIS — Z98891 History of uterine scar from previous surgery: Secondary | ICD-10-CM

## 2018-10-13 DIAGNOSIS — G253 Myoclonus: Secondary | ICD-10-CM

## 2018-10-13 DIAGNOSIS — R918 Other nonspecific abnormal finding of lung field: Secondary | ICD-10-CM

## 2018-10-13 LAB — BPAM RBC
Blood Product Expiration Date: 202009022359
Blood Product Expiration Date: 202009022359
Blood Product Expiration Date: 202009052359
Blood Product Expiration Date: 202009122359
Blood Product Expiration Date: 202009122359
Blood Product Expiration Date: 202009122359
Blood Product Expiration Date: 202009122359
Blood Product Expiration Date: 202009122359
Blood Product Expiration Date: 202009132359
Blood Product Expiration Date: 202009142359
Blood Product Expiration Date: 202009222359
Blood Product Expiration Date: 202009222359
Blood Product Expiration Date: 202010032359
Blood Product Expiration Date: 202010032359
Blood Product Expiration Date: 202010032359
Blood Product Expiration Date: 202010032359
ISSUE DATE / TIME: 202008271943
ISSUE DATE / TIME: 202008271943
ISSUE DATE / TIME: 202008272000
ISSUE DATE / TIME: 202008272000
ISSUE DATE / TIME: 202008272021
ISSUE DATE / TIME: 202008272021
ISSUE DATE / TIME: 202008272021
ISSUE DATE / TIME: 202008272021
ISSUE DATE / TIME: 202008272027
ISSUE DATE / TIME: 202008272027
ISSUE DATE / TIME: 202008272027
ISSUE DATE / TIME: 202008272027
ISSUE DATE / TIME: 202008272105
ISSUE DATE / TIME: 202008272105
ISSUE DATE / TIME: 202008272105
ISSUE DATE / TIME: 202008280114
Unit Type and Rh: 5100
Unit Type and Rh: 5100
Unit Type and Rh: 5100
Unit Type and Rh: 5100
Unit Type and Rh: 9500
Unit Type and Rh: 9500
Unit Type and Rh: 9500
Unit Type and Rh: 9500
Unit Type and Rh: 9500
Unit Type and Rh: 9500
Unit Type and Rh: 9500
Unit Type and Rh: 9500
Unit Type and Rh: 9500
Unit Type and Rh: 9500
Unit Type and Rh: 9500
Unit Type and Rh: 9500

## 2018-10-13 LAB — POCT I-STAT 7, (LYTES, BLD GAS, ICA,H+H)
Acid-Base Excess: 3 mmol/L — ABNORMAL HIGH (ref 0.0–2.0)
Acid-base deficit: 10 mmol/L — ABNORMAL HIGH (ref 0.0–2.0)
Bicarbonate: 20.6 mmol/L (ref 20.0–28.0)
Bicarbonate: 29 mmol/L — ABNORMAL HIGH (ref 20.0–28.0)
Calcium, Ion: 1.06 mmol/L — ABNORMAL LOW (ref 1.15–1.40)
Calcium, Ion: 1.14 mmol/L — ABNORMAL LOW (ref 1.15–1.40)
HCT: 40 % (ref 36.0–46.0)
HCT: 34 % — ABNORMAL LOW (ref 36.0–46.0)
Hemoglobin: 13.6 g/dL (ref 12.0–15.0)
Hemoglobin: 11.6 g/dL — ABNORMAL LOW (ref 12.0–15.0)
O2 Saturation: 77 %
O2 Saturation: 100 %
Patient temperature: 92
Patient temperature: 95.9
Potassium: 5.7 mmol/L — ABNORMAL HIGH (ref 3.5–5.1)
Potassium: 3.5 mmol/L (ref 3.5–5.1)
Sodium: 136 mmol/L (ref 135–145)
Sodium: 141 mmol/L (ref 135–145)
TCO2: 23 mmol/L (ref 22–32)
TCO2: 30 mmol/L (ref 22–32)
pCO2 arterial: 57 mmHg — ABNORMAL HIGH (ref 32.0–48.0)
pCO2 arterial: 44.5 mmHg (ref 32.0–48.0)
pH, Arterial: 7.142 — CL (ref 7.350–7.450)
pH, Arterial: 7.416 (ref 7.350–7.450)
pO2, Arterial: 45 mmHg — ABNORMAL LOW (ref 83.0–108.0)
pO2, Arterial: 420 mmHg — ABNORMAL HIGH (ref 83.0–108.0)

## 2018-10-13 LAB — TYPE AND SCREEN
ABO/RH(D): O POS
Antibody Screen: NEGATIVE
Unit division: 0
Unit division: 0
Unit division: 0
Unit division: 0
Unit division: 0
Unit division: 0
Unit division: 0
Unit division: 0
Unit division: 0
Unit division: 0
Unit division: 0
Unit division: 0
Unit division: 0
Unit division: 0
Unit division: 0
Unit division: 0

## 2018-10-13 LAB — PREPARE FRESH FROZEN PLASMA
Unit division: 0
Unit division: 0
Unit division: 0
Unit division: 0
Unit division: 0
Unit division: 0
Unit division: 0
Unit division: 0

## 2018-10-13 LAB — PREPARE PLATELET PHERESIS: Unit division: 0

## 2018-10-13 LAB — BPAM FFP
Blood Product Expiration Date: 202008312359
Blood Product Expiration Date: 202008312359
Blood Product Expiration Date: 202008312359
Blood Product Expiration Date: 202008312359
Blood Product Expiration Date: 202008312359
Blood Product Expiration Date: 202008312359
Blood Product Expiration Date: 202008312359
Blood Product Expiration Date: 202008312359
Blood Product Expiration Date: 202008312359
Blood Product Expiration Date: 202008312359
Blood Product Expiration Date: 202008312359
Blood Product Expiration Date: 202009012359
Blood Product Expiration Date: 202009012359
Blood Product Expiration Date: 202009012359
Blood Product Expiration Date: 202009012359
ISSUE DATE / TIME: 202008272001
ISSUE DATE / TIME: 202008272001
ISSUE DATE / TIME: 202008272001
ISSUE DATE / TIME: 202008272001
ISSUE DATE / TIME: 202008272014
ISSUE DATE / TIME: 202008272014
ISSUE DATE / TIME: 202008272014
ISSUE DATE / TIME: 202008272014
ISSUE DATE / TIME: 202008272022
ISSUE DATE / TIME: 202008272022
ISSUE DATE / TIME: 202008272022
ISSUE DATE / TIME: 202008272033
ISSUE DATE / TIME: 202008272033
ISSUE DATE / TIME: 202008272047
ISSUE DATE / TIME: 202008272053
Unit Type and Rh: 2800
Unit Type and Rh: 2800
Unit Type and Rh: 600
Unit Type and Rh: 6200
Unit Type and Rh: 6200
Unit Type and Rh: 6200
Unit Type and Rh: 6200
Unit Type and Rh: 6200
Unit Type and Rh: 6200
Unit Type and Rh: 6200
Unit Type and Rh: 6200
Unit Type and Rh: 8400
Unit Type and Rh: 8400
Unit Type and Rh: 8400
Unit Type and Rh: 8400

## 2018-10-13 LAB — CBC
HCT: 35.7 % — ABNORMAL LOW (ref 36.0–46.0)
HCT: 34.1 % — ABNORMAL LOW (ref 36.0–46.0)
Hemoglobin: 12.5 g/dL (ref 12.0–15.0)
Hemoglobin: 12.1 g/dL (ref 12.0–15.0)
MCH: 30.8 pg (ref 26.0–34.0)
MCH: 30.7 pg (ref 26.0–34.0)
MCHC: 35 g/dL (ref 30.0–36.0)
MCHC: 35.5 g/dL (ref 30.0–36.0)
MCV: 87.9 fL (ref 80.0–100.0)
MCV: 86.5 fL (ref 80.0–100.0)
Platelets: 122 10*3/uL — ABNORMAL LOW (ref 150–400)
Platelets: 124 10*3/uL — ABNORMAL LOW (ref 150–400)
RBC: 4.06 MIL/uL (ref 3.87–5.11)
RBC: 3.94 MIL/uL (ref 3.87–5.11)
RDW: 14.1 % (ref 11.5–15.5)
RDW: 14.7 % (ref 11.5–15.5)
WBC: 12.9 10*3/uL — ABNORMAL HIGH (ref 4.0–10.5)
WBC: 13.4 10*3/uL — ABNORMAL HIGH (ref 4.0–10.5)
nRBC: 0 % (ref 0.0–0.2)
nRBC: 0 % (ref 0.0–0.2)

## 2018-10-13 LAB — BASIC METABOLIC PANEL
Anion gap: 11 (ref 5–15)
BUN: 13 mg/dL (ref 6–20)
CO2: 25 mmol/L (ref 22–32)
Calcium: 8.3 mg/dL — ABNORMAL LOW (ref 8.9–10.3)
Chloride: 104 mmol/L (ref 98–111)
Creatinine, Ser: 0.72 mg/dL (ref 0.44–1.00)
GFR calc Af Amer: >60 mL/min (ref >60)
GFR calc non Af Amer: >60 mL/min (ref >60)
Glucose, Bld: 114 mg/dL — ABNORMAL HIGH (ref 70–99)
Potassium: 3.4 mmol/L — ABNORMAL LOW (ref 3.5–5.1)
Sodium: 140 mmol/L (ref 135–145)

## 2018-10-13 LAB — SARS CORONAVIRUS 2 (TAT 6-24 HRS): SARS Coronavirus 2: NEGATIVE

## 2018-10-13 LAB — BPAM CRYOPRECIPITATE
Blood Product Expiration Date: 202008280230
ISSUE DATE / TIME: 202008272240
Unit Type and Rh: 5100

## 2018-10-13 LAB — SARS CORONAVIRUS 2 BY RT PCR (HOSPITAL ORDER, PERFORMED IN ~~LOC~~ HOSPITAL LAB): SARS Coronavirus 2: NEGATIVE

## 2018-10-13 LAB — FIBRINOGEN: Fibrinogen: 380 mg/dL (ref 210–475)

## 2018-10-13 LAB — PREPARE CRYOPRECIPITATE: Unit division: 0

## 2018-10-13 LAB — MAGNESIUM
Magnesium: 1.5 mg/dL — ABNORMAL LOW (ref 1.7–2.4)
Magnesium: 1.9 mg/dL (ref 1.7–2.4)

## 2018-10-13 LAB — PROTIME-INR
INR: 1.1 (ref 0.8–1.2)
Prothrombin Time: 14.4 seconds (ref 11.4–15.2)

## 2018-10-13 LAB — BPAM PLATELET PHERESIS
Blood Product Expiration Date: 202008282359
ISSUE DATE / TIME: 202008272017
Unit Type and Rh: 5100

## 2018-10-13 LAB — BLOOD PRODUCT ORDER (VERBAL) VERIFICATION

## 2018-10-13 LAB — GLUCOSE, CAPILLARY
Glucose-Capillary: 117 mg/dL — ABNORMAL HIGH (ref 70–99)
Glucose-Capillary: 265 mg/dL — ABNORMAL HIGH (ref 70–99)
Glucose-Capillary: 89 mg/dL (ref 70–99)
Glucose-Capillary: 78 mg/dL (ref 70–99)

## 2018-10-13 LAB — PHOSPHORUS: Phosphorus: 3.8 mg/dL (ref 2.5–4.6)

## 2018-10-13 SURGERY — Surgical Case
Anesthesia: Regional

## 2018-10-13 MED ORDER — SIMETHICONE 80 MG PO CHEW
80.0000 mg | CHEWABLE_TABLET | ORAL | Status: DC
  Administered 2018-10-13 – 2018-10-15 (×2): 80 mg via ORAL
  Filled 2018-10-13 (×2): qty 1

## 2018-10-13 MED ORDER — SIMETHICONE 80 MG PO CHEW
80.0000 mg | CHEWABLE_TABLET | Freq: Three times a day (TID) | ORAL | Status: DC
  Administered 2018-10-13 – 2018-10-15 (×5): 80 mg via ORAL
  Filled 2018-10-13 (×5): qty 1

## 2018-10-13 MED ORDER — CARBOPROST TROMETHAMINE 250 MCG/ML IM SOLN
INTRAMUSCULAR | Status: AC
  Filled 2018-10-13: qty 2

## 2018-10-13 MED ORDER — ORAL CARE MOUTH RINSE: 15.0000 mL | OROMUCOSAL | Status: DC

## 2018-10-13 MED ORDER — ENOXAPARIN SODIUM 60 MG/0.6ML ~~LOC~~ SOLN
60.0000 mg | SUBCUTANEOUS | Status: DC
  Administered 2018-10-13 – 2018-10-14 (×2): 60 mg via SUBCUTANEOUS
  Filled 2018-10-13 (×2): qty 0.6

## 2018-10-13 MED ORDER — SENNOSIDES-DOCUSATE SODIUM 8.6-50 MG PO TABS
2.0000 | ORAL_TABLET | ORAL | Status: DC
  Administered 2018-10-13 – 2018-10-15 (×2): 2 via ORAL
  Filled 2018-10-13 (×2): qty 2

## 2018-10-13 MED ORDER — POTASSIUM CHLORIDE 10 MEQ/100ML IV SOLN
10.0000 meq | INTRAVENOUS | Status: AC
  Administered 2018-10-13 (×2): 10 meq via INTRAVENOUS
  Filled 2018-10-13 (×2): qty 100

## 2018-10-13 MED ORDER — POTASSIUM CHLORIDE 20 MEQ/15ML (10%) PO SOLN
40.0000 meq | Freq: Once | ORAL | Status: AC
  Administered 2018-10-13: 06:00:00 40 meq
  Filled 2018-10-13: qty 30

## 2018-10-13 MED ORDER — MORPHINE SULFATE (PF) 2 MG/ML IV SOLN
INTRAVENOUS | Status: AC
  Administered 2018-10-13: 10:00:00 2 mg via INTRAVENOUS
  Filled 2018-10-13: qty 1

## 2018-10-13 MED ORDER — CHLORHEXIDINE GLUCONATE 0.12% ORAL RINSE (MEDLINE KIT)
15.0000 mL | Freq: Two times a day (BID) | OROMUCOSAL | Status: DC
  Administered 2018-10-13: 08:00:00 15 mL via OROMUCOSAL

## 2018-10-13 MED ORDER — LACTATED RINGERS IV SOLN
INTRAVENOUS | Status: DC
  Administered 2018-10-13: 18:00:00 via INTRAVENOUS

## 2018-10-13 MED ORDER — SIMETHICONE 80 MG PO CHEW: 80.0000 mg | CHEWABLE_TABLET | ORAL | Status: DC | PRN

## 2018-10-13 MED ORDER — TETANUS-DIPHTH-ACELL PERTUSSIS 5-2.5-18.5 LF-MCG/0.5 IM SUSP: 0.5000 mL | Freq: Once | INTRAMUSCULAR | Status: DC

## 2018-10-13 MED ORDER — SODIUM CHLORIDE 0.9% IV SOLUTION
Freq: Once | INTRAVENOUS | Status: AC
  Administered 2018-10-13: 06:00:00 via INTRAVENOUS

## 2018-10-13 MED ORDER — MORPHINE SULFATE (PF) 2 MG/ML IV SOLN
1.0000 mg | INTRAVENOUS | Status: DC | PRN
  Administered 2018-10-13: 2 mg via INTRAVENOUS
  Filled 2018-10-13: qty 1

## 2018-10-13 MED ORDER — WITCH HAZEL-GLYCERIN EX PADS: 1.0000 "application " | MEDICATED_PAD | CUTANEOUS | Status: DC | PRN

## 2018-10-13 MED ORDER — ZOLPIDEM TARTRATE 5 MG PO TABS
5.0000 mg | ORAL_TABLET | Freq: Every evening | ORAL | Status: DC | PRN
  Administered 2018-10-13: 5 mg via ORAL
  Filled 2018-10-13: qty 1

## 2018-10-13 MED ORDER — COCONUT OIL OIL: 1.0000 "application " | TOPICAL_OIL | Status: DC | PRN

## 2018-10-13 MED ORDER — MAGNESIUM SULFATE 2 GM/50ML IV SOLN
2.0000 g | Freq: Once | INTRAVENOUS | Status: AC
  Administered 2018-10-13: 06:00:00 2 g via INTRAVENOUS
  Filled 2018-10-13: qty 50

## 2018-10-13 MED ORDER — MORPHINE SULFATE (PF) 2 MG/ML IV SOLN
2.0000 mg | INTRAVENOUS | Status: DC | PRN
  Administered 2018-10-13: 10:00:00 2 mg via INTRAVENOUS

## 2018-10-13 MED ORDER — OXYCODONE-ACETAMINOPHEN 5-325 MG PO TABS
1.0000 | ORAL_TABLET | ORAL | Status: DC | PRN
  Administered 2018-10-13 – 2018-10-14 (×4): 2 via ORAL
  Filled 2018-10-13 (×4): qty 2

## 2018-10-13 MED ORDER — DIPHENHYDRAMINE HCL 25 MG PO CAPS
25.0000 mg | ORAL_CAPSULE | Freq: Four times a day (QID) | ORAL | Status: DC | PRN
  Administered 2018-10-14: 09:00:00 25 mg via ORAL
  Filled 2018-10-13: qty 1

## 2018-10-13 MED ORDER — DIBUCAINE (PERIANAL) 1 % EX OINT: 1.0000 "application " | TOPICAL_OINTMENT | CUTANEOUS | Status: DC | PRN

## 2018-10-13 MED ORDER — OXYTOCIN 40 UNITS IN NORMAL SALINE INFUSION - SIMPLE MED: 2.5000 [IU]/h | INTRAVENOUS | Status: AC

## 2018-10-13 MED ORDER — LEVETIRACETAM IN NACL 500 MG/100ML IV SOLN
500.0000 mg | Freq: Two times a day (BID) | INTRAVENOUS | Status: DC
  Administered 2018-10-13: 13:00:00 500 mg via INTRAVENOUS
  Filled 2018-10-13: qty 100

## 2018-10-13 MED ORDER — MORPHINE SULFATE (PF) 2 MG/ML IV SOLN: 1.0000 mg | INTRAVENOUS | Status: DC

## 2018-10-13 MED ORDER — IBUPROFEN 800 MG PO TABS
800.0000 mg | ORAL_TABLET | Freq: Three times a day (TID) | ORAL | Status: DC
  Administered 2018-10-13 – 2018-10-15 (×6): 800 mg via ORAL
  Filled 2018-10-13 (×6): qty 1

## 2018-10-13 MED ORDER — HYDROMORPHONE HCL 1 MG/ML IJ SOLN
INTRAMUSCULAR | Status: AC
  Administered 2018-10-13: 11:00:00 1 mg
  Filled 2018-10-13: qty 1

## 2018-10-13 MED ORDER — PRENATAL MULTIVITAMIN CH
1.0000 | ORAL_TABLET | Freq: Every day | ORAL | Status: DC
  Administered 2018-10-14: 1 via ORAL
  Filled 2018-10-13: qty 1

## 2018-10-13 MED ORDER — MENTHOL 3 MG MT LOZG: 1.0000 | LOZENGE | OROMUCOSAL | Status: DC | PRN

## 2018-10-13 MED ORDER — GABAPENTIN 100 MG PO CAPS
100.0000 mg | ORAL_CAPSULE | Freq: Three times a day (TID) | ORAL | Status: DC
  Administered 2018-10-13 – 2018-10-15 (×6): 100 mg via ORAL
  Filled 2018-10-13 (×6): qty 1

## 2018-10-13 MED ORDER — HYDROMORPHONE HCL 1 MG/ML IJ SOLN: 0.2000 mg | INTRAMUSCULAR | Status: DC | PRN

## 2018-10-13 NOTE — Consult Note (Addendum)
NAME:  Cheryl Fischer, MRN:  [id number], DOB:  03-31-1990, LOS: 1 ADMISSION DATE:  10/12/2018, CONSULTATION DATE:  10/12/18 REFERRING MD:  Kennon Rounds  CHIEF COMPLAINT:  Vent Management   Brief History   Cheryl Fischer is a 28 y.o. female [place] who was admitted 8/27 with acute abdominal pain, found to have FHR in 70's.  Taken for emergent C-section where she was found to have uterine rupture with fetus and placenta floating in the abdomen.  Had 4.8L EBL followed by loss of pulses.  Required roughly 10 minutes of ACLS prior to ROSC.  During resuscitation, she required 7u PRBC, 5u FFP, 1u platelets, 2u albumin. Fetus delivered alive but expired shortly thereafter.  History of present illness   Pt is encephelopathic; therefore, this HPI is obtained from chart review. Cheryl Fischer is a 28 y.o. female [phone] who has a PMH including but not limited to HTN, obesity, gestational diabetes (see "past medical history" for rest).  She presented to [facility] 8/27 with acute abdominal pain, diaphoresis, tachycardia.  Bedside US was concerning for fetal demise; however, formal US did show FHR in the 70's.  Pt was therefore rushed to OR for cesarean section.    In OR, she was found to have uterine rupture with fetus and placenta floating in the abdomen.  Had 4.8L EBL followed by loss of pulses.  Required roughly 10 minutes of ACLS prior to ROSC.  During resuscitation, she required 7u PRBC, 5u FFP, 1u platelets, 2u albumin. Fetus delivered alive but expired shortly thereafter.  Past Medical History  HTN, obesity, gestational diabetes.  Significant Hospital Events   8/27 > admit.  Consults:  PCCM.  Procedures:  ETT 8/27 >  R IJ CVL 8/27 > R radial art line 8/27 >   Significant Diagnostic Tests:  CXR 8/28 > Increased atelectasis versus infiltrate LEFT lower lobe. EEG 8/27 > No seizures CT head 8/27 >   Micro Data:  SARS CoV2 8/27 > Negative  Antimicrobials:  None.   Interim history/subjective:  Sedated, but awake and  alert, following commands No further Seizure activity Belching around ETT  Net negative 9.8 L per I&O  Objective:  Blood pressure 110/81, pulse 87, temperature 98.6 F (37 C), temperature source Oral, resp. rate 16, weight 113.6 kg, last menstrual period 02/10/2018, SpO2 100 %.    Vent Mode: PRVC FiO2 (%):  [40 %-100 %] 60 % Set Rate:  [15 bmp-22 bmp] 18 bmp Vt Set:  [400 mL-500 mL] 400 mL PEEP:  [5 EHU31-49 cmH20] 5 cmH20 Plateau Pressure:  [28 cmH20-31 cmH20] 28 cmH20   Intake/Output Summary (Last 24 hours) at 10/13/2018 0824 Last data filed at 10/13/2018 7026 Gross per 24 hour  Intake 3029.5 ml  Output 13135 ml  Net -10105.5 ml   Filed Weights   10/12/18 2158  Weight: 113.6 kg    Examination: General: Adult female, sedated and intubated Neuro: Sedated, awake and alert, following commands, MAE x 4 , No further seizure activity noted HEENT: [place]/AT. Sclerae anicteric.  ETT to be pulled back 2 cm Cardiovascular: S1, S2, RRR,Tachy, No RMG.  Lungs: Bilateral chest excursion, Respirations even and unlabored.  CTA bilaterally, No W/R/R. Abdomen: Obese, Midline incision C/D/I., Tender, slightly distended Musculoskeletal: No gross deformities, no edema.  Skin: Intact, warm, no rashes, no lesions, multiple tattoos, surgical incisions.  Assessment & Plan:   Hemorrhagic shock - due to uterine rupture, s/p repair. S/p 7u PRBC, 5u FFP, 1u platelets, 2u albumin. No Pressors at present Plan -  INR and Fibrinogen now - Transfuse for Hgb < 7. - platelets for plt count < 50,000 - Cryo for Fibrinogen< 150 - FFP for INR > 2 - Continue Oxytocin through 14:30 8/28 - MAP Goal > 65 mm Hg. - Post op care per OB. - Trend CBC - Monitor for bleeding  PEA arrest - due to above. Currently hemodynamically stable Plan - Supportive care. - No TTM due to hemorrhagic shock, but will aim for normothermia and avoid fevers.  Acute hypoxic respiratory failure - 2/2 above.  Also at risk TRALI /  TACO. ETT 8m from carina Plan - Pull ETT back 2 cm now - Lasix prn - ABG prn - Minimize sedation as able - Wean as able. - Daily SBT if mental status allows. - Bronchial hygiene. - Follow CXR.  Infiltrate vs atelectasis LLL  At risk Trali Plan Trend fever curve and WBC Culture as is clinically indicated  Concern for seizures vs myoclonus. No further seizure activity noted Loaded with 2 g Keppra 8/28 overnight EEG shows no further seizure activity. Plan - Neuro consulted. - Will  add Keppra 500 mg BID for maintenance per neuro note - EEG prn - CT head when stable.  Mild hyperkalemia - presumed 2/2 PRBC transfusions.>> resolved Hypo Mag>> repleted 8/28 Plan Mag now Ionized calcuim now Trend BMET   Gestational Diabetes. - SSI. - Hold preadmission metformin.  Pt. Is awake and alert on versed, fentanyl and propofol. Husband to come this morning to let her know the baby did not survive. Hopeful to wean today with goal of extubation as patient is awake and alert.Will add TF if she does not wean 8/28   Best Practice:  Diet: NPO  Pain/Anxiety/Delirium protocol (if indicated): Fentanyl gtt / Propofol gtt.  RASS goal -1 to -2. VAP protocol (if indicated): In place. DVT prophylaxis: SCD's only for now. GI prophylaxis: PPI. Glucose control: SSI. Mobility: Bedrest. Code Status: Full. Family Communication: Husband updated by OB. Disposition: ICU.  Labs   CBC: Recent Labs  Lab 10/12/18 1951 10/12/18 1955 10/12/18 2225 10/12/18 2226 10/12/18 2333 10/13/18 0342 10/13/18 0344  WBC 38.3*  --  25.4*  --   --   --  12.9*  HGB 11.2*  --  13.8  --  14.3 11.6* 12.5  HCT 33.9*  --  40.8  --  42.0 34.0* 35.7*  MCV 98.5  --  92.7  --   --   --  87.9  PLT 230 233 167 168  --   --  1350   Basic Metabolic Panel: Recent Labs  Lab 10/12/18 1951 10/12/18 2225 10/12/18 2333 10/13/18 0342 10/13/18 0344  NA 138 136 137 141 140  K 4.9 6.1* 3.8 3.5 3.4*  CL 104 103  --    --  104  CO2 20* 18*  --   --  25  GLUCOSE 284* 269*  --   --  114*  BUN 12 12  --   --  13  CREATININE 1.19* 0.98  --   --  0.72  CALCIUM 9.2 8.6*  --   --  8.3*  MG  --   --   --   --  1.5*  PHOS  --   --   --   --  3.8   GFR: Estimated Creatinine Clearance: 125.9 mL/min (by C-G formula based on SCr of 0.72 mg/dL). Recent Labs  Lab 10/12/18 1951 10/12/18 2225 10/13/18 0344  WBC 38.3* 25.4* 12.9*  Liver Function Tests: Recent Labs  Lab 10/12/18 1951 10/12/18 2225  AST 44* 64*  ALT 32 40  ALKPHOS 110 107  BILITOT 0.5 2.0*  PROT 3.4* 5.7*  ALBUMIN 1.5* 3.2*   No results for input(s): LIPASE, AMYLASE in the last 168 hours. No results for input(s): AMMONIA in the last 168 hours. ABG    Component Value Date/Time   PHART 7.416 10/13/2018 0342   PCO2ART 44.5 10/13/2018 0342   PO2ART 420.0 (H) 10/13/2018 0342   HCO3 29.0 (H) 10/13/2018 0342   TCO2 30 10/13/2018 0342   ACIDBASEDEF 3.0 (H) 10/12/2018 2333   O2SAT 100.0 10/13/2018 0342    Coagulation Profile: Recent Labs  Lab 10/12/18 1955 10/12/18 2226  INR 1.3* 1.3*   Cardiac Enzymes: No results for input(s): CKTOTAL, CKMB, CKMBINDEX, TROPONINI in the last 168 hours. HbA1C: Hgb A1c MFr Bld  Date/Time Value Ref Range Status  09/25/2018 04:11 PM 4.8 4.8 - 5.6 % Final    Comment:             Prediabetes: 5.7 - 6.4          Diabetes: >6.4          Glycemic control for adults with diabetes: <7.0    CBG: Recent Labs  Lab 10/12/18 2243 10/12/18 2353 10/13/18 0324 10/13/18 0755  GLUCAP 244* 265* 117* 89    Review of Systems:   Unable to obtain as pt is encephalopathic.  Past medical history  She,  has a past medical history of Anemia, Blood transfusion without reported diagnosis, and Vaginal Pap smear, abnormal.   Surgical History    Past Surgical History:  Procedure Laterality Date  . CESAREAN SECTION       Social History   reports that she has never smoked. She has never used smokeless tobacco.  She reports that she does not drink alcohol or use drugs.   Family history   Her family history includes Cancer in her paternal grandmother.   Allergies Allergies  Allergen Reactions  . Penicillins Hives, Shortness Of Breath and Swelling    Did it involve swelling of the face/tongue/throat, SOB, or low BP? Yes Did it involve sudden or severe rash/hives, skin peeling, or any reaction on the inside of your mouth or nose? Yes Did you need to seek medical attention at a hospital or doctor's office? Unknown When did it last happen? Childhood reaction If all above answers are "NO", may proceed with cephalosporin use.      Home meds  Prior to Admission medications   Medication Sig Start Date End Date Taking? Authorizing Provider  Accu-Chek FastClix Lancets MISC 1 Device by Percutaneous route 4 (four) times daily. Patient not taking: Reported on 08/31/2018 08/21/18   Lajean Manes, CNM  Blood Glucose Monitoring Suppl (ACCU-CHEK GUIDE) w/Device KIT 1 kit by Subdermal route 4 (four) times daily. Check blood sugars for fasting, two hours after breakfast, lunch and dinner (4 checks daily) Patient not taking: Reported on 08/31/2018 08/21/18   Lajean Manes, CNM  glucose blood (ACCU-CHEK GUIDE) test strip Use as instructed Patient not taking: Reported on 08/31/2018 08/31/18   Sloan Leiter, MD  glucose blood test strip Use as instructed Patient not taking: Reported on 08/31/2018 08/21/18   Lajean Manes, CNM  metFORMIN (GLUCOPHAGE) 500 MG tablet Take 1 pill orally at bedtime every night Patient taking differently: Take 500 mg by mouth at bedtime.  09/25/18   Emily Filbert, MD  Prenat w/o A  Vit-FeFum-FePo-FA (CONCEPT OB) 130-92.4-1 MG CAPS Take 1 capsule by mouth daily. 09/25/18   Emily Filbert, MD  terconazole (TERAZOL 7) 0.4 % vaginal cream Place 1 applicator vaginally at bedtime. Patient not taking: Reported on 06/21/2018 04/25/18   Elvera Maria, CNM    Critical care time: [place], AGACNP-BC [facility] Pager: [phone].  If no answer, [phone] 10/13/2018, 8:24 AM  Attending Note:  28 year old female in a horrible situation with uterine rupture and fetal demise s/p cardiac arrest that I feel nothing but sorrow for, can not even imagine what she is going through.  On exam, she is alert and interactive, moving all ext to command with clear lungs.  She is weaning well.  I extubated the patient myself and she is doing very well with no neurologic deficits that I noted.  I reviewed CXR myself, ETT is in a good position with some infiltrate noted but no sign of clinical infection.  Hold off abx.  Extubate.  Control pain.  Chaplain bedside and I greatly appreciate her support.  Patient is very sad and crying which is understandable but clinically is stable.  Spoke with OB, will [facility] to [facility] so patient can be with her family without restriction outside of the COVID unit and OB will monitor patient.  PCCM will sign off, please call back if needed.  The patient is critically ill with multiple organ systems failure and requires high complexity decision making for assessment and support, frequent evaluation and titration of therapies, application of advanced monitoring technologies and extensive interpretation of multiple databases.   Critical Care Time devoted to patient care services described in this note is  39  Minutes. This time reflects time of care of this signee Dr Jennet Maduro. This critical care time does not reflect procedure time, or teaching time or supervisory time of PA/NP/Med student/Med Resident etc but could involve care discussion time.  Rush Farmer, M.D. [facility] Pulmonary/Critical Care Medicine. Pager: [phone]. After hours pager: [phone].

## 2018-10-13 NOTE — Progress Notes (Signed)
Patient ID: Cheryl Fischer, female   DOB: 12-14-1990, 28 y.o.   MRN: [id number]   Subjective: Interval History:POD 1. Awake alert, asking for baby. Still somnolent on Gtts for sedation. High FiO2 needs right now. Abdomen is distended.  Objective: Vital signs in last 24 hours: Temp:  [92.8 F (33.8 C)-98.6 F (37 C)] 98.6 F (37 C) (08/28 0800) Pulse Rate:  [81-148] 91 (08/28 0910) Resp:  [14-30] 14 (08/28 0910) BP: (91-152)/(71-125) 124/75 (08/28 0910) SpO2:  [98 %-100 %] 100 % (08/28 0910) Arterial Line BP: (92-186)/(63-118) 95/76 (08/28 0619) FiO2 (%):  [40 %-100 %] 50 % (08/28 0910) Weight:  [113.6 kg] 113.6 kg (08/27 2158)  Intake/Output from previous day: 08/27 0701 - 08/28 0700 In: 3044.3 [I.V.:2486.2] Out: 00634 [Urine:5475] Intake/Output this shift: Total I/O In: 247.3 [I.V.:148.6; IV Piggyback:98.6] Out: -   General appearance: alert, moderately obese and slowed mentation Head: Normocephalic, without obvious abnormality, atraumatic Lungs: intubated Abdomen: distended, dressing is in place Extremities: Homans sign is negative, no sign of DVT Skin: Skin color, texture, turgor normal. No rashes or lesions    Studies/Results: CBC    Component Value Date/Time   WBC 12.9 (H) 10/13/2018 0344   RBC 4.06 10/13/2018 0344   HGB 12.5 10/13/2018 0344   HGB 12.3 08/17/2018 1040   HCT 35.7 (L) 10/13/2018 0344   HCT 36.7 08/17/2018 1040   PLT 122 (L) 10/13/2018 0344   PLT 219 08/17/2018 1040   MCV 87.9 10/13/2018 0344   MCV 92 08/17/2018 1040   MCH 30.8 10/13/2018 0344   MCHC 35.0 10/13/2018 0344   RDW 14.1 10/13/2018 0344   RDW 12.7 08/17/2018 1040   LYMPHSABS 1.7 04/24/2018 1048   EOSABS 0.4 04/24/2018 1048   BASOSABS 0.0 04/24/2018 1048     Scheduled Meds: . carboprost      . chlorhexidine gluconate (MEDLINE KIT)  15 mL Mouth Rinse BID  . Chlorhexidine Gluconate Cloth  6 each Topical Daily  . fentaNYL (SUBLIMAZE) injection  50 mcg Intravenous Once  . insulin  aspart  0-15 Units Subcutaneous Q4H  . mouth rinse  15 mL Mouth Rinse 10 times per day   Continuous Infusions: . fentaNYL infusion INTRAVENOUS 150 mcg/hr (10/13/18 0900)  . lactated ringers    . lactated ringers     Followed by  . lactated ringers 75 mL/hr at 10/13/18 0900  . levETIRAcetam    . midazolam 1 mg/hr (10/13/18 0900)  . oxytocin 2.5 Units/hr (10/13/18 0335)  . propofol (DIPRIVAN) infusion 10 mcg/kg/min (10/13/18 0900)   PRN Meds:fentaNYL, ondansetron (ZOFRAN) IV  Assessment/Plan: Active Problems:   Hemorrhagic shock (HCC)   Uterine rupture   Neonatal death   Acute blood loss anemia   Hypoxia   Respiratory failure ([place])   Cardiac arrest ([place])  Thanks for CCM for help in managing this acutely ill person.  May need improved pain control for vertical incision.  Staples in place and will need removal at 1 week. Dr. Elly Modena is covering 8-5 on [phone] as needed for OB assistance. Will need Lovenox once bleeding has stabilized.    LOS: 1 day   Donnamae Jude, MD 10/13/2018 9:44 AM

## 2018-10-13 NOTE — Progress Notes (Signed)
Subjective: Awake and alert. Still intubated. Not aware of the loss of her baby.   Objective: Current vital signs: BP 124/75   Pulse 91   Temp 98.6 F (37 C) (Oral)   Resp 14   Wt 113.6 kg   LMP 02/10/2018 (Approximate)   SpO2 100%   BMI 45.81 kg/m  Vital signs in last 24 hours: Temp:  [92.8 F (33.8 C)-98.6 F (37 C)] 98.6 F (37 C) (08/28 0800) Pulse Rate:  [81-148] 91 (08/28 0910) Resp:  [14-30] 14 (08/28 0910) BP: (91-152)/(71-125) 124/75 (08/28 0910) SpO2:  [98 %-100 %] 100 % (08/28 0910) Arterial Line BP: (92-186)/(63-118) 95/76 (08/28 0619) FiO2 (%):  [40 %-100 %] 50 % (08/28 0910) Weight:  [113.6 kg] 113.6 kg (08/27 2158)  Intake/Output from previous day: 08/27 0701 - 08/28 0700 In: 3044.3 [I.V.:2486.2; Blood:196.5; IV Piggyback:361.5] Out: 53646 [Urine:5475; Emesis/NG output:700; Blood:4150] Intake/Output this shift: Total I/O In: 247.3 [I.V.:148.6; IV Piggyback:98.6] Out: -  Nutritional status:  Diet Order            Diet NPO time specified  Diet effective now             Gen: NAD HEENT: Intubated  Neurologic Exam: Ment: Awake and alert. Able to mouth correctly the following: number of fingers held up, year and month. Able to follow motor commands CN: PERRL. EOMI. Face symmetric.  Motor: Moves all 4 extremities with 4/5 strength Reflexes:  1+ bilateral brachioradialis and patellae. Toes upgoing bilaterally.   Lab Results: Results for orders placed or performed during the hospital encounter of 10/12/18 (from the past 48 hour(s))  Prepare platelet pheresis     Status: None   Collection Time: 10/12/18  8:00 AM  Result Value Ref Range   Unit Number [id number]    Blood Component Type PLTP LR2 PAS    Unit division 00    Status of Unit ISSUED,FINAL    Transfusion Status      OK TO TRANSFUSE Performed at [facility] [address] [place], [place] [address]   CBC     Status: Abnormal   Collection Time: 10/12/18  7:51 PM  Result  Value Ref Range   WBC 38.3 (H) 4.0 - 10.5 K/uL   RBC 3.44 (L) 3.87 - 5.11 MIL/uL   Hemoglobin 11.2 (L) 12.0 - 15.0 g/dL   HCT 33.9 (L) 36.0 - 46.0 %   MCV 98.5 80.0 - 100.0 fL   MCH 32.6 26.0 - 34.0 pg   MCHC 33.0 30.0 - 36.0 g/dL   RDW 13.4 11.5 - 15.5 %   Platelets 230 150 - 400 K/uL   nRBC 0.2 0.0 - 0.2 %    Comment: Performed at [facility], [place] [address], [place], [place] [address]  Comprehensive metabolic panel     Status: Abnormal   Collection Time: 10/12/18  7:51 PM  Result Value Ref Range   Sodium 138 135 - 145 mmol/L   Potassium 4.9 3.5 - 5.1 mmol/L   Chloride 104 98 - 111 mmol/L   CO2 20 (L) 22 - 32 mmol/L   Glucose, Bld 284 (H) 70 - 99 mg/dL   BUN 12 6 - 20 mg/dL   Creatinine, Ser 1.19 (H) 0.44 - 1.00 mg/dL   Calcium 9.2 8.9 - 10.3 mg/dL   Total Protein 3.4 (L) 6.5 - 8.1 g/dL   Albumin 1.5 (L) 3.5 - 5.0 g/dL   AST 44 (H) 15 - 41 U/L   ALT  32 0 - 44 U/L   Alkaline Phosphatase 110 38 - 126 U/L   Total Bilirubin 0.5 0.3 - 1.2 mg/dL   GFR calc non Af Amer >60 >60 mL/min   GFR calc Af Amer >60 >60 mL/min   Anion gap 14 5 - 15    Comment: Performed at [facility] [address] [place], [place] [address]  Initiate postpartum hemorrage protocol - BB notification     Status: None   Collection Time: 10/12/18  7:55 PM  Result Value Ref Range   Initiate PPH protocol (BB notified)      RECD 1955 Performed at [facility], [place] [address], [place], [place] [address]   DIC (disseminated intravasc coag) panel     Status: Abnormal   Collection Time: 10/12/18  7:55 PM  Result Value Ref Range   Prothrombin Time 16.3 (H) 11.4 - 15.2 seconds   INR 1.3 (H) 0.8 - 1.2    Comment: (NOTE) INR goal varies based on device and disease states.    aPTT 38 (H) 24 - 36 seconds    Comment:        IF BASELINE aPTT IS ELEVATED, SUGGEST PATIENT RISK ASSESSMENT BE USED TO DETERMINE APPROPRIATE ANTICOAGULANT THERAPY.    Fibrinogen 188 (L) 210 - 475 mg/dL   D-Dimer,  Quant >20.00 (H) 0.00 - 0.50 ug/mL-FEU    Comment: REPEATED TO VERIFY (NOTE) At the manufacturer cut-off of 0.50 ug/mL FEU, this assay has been documented to exclude PE with a sensitivity and negative predictive value of 97 to 99%.  At this time, this assay has not been approved by the FDA to exclude DVT/VTE. Results should be correlated with clinical presentation.    Platelets 233 150 - 400 K/uL   Smear Review NO SCHISTOCYTES SEEN     Comment: Performed at [facility], [place] [address] [place], [place] [address]  Initiate MTP (Blood Bank Notification)     Status: None   Collection Time: 10/12/18  7:55 PM  Result Value Ref Range   Initiate Massive Transfusion Protocol      RECD 1955 Performed at [facility] [address], [place], [place] [address]   Prepare fresh frozen plasma     Status: None   Collection Time: 10/12/18  8:00 PM  Result Value Ref Range   Unit Number [id number]    Blood Component Type THW PLS APHR    Unit division B0    Status of Unit ISSUED,FINAL    Unit tag comment EMERGENCY RELEASE    Transfusion Status OK TO TRANSFUSE    Unit Number [id number]    Blood Component Type THAWED PLASMA    Unit division 00    Status of Unit ISSUED,FINAL    Unit tag comment EMERGENCY RELEASE    Transfusion Status OK TO TRANSFUSE    Unit Number [id number]    Blood Component Type THW PLS APHR    Unit division B0    Status of Unit ISSUED,FINAL    Unit tag comment EMERGENCY RELEASE    Transfusion Status      OK TO TRANSFUSE Performed at [facility], [place] [address] [place], [place] [address]    Unit Number [id number]    Blood Component Type THW PLS APHR    Unit division 00    Status of Unit REL FROM [facility]    Unit tag comment EMERGENCY RELEASE    Transfusion Status OK TO TRANSFUSE  Unit Number [id number]    Blood Component Type THAWED PLASMA    Unit division 00    Status of Unit REL FROM [facility]    Unit tag comment EMERGENCY RELEASE     Transfusion Status OK TO TRANSFUSE    Unit Number [id number]    Blood Component Type THW PLS APHR    Unit division 00    Status of Unit REL FROM [facility]    Unit tag comment EMERGENCY RELEASE    Transfusion Status OK TO TRANSFUSE    Unit Number [id number]    Blood Component Type THAWED PLASMA    Unit division 00    Status of Unit ISSUED,FINAL    Unit tag comment EMERGENCY RELEASE    Transfusion Status OK TO TRANSFUSE    Unit Number [id number]    Blood Component Type THW PLS APHR    Unit division B0    Status of Unit ISSUED,FINAL    Unit tag comment EMERGENCY RELEASE    Transfusion Status OK TO TRANSFUSE    Unit Number [id number]    Blood Component Type THW PLS APHR    Unit division 00    Status of Unit ISSUED,FINAL    Unit tag comment EMERGENCY RELEASE    Transfusion Status OK TO TRANSFUSE    Unit Number [id number]    Blood Component Type THW PLS APHR    Unit division A0    Status of Unit REL FROM [facility]    Unit tag comment EMERGENCY RELEASE    Transfusion Status OK TO TRANSFUSE    Unit Number [id number]    Blood Component Type THW PLS APHR    Unit division 00    Status of Unit REL FROM [facility]    Unit tag comment EMERGENCY RELEASE    Transfusion Status OK TO TRANSFUSE    Unit Number [id number]    Blood Component Type THW PLS APHR    Unit division A0    Status of Unit REL FROM [facility]    Unit tag comment VERBAL ORDERS PER DR PRATT    Transfusion Status OK TO TRANSFUSE    Unit Number [id number]    Blood Component Type THW PLS APHR    Unit division A0    Status of Unit REL FROM [facility]    Unit tag comment VERBAL ORDERS PER DR PRATT    Transfusion Status OK TO TRANSFUSE    Unit Number [id number]    Blood Component Type THAWED PLASMA    Unit division 00    Status of Unit REL FROM [facility]    Unit tag comment VERBAL ORDERS PER DR    Transfusion Status OK TO TRANSFUSE    Unit Number [id number]    Blood Component Type THW PLS APHR    Unit division  B0    Status of Unit REL FROM [facility]    Unit tag comment EMERGENCY RELEASE    Transfusion Status OK TO TRANSFUSE   Type and screen     Status: None   Collection Time: 10/12/18  8:30 PM  Result Value Ref Range   ABO/RH(D) O POS    Antibody Screen NEG    Sample Expiration 10/15/2018,2359    Unit Number [id number]    Blood Component Type RED CELLS,LR    Unit division 00    Status of Unit ISSUED,FINAL    Unit tag comment EMERGENCY RELEASE DR Lavella Lemons PRATT    Transfusion Status OK TO TRANSFUSE    Crossmatch Result COMPATIBLE  Unit Number [id number]    Blood Component Type RED CELLS,LR    Unit division 00    Status of Unit ISSUED,FINAL    Unit tag comment EMERGENCY RELEASE    Transfusion Status OK TO TRANSFUSE    Crossmatch Result COMPATIBLE    Unit Number [id number]    Blood Component Type RED CELLS,LR    Unit division 00    Status of Unit ISSUED,FINAL    Transfusion Status OK TO TRANSFUSE    Crossmatch Result COMPATIBLE    Unit tag comment EMERGENCY RELEASE    Unit Number [id number]    Blood Component Type RBC LR PHER1    Unit division 00    Status of Unit ISSUED,FINAL    Transfusion Status OK TO TRANSFUSE    Crossmatch Result COMPATIBLE    Unit tag comment EMERGENCY RELEASE    Unit Number [id number]    Blood Component Type RBC LR PHER2    Unit division 00    Status of Unit ISSUED,FINAL    Unit tag comment EMERGENCY RELEASE    Transfusion Status OK TO TRANSFUSE    Crossmatch Result COMPATIBLE    Unit Number [id number]    Blood Component Type RED CELLS,LR    Unit division 00    Status of Unit ISSUED,FINAL    Unit tag comment EMERGENCY RELEASE    Transfusion Status OK TO TRANSFUSE    Crossmatch Result COMPATIBLE    Unit Number [id number]    Blood Component Type RED CELLS,LR    Unit division 00    Status of Unit ISSUED,FINAL    Unit tag comment EMERGENCY RELEASE    Transfusion Status OK TO TRANSFUSE    Crossmatch Result COMPATIBLE    Unit Number  [id number]    Blood Component Type RED CELLS,LR    Unit division 00    Status of Unit ISSUED,FINAL    Unit tag comment EMERGENCY RELEASE    Transfusion Status OK TO TRANSFUSE    Crossmatch Result COMPATIBLE    Unit Number [id number]    Blood Component Type RED CELLS,LR    Unit division 00    Status of Unit REL FROM [facility]    Unit tag comment EMERGENCY RELEASE    Transfusion Status OK TO TRANSFUSE    Crossmatch Result NOT NEEDED    Unit Number [id number]    Blood Component Type RED CELLS,LR    Unit division 00    Status of Unit REL FROM [facility]    Unit tag comment EMERGENCY RELEASE    Transfusion Status OK TO TRANSFUSE    Crossmatch Result NOT NEEDED    Unit Number [id number]    Blood Component Type RBC LR PHER2    Unit division 00    Status of Unit REL FROM [facility]    Unit tag comment EMERGENCY RELEASE    Transfusion Status OK TO TRANSFUSE    Crossmatch Result NOT NEEDED    Unit Number [id number]    Blood Component Type RBC, LR IRR    Unit division 00    Status of Unit REL FROM [facility]    Unit tag comment EMERGENCY RELEASE    Transfusion Status OK TO TRANSFUSE    Crossmatch Result NOT NEEDED    Unit Number [id number]    Blood Component Type RED CELLS,LR    Unit division 00    Status of Unit REL FROM [facility]    Unit tag comment EMERGENCY RELEASE    Transfusion Status OK TO TRANSFUSE  Crossmatch Result NOT NEEDED    Unit Number [id number]    Blood Component Type RED CELLS,LR    Unit division 00    Status of Unit REL FROM [facility]    Unit tag comment EMERGENCY RELEASE    Transfusion Status OK TO TRANSFUSE    Crossmatch Result NOT NEEDED    Unit Number [id number]    Blood Component Type RED CELLS,LR    Unit division 00    Status of Unit REL FROM [facility]    Unit tag comment EMERGENCY RELEASE    Transfusion Status OK TO TRANSFUSE    Crossmatch Result NOT NEEDED    Unit Number [id number]    Blood Component Type RED CELLS,LR    Unit division 00     Status of Unit REL FROM [facility]    Unit tag comment EMERGENCY RELEASE    Transfusion Status OK TO TRANSFUSE    Crossmatch Result NOT NEEDED   ABO/Rh     Status: None   Collection Time: 10/12/18  8:30 PM  Result Value Ref Range   ABO/RH(D)      O POS Performed at [facility] [address] [place], [place] [address]   Prepare cryoprecipitate     Status: None   Collection Time: 10/12/18  9:10 PM  Result Value Ref Range   Unit Number [id number]    Blood Component Type CRYPOOL THAW    Unit division 00    Status of Unit ISSUED,FINAL    Transfusion Status      OK TO TRANSFUSE Performed at [facility], [place] [address], [place], [place] [address]   CBC     Status: Abnormal   Collection Time: 10/12/18 10:25 PM  Result Value Ref Range   WBC 25.4 (H) 4.0 - 10.5 K/uL   RBC 4.40 3.87 - 5.11 MIL/uL   Hemoglobin 13.8 12.0 - 15.0 g/dL   HCT 40.8 36.0 - 46.0 %   MCV 92.7 80.0 - 100.0 fL   MCH 31.4 26.0 - 34.0 pg   MCHC 33.8 30.0 - 36.0 g/dL   RDW 13.9 11.5 - 15.5 %   Platelets 167 150 - 400 K/uL   nRBC 0.2 0.0 - 0.2 %    Comment: Performed at [facility], [place] [address], [place], [place] [address]  Comprehensive metabolic panel     Status: Abnormal   Collection Time: 10/12/18 10:25 PM  Result Value Ref Range   Sodium 136 135 - 145 mmol/L   Potassium 6.1 (H) 3.5 - 5.1 mmol/L   Chloride 103 98 - 111 mmol/L   CO2 18 (L) 22 - 32 mmol/L   Glucose, Bld 269 (H) 70 - 99 mg/dL   BUN 12 6 - 20 mg/dL   Creatinine, Ser 0.98 0.44 - 1.00 mg/dL   Calcium 8.6 (L) 8.9 - 10.3 mg/dL   Total Protein 5.7 (L) 6.5 - 8.1 g/dL   Albumin 3.2 (L) 3.5 - 5.0 g/dL   AST 64 (H) 15 - 41 U/L   ALT 40 0 - 44 U/L   Alkaline Phosphatase 107 38 - 126 U/L   Total Bilirubin 2.0 (H) 0.3 - 1.2 mg/dL   GFR calc non Af Amer >60 >60 mL/min   GFR calc Af Amer >60 >60 mL/min   Anion gap 15 5 - 15    Comment: Performed at [facility], [place] [address], [place], [place] [address]  Lactate  dehydrogenase     Status:  Abnormal   Collection Time: 10/12/18 10:25 PM  Result Value Ref Range   LDH 445 (H) 98 - 192 U/L    Comment: Performed at [facility], [place] [address] [place], [place] [address]  DIC (disseminated intravasc coag) panel     Status: Abnormal   Collection Time: 10/12/18 10:26 PM  Result Value Ref Range   Prothrombin Time 15.9 (H) 11.4 - 15.2 seconds   INR 1.3 (H) 0.8 - 1.2    Comment: (NOTE) INR goal varies based on device and disease states.    aPTT 36 24 - 36 seconds   Fibrinogen 238 210 - 475 mg/dL   D-Dimer, Quant >20.00 (H) 0.00 - 0.50 ug/mL-FEU    Comment: (NOTE) At the manufacturer cut-off of 0.50 ug/mL FEU, this assay has been documented to exclude PE with a sensitivity and negative predictive value of 97 to 99%.  At this time, this assay has not been approved by the FDA to exclude DVT/VTE. Results should be correlated with clinical presentation.    Platelets 168 150 - 400 K/uL   Smear Review NO SCHISTOCYTES SEEN     Comment: Performed at [facility], [place] [address] [place], [place] [address]  SARS CORONAVIRUS 2 (TAT 6-12 HRS)     Status: None   Collection Time: 10/12/18 10:32 PM  Result Value Ref Range   SARS Coronavirus 2 NEGATIVE NEGATIVE    Comment: (NOTE) SARS-CoV-2 target nucleic acids are NOT DETECTED. The SARS-CoV-2 RNA is generally detectable in upper and lower respiratory specimens during the acute phase of infection. Negative results do not preclude SARS-CoV-2 infection, do not rule out co-infections with other pathogens, and should not be used as the sole basis for treatment or other patient management decisions. Negative results must be combined with clinical observations, patient history, and epidemiological information. The expected result is Negative. Fact Sheet for Patients: SugarRoll.be Fact Sheet for Healthcare Providers: https://www.woods-mathews.com/ This test is not  yet approved or cleared by the [place] FDA and  has been authorized for detection and/or diagnosis of SARS-CoV-2 by FDA under an Emergency Use Authorization (EUA). This EUA will remain  in effect (meaning this test can be used) for the duration of the COVID-19 declaration under Section 56 4(b)(1) of the Act, 21 U.S.C. section 360bbb-3(b)(1), unless the authorization is terminated or revoked sooner. Performed at [facility], [place] [address] [place],  [address]   Glucose, capillary     Status: Abnormal   Collection Time: 10/12/18 10:43 PM  Result Value Ref Range   Glucose-Capillary 244 (H) 70 - 99 mg/dL  SARS Coronavirus 2 [facility] order, Performed in [facility] hospital lab) Nasopharyngeal Nasopharyngeal Swab     Status: None   Collection Time: 10/12/18 11:24 PM   Specimen: Nasopharyngeal Swab  Result Value Ref Range   SARS Coronavirus 2 NEGATIVE NEGATIVE    Comment: (NOTE) If result is NEGATIVE SARS-CoV-2 target nucleic acids are NOT DETECTED. The SARS-CoV-2 RNA is generally detectable in upper and lower  respiratory specimens during the acute phase of infection. The lowest  concentration of SARS-CoV-2 viral copies this assay can detect is 250  copies / mL. A negative result does not preclude SARS-CoV-2 infection  and should not be used as the sole basis for treatment or other  patient management decisions.  A negative result may occur with  improper specimen collection / handling, submission of specimen other  than nasopharyngeal swab, presence of viral mutation(s) within the  areas targeted by this assay, and inadequate number of viral copies  (<250 copies / mL). A negative result must be combined with clinical  observations, patient history, and epidemiological information. If result is POSITIVE SARS-CoV-2 target nucleic acids are DETECTED. The SARS-CoV-2 RNA is generally detectable in upper and lower  respiratory specimens dur ing the acute phase of  infection.  Positive  results are indicative of active infection with SARS-CoV-2.  Clinical  correlation with patient history and other diagnostic information is  necessary to determine patient infection status.  Positive results do  not rule out bacterial infection or co-infection with other viruses. If result is PRESUMPTIVE POSTIVE SARS-CoV-2 nucleic acids MAY BE PRESENT.   A presumptive positive result was obtained on the submitted specimen  and confirmed on repeat testing.  While 2019 novel coronavirus  (SARS-CoV-2) nucleic acids may be present in the submitted sample  additional confirmatory testing may be necessary for epidemiological  and / or clinical management purposes  to differentiate between  SARS-CoV-2 and other Sarbecovirus currently known to infect humans.  If clinically indicated additional testing with an alternate test  methodology [phone]) is advised. The SARS-CoV-2 RNA is generally  detectable in upper and lower respiratory sp ecimens during the acute  phase of infection. The expected result is Negative. Fact Sheet for Patients:  StrictlyIdeas.no Fact Sheet for Healthcare Providers: BankingDealers.co.za This test is not yet approved or cleared by the [place] FDA and has been authorized for detection and/or diagnosis of SARS-CoV-2 by FDA under an Emergency Use Authorization (EUA).  This EUA will remain in effect (meaning this test can be used) for the duration of the COVID-19 declaration under Section 564(b)(1) of the Act, 21 U.S.C. section 360bbb-3(b)(1), unless the authorization is terminated or revoked sooner. Performed at [facility], [place] [address], [place], [place] [address]   I-STAT 7, (LYTES, BLD GAS, ICA, H+H)     Status: Abnormal   Collection Time: 10/12/18 11:33 PM  Result Value Ref Range   pH, Arterial 7.329 (L) 7.350 - 7.450   pCO2 arterial 43.5 32.0 - 48.0 mmHg   pO2, Arterial 271.0 (H)  83.0 - 108.0 mmHg   Bicarbonate 23.6 20.0 - 28.0 mmol/L   TCO2 25 22 - 32 mmol/L   O2 Saturation 100.0 %   Acid-base deficit 3.0 (H) 0.0 - 2.0 mmol/L   Sodium 137 135 - 145 mmol/L   Potassium 3.8 3.5 - 5.1 mmol/L   Calcium, Ion 1.06 (L) 1.15 - 1.40 mmol/L   HCT 42.0 36.0 - 46.0 %   Hemoglobin 14.3 12.0 - 15.0 g/dL   Patient temperature 94.1 F    Collection site RADIAL, ALLEN'S TEST ACCEPTABLE    Drawn by RT    Sample type ARTERIAL   Glucose, capillary     Status: Abnormal   Collection Time: 10/12/18 11:53 PM  Result Value Ref Range   Glucose-Capillary 265 (H) 70 - 99 mg/dL  Glucose, capillary     Status: Abnormal   Collection Time: 10/13/18  3:24 AM  Result Value Ref Range   Glucose-Capillary 117 (H) 70 - 99 mg/dL  I-STAT 7, (LYTES, BLD GAS, ICA, H+H)     Status: Abnormal   Collection Time: 10/13/18  3:42 AM  Result Value Ref Range   pH, Arterial 7.416 7.350 - 7.450   pCO2 arterial 44.5 32.0 - 48.0 mmHg   pO2, Arterial 420.0 (H) 83.0 - 108.0 mmHg   Bicarbonate 29.0 (H) 20.0 - 28.0 mmol/L  TCO2 30 22 - 32 mmol/L   O2 Saturation 100.0 %   Acid-Base Excess 3.0 (H) 0.0 - 2.0 mmol/L   Sodium 141 135 - 145 mmol/L   Potassium 3.5 3.5 - 5.1 mmol/L   Calcium, Ion 1.14 (L) 1.15 - 1.40 mmol/L   HCT 34.0 (L) 36.0 - 46.0 %   Hemoglobin 11.6 (L) 12.0 - 15.0 g/dL   Patient temperature 95.9 F    Collection site RADIAL, ALLEN'S TEST ACCEPTABLE    Drawn by RT    Sample type ARTERIAL   Basic metabolic panel     Status: Abnormal   Collection Time: 10/13/18  3:44 AM  Result Value Ref Range   Sodium 140 135 - 145 mmol/L   Potassium 3.4 (L) 3.5 - 5.1 mmol/L   Chloride 104 98 - 111 mmol/L   CO2 25 22 - 32 mmol/L   Glucose, Bld 114 (H) 70 - 99 mg/dL   BUN 13 6 - 20 mg/dL   Creatinine, Ser 0.72 0.44 - 1.00 mg/dL   Calcium 8.3 (L) 8.9 - 10.3 mg/dL   GFR calc non Af Amer >60 >60 mL/min   GFR calc Af Amer >60 >60 mL/min   Anion gap 11 5 - 15    Comment: Performed at [facility], [address], [place], [place] [address]  Magnesium     Status: Abnormal   Collection Time: 10/13/18  3:44 AM  Result Value Ref Range   Magnesium 1.5 (L) 1.7 - 2.4 mg/dL    Comment: Performed at [facility] [address], [place], [place] [address]  Phosphorus     Status: None   Collection Time: 10/13/18  3:44 AM  Result Value Ref Range   Phosphorus 3.8 2.5 - 4.6 mg/dL    Comment: Performed at [facility] [address], [place], [place] [address]  CBC     Status: Abnormal   Collection Time: 10/13/18  3:44 AM  Result Value Ref Range   WBC 12.9 (H) 4.0 - 10.5 K/uL   RBC 4.06 3.87 - 5.11 MIL/uL   Hemoglobin 12.5 12.0 - 15.0 g/dL   HCT 35.7 (L) 36.0 - 46.0 %   MCV 87.9 80.0 - 100.0 fL   MCH 30.8 26.0 - 34.0 pg   MCHC 35.0 30.0 - 36.0 g/dL   RDW 14.1 11.5 - 15.5 %   Platelets 122 (L) 150 - 400 K/uL    Comment: REPEATED TO VERIFY Immature Platelet Fraction may be clinically indicated, consider ordering this additional test [id number]    nRBC 0.0 0.0 - 0.2 %    Comment: Performed at [facility], [place] [address], [place], [place] [address]  Prepare Pheresed Platelets     Status: None (Preliminary result)   Collection Time: 10/13/18  5:09 AM  Result Value Ref Range   Unit Number [id number]    Blood Component Type PLTP LR1 PAS    Unit division 00    Status of Unit ISSUED    Transfusion Status OK TO TRANSFUSE   Glucose, capillary     Status: None   Collection Time: 10/13/18  7:55 AM  Result Value Ref Range   Glucose-Capillary 89 70 - 99 mg/dL  Provider-confirm verbal Blood Bank order - Type & Screen, RBC, FFP; 8 Units; Order taken: 10/12/2018; 7:45 PM; MTP 8 RBC ,12FFP ORDERED/ISSUED/8 RBC  AND 6 FFP TRANSFUSED.     Status: None   Collection Time: 10/13/18  8:54  AM  Result Value Ref Range   Blood product order confirm      MD AUTHORIZATION REQUESTED Performed at [facility] [address] [place], [place] [address]     Recent Results (from  the past 240 hour(s))  SARS CORONAVIRUS 2 (TAT 6-12 HRS)     Status: None   Collection Time: 10/12/18 10:32 PM  Result Value Ref Range Status   SARS Coronavirus 2 NEGATIVE NEGATIVE Final    Comment: (NOTE) SARS-CoV-2 target nucleic acids are NOT DETECTED. The SARS-CoV-2 RNA is generally detectable in upper and lower respiratory specimens during the acute phase of infection. Negative results do not preclude SARS-CoV-2 infection, do not rule out co-infections with other pathogens, and should not be used as the sole basis for treatment or other patient management decisions. Negative results must be combined with clinical observations, patient history, and epidemiological information. The expected result is Negative. Fact Sheet for Patients: SugarRoll.be Fact Sheet for Healthcare Providers: https://www.woods-mathews.com/ This test is not yet approved or cleared by the [place] FDA and  has been authorized for detection and/or diagnosis of SARS-CoV-2 by FDA under an Emergency Use Authorization (EUA). This EUA will remain  in effect (meaning this test can be used) for the duration of the COVID-19 declaration under Section 56 4(b)(1) of the Act, 21 U.S.C. section 360bbb-3(b)(1), unless the authorization is terminated or revoked sooner. Performed at [facility], [place] [address], [place], [place] [address]   SARS Coronavirus 2 [facility] order, Performed in [facility] hospital lab) Nasopharyngeal Nasopharyngeal Swab     Status: None   Collection Time: 10/12/18 11:24 PM   Specimen: Nasopharyngeal Swab  Result Value Ref Range Status   SARS Coronavirus 2 NEGATIVE NEGATIVE Final    Comment: (NOTE) If result is NEGATIVE SARS-CoV-2 target nucleic acids are NOT DETECTED. The SARS-CoV-2 RNA is generally detectable in upper and lower  respiratory specimens during the acute phase of infection. The lowest  concentration of SARS-CoV-2 viral copies  this assay can detect is 250  copies / mL. A negative result does not preclude SARS-CoV-2 infection  and should not be used as the sole basis for treatment or other  patient management decisions.  A negative result may occur with  improper specimen collection / handling, submission of specimen other  than nasopharyngeal swab, presence of viral mutation(s) within the  areas targeted by this assay, and inadequate number of viral copies  (<250 copies / mL). A negative result must be combined with clinical  observations, patient history, and epidemiological information. If result is POSITIVE SARS-CoV-2 target nucleic acids are DETECTED. The SARS-CoV-2 RNA is generally detectable in upper and lower  respiratory specimens dur ing the acute phase of infection.  Positive  results are indicative of active infection with SARS-CoV-2.  Clinical  correlation with patient history and other diagnostic information is  necessary to determine patient infection status.  Positive results do  not rule out bacterial infection or co-infection with other viruses. If result is PRESUMPTIVE POSTIVE SARS-CoV-2 nucleic acids MAY BE PRESENT.   A presumptive positive result was obtained on the submitted specimen  and confirmed on repeat testing.  While 2019 novel coronavirus  (SARS-CoV-2) nucleic acids may be present in the submitted sample  additional confirmatory testing may be necessary for epidemiological  and / or clinical management purposes  to differentiate between  SARS-CoV-2 and other Sarbecovirus currently known to infect humans.  If clinically indicated additional testing with an alternate test  methodology [phone]) is advised. The SARS-CoV-2 RNA is generally  detectable in upper and lower respiratory sp ecimens during the acute  phase of infection. The expected result is Negative. Fact Sheet for Patients:  StrictlyIdeas.no Fact Sheet for Healthcare  Providers: BankingDealers.co.za This test is not yet approved or cleared by the [place] FDA and has been authorized for detection and/or diagnosis of SARS-CoV-2 by FDA under an Emergency Use Authorization (EUA).  This EUA will remain in effect (meaning this test can be used) for the duration of the COVID-19 declaration under Section 564(b)(1) of the Act, 21 U.S.C. section 360bbb-3(b)(1), unless the authorization is terminated or revoked sooner. Performed at [facility], [place] [address], [place], [place] [address]     Lipid Panel No results for input(s): CHOL, TRIG, HDL, CHOLHDL, VLDL, LDLCALC in the last 72 hours.  Studies/Results: Dg Chest Port 1 View  Result Date: 10/13/2018 CLINICAL DATA:  Respiratory failure EXAM: PORTABLE CHEST 1 VIEW COMPARISON:  Portable exam 0533 hours compared to 10/12/2018 FINDINGS: Tip of endotracheal tube 6 mm from carina, recommend withdrawal 2 cm. Tip of RIGHT jugular central venous catheter projects over SVC. Nasogastric tube extends into stomach. Borderline enlargement of cardiac silhouette. Increased atelectasis versus infiltrate LEFT lower lobe. Remaining lungs clear. No pleural effusion or pneumothorax. IMPRESSION: Increased atelectasis versus infiltrate LEFT lower lobe. Recommend withdrawal of endotracheal tube 2 cm. Findings called to [place] on [facility] on 10/13/2018 at 0818 hours. Electronically Signed   By: Lavonia Dana M.D.   On: 10/13/2018 08:18   Dg Chest Port 1v Same Day  Result Date: 10/12/2018 CLINICAL DATA:  Line placement EXAM: PORTABLE CHEST 1 VIEW COMPARISON:  None. FINDINGS: Endotracheal tube tip at the carina and may encroach right mainstem bronchus orifice. Esophageal tube tip below the diaphragm but non included. Right IJ central venous catheter tip over the SVC confluence. Low lung volumes. Streaky atelectasis left base. Linear atelectasis right mid lung. Normal heart size. No pneumothorax. IMPRESSION: 1.  Endotracheal tube tip suspected to be at the carina and possibly encroaching the orifice of right mainstem bronchus 2. Right IJ central venous catheter tip over the SVC origin. No pneumothorax. Electronically Signed   By: Donavan Foil M.D.   On: 10/12/2018 22:55    Medications:  Scheduled: . carboprost      . chlorhexidine gluconate (MEDLINE KIT)  15 mL Mouth Rinse BID  . Chlorhexidine Gluconate Cloth  6 each Topical Daily  . fentaNYL (SUBLIMAZE) injection  50 mcg Intravenous Once  . insulin aspart  0-15 Units Subcutaneous Q4H  . mouth rinse  15 mL Mouth Rinse 10 times per day   Continuous: . fentaNYL infusion INTRAVENOUS 150 mcg/hr (10/13/18 0900)  . lactated ringers    . lactated ringers     Followed by  . lactated ringers 75 mL/hr at 10/13/18 0900  . levETIRAcetam    . midazolam 1 mg/hr (10/13/18 0900)  . oxytocin 2.5 Units/hr (10/13/18 0335)  . propofol (DIPRIVAN) infusion 10 mcg/kg/min (10/13/18 0900)    Assessment: 28 year old female with uterine rupture at 38 weeks of pregnancy with loss of fetus, acute blood loss with hemorrhagic shock, s/p ex-lap who had a 10-minute episode of loss of pulses requiring ACLS. She began to exhibit myoclonus in the ICU.   1. Exam improved relative to prior documented exam by Dr. Rory Percy. No seizure activity or myoclonus noted.  2. Rapid blood loss due to hemorrhagic shock might have caused some degree of hypoxic/anoxic brain injury,  which may be reversible given improving exam.    Recommendations: 1. Continue Keppra 500 mg IV BID. This can be tapered off as an outpatient when she is stable.  2. MRI brain when stable.     LOS: 1 day   35 minutes spent in the evaluation and management of this critically ill patient. Time spent included coordination of care.   '@Electronically'$  signed: Dr. Kerney Elbe 10/13/2018  9:20 AM

## 2018-10-13 NOTE — Procedures (Signed)
Extubation Procedure Note  Patient Details:   Name: Cheryl Fischer DOB: 08/22/1990 MRN: [id number]   Airway Documentation:    Vent end date: 10/13/18 Vent end time: 1010   Evaluation  O2 sats: stable throughout Complications: No apparent complications Patient did tolerate procedure well. Bilateral Breath Sounds: Clear   Yes   Patient was extubated to a 2L [facility] with Dr. Nelda Marseille, RN, 2 RT's & chaplin at the bedside. Patient tolerated extubation well, no stridor or distress. Positive cuff leak.   Madisun Hargrove L 10/13/2018, 10:10 AM

## 2018-10-13 NOTE — Progress Notes (Signed)
CSW provided RN with grief and loss resources to provide to the patient whenever appropriate.  Madilyn Fireman, MSW, LCSW-A Clinical Social Worker [facility] [phone]

## 2018-10-13 NOTE — Progress Notes (Signed)
Writer arrived @ bedside for PIV placement per consult. Per care RN additional access not needed @ this time. Instructed to place consult if needs change.

## 2018-10-13 NOTE — Anesthesia Preprocedure Evaluation (Addendum)
Anesthesia Evaluation  Patient identified by MRN, date of birth, ID band  Reviewed: Unable to perform ROS - Chart review onlyPreop documentation limited or incomplete due to emergent nature of procedure.  Airway Mallampati: II  TM Distance: >3 FB Neck ROM: Full    Dental no notable dental hx. (+) Teeth Intact   Pulmonary    Pulmonary exam normal breath sounds clear to auscultation       Cardiovascular Normal cardiovascular exam Rhythm:Regular Rate:Normal     Neuro/Psych    GI/Hepatic   Endo/Other  diabetes  Renal/GU      Musculoskeletal   Abdominal   Peds  Hematology   Anesthesia Other Findings   Reproductive/Obstetrics                             Anesthesia Physical Anesthesia Plan  ASA: IV and emergent  Anesthesia Plan: General   Post-op Pain Management:    Induction: Intravenous, Rapid sequence and Cricoid pressure planned  PONV Risk Score and Plan: 3 and Treatment may vary due to age or medical condition, Ondansetron and Propofol infusion  Airway Management Planned: Oral ETT and Video Laryngoscope Planned  Additional Equipment:   Intra-op Plan:   Post-operative Plan: Possible Post-op intubation/ventilation  Informed Consent: I have reviewed the patients History and Physical, chart, labs and discussed the procedure including the risks, benefits and alternatives for the proposed anesthesia with the patient or authorized representative who has indicated his/her understanding and acceptance.     Dental advisory given and Only emergency history available  Plan Discussed with: CRNA, Anesthesiologist and Surgeon  Anesthesia Plan Comments:        Anesthesia Quick Evaluation

## 2018-10-13 NOTE — Progress Notes (Signed)
PCCM UPDATE  Pt is aroused by verbal stimuli able to understand and follow commas Non verbally communicating When left without stimulation falls to sleep No more myoclonic activity seen on exam Updated by RN  Urine earlier in the evening was dilute and total UOP 5.7L post trx to the ICU In the last 2 hrs patient has only made 75 cc blood tinged Continues on LR   EEG normal no LTM per Neuro Followed up AM Labs: K= 3.4 Mg 1.5 Calcium 8.3  Creatinine improved to 0.72  hgb stable at 1.5  Pt did not receive a UDS/UTox  pt has no h/o drug use when I reviewed the chart and at the time of arrival to ICU she received benzodiazepienes and opiates for sedation and analgesia  Interventions: 1. Hypokalemia: PO 40 meq replacement with 2 runs of IV replacement 2. Hypomagnesemia: Replaced with 2g IVPB 3. ABG: PO2>400 decreased PEEP from 10 to 5>>O2 sats remain 100% started to wean FiO2 down as tolerated. Looks like she may be a candidate for extubation if she meets SBT criteria 4. Pt on wrist restraints to prevent self extubation however she managed to displace her A-line>> RN aware to discontinue it  And will use cuff pressures. Pt never required vasopressors.  5 LR decreased to 75cc/hr  Husband to come in this morning. Wants to be the one to tell her about the baby.   Signed Dr Seward Carol Pul[facility]

## 2018-10-13 NOTE — Procedures (Addendum)
Patient Name: Cheryl Fischer  MRN: [id number]  Epilepsy Attending: Lora Havens  Referring Physician/Provider: Dr Amie Portland Date:  10/13/2018 Duration: 26.66mins  Patient history: 27yo F with uterine rupture and hemorrhagic shock leading to loss of function requiring a minutes of ACLS.  In the ICU patient was noted to have jerking movements in all 4 extremities concerning for seizure.  EEG to evaluate for seizure.  Level of alertness: Sedated  AEDs during EEG study: Versed  Technical aspects: This EEG study was done with scalp electrodes positioned according to the 10-20 International system of electrode placement. Electrical activity was acquired at a sampling rate of 500Hz  and reviewed with a high frequency filter of 70Hz  and a low frequency filter of 1Hz . EEG data were recorded continuously and digitally stored.   Description: There was continuous generalized 3 to 6 Hz theta-delta slowing. There was also 15 to 18 Hz, 2-3 uV excessive beta activity with irregular morphology distributed symmetrically and diffusely.  EEG was reactive to stimuli. Hyperventilation and photic stimulation were not performed.  IMPRESSION: This study is suggestive moderate diffuse encephalopathy. No seizures or epileptiform discharges were seen throughout the recording.  The excessive beta activity seen in the background is most likely due to the effect of benzodiazepine and is a benign EEG pattern.  [name] [name]

## 2018-10-13 NOTE — Progress Notes (Signed)
I offered grief support throughout the day both when patient was in the ICU and when she transferred back to [facility].   The baby, Darrelle, was named after a family member on his dad's side.  Dominica Severin (FOB) also had lost his mother.  He is taking comfort in imagining his mother caring for his son in heaven.  They have 7 other children (2 sets of twins ages 71 and 102 with the oldest of the children being 41).  The children are now staying with Gary's aunt so that he can be here with Otila Kluver.  I offered ministry of presence and provided space for Sariya to begin to process all that happened with her delivery. n  [place], [place] Pager, [phone] 4:48 PM

## 2018-10-13 NOTE — Progress Notes (Signed)
Preliminary EEG reviewed with Dr. Hortense Ramal.  No seizures. No need for LTM EEG.  Exam also reportedly improving. Neurology will continue to follow.  -- Amie Portland, MD [facility] Pager: [phone] If 7pm to 7am, please call on call as listed on AMION.

## 2018-10-13 NOTE — Progress Notes (Signed)
All morning pt has been pointing to her stomach & trying to find out what happened to her baby. Staff has been trying to redirect her as her husband has asked to be the one who informs her of the baby's passing. While neurology was in pt's room this morning, he informed me the pt was again pointing to her stomach, thinking she must have to use the restroom. This RN tried to indicate that the pt was unaware of the baby passing by bulging my eyes and shaking my head. Doctor responded by saying, "Oh she doesn't know?" while we were still in the pt's room. Pt clearly heard that and began to sob. Her RR increased to the 50s. Eric Form, NP, notified. Verbal orders to call RT and ask that pt be put back on full support. Will continue to closely monitor pt.

## 2018-10-13 NOTE — Progress Notes (Signed)
Stat EEG completed, results pending  

## 2018-10-14 ENCOUNTER — Inpatient Hospital Stay (HOSPITAL_COMMUNITY): Payer: Medicaid Other

## 2018-10-14 DIAGNOSIS — I82409 Acute embolism and thrombosis of unspecified deep veins of unspecified lower extremity: Secondary | ICD-10-CM

## 2018-10-14 DIAGNOSIS — R2232 Localized swelling, mass and lump, left upper limb: Secondary | ICD-10-CM

## 2018-10-14 LAB — BPAM PLATELET PHERESIS
Blood Product Expiration Date: 202008282359
ISSUE DATE / TIME: 202008280528
Unit Type and Rh: 5100

## 2018-10-14 LAB — CBC
HCT: 31.2 % — ABNORMAL LOW (ref 36.0–46.0)
Hemoglobin: 10.7 g/dL — ABNORMAL LOW (ref 12.0–15.0)
MCH: 30.4 pg (ref 26.0–34.0)
MCHC: 34.3 g/dL (ref 30.0–36.0)
MCV: 88.6 fL (ref 80.0–100.0)
Platelets: 119 10*3/uL — ABNORMAL LOW (ref 150–400)
RBC: 3.52 MIL/uL — ABNORMAL LOW (ref 3.87–5.11)
RDW: 15.5 % (ref 11.5–15.5)
WBC: 10.2 10*3/uL (ref 4.0–10.5)
nRBC: 0 % (ref 0.0–0.2)

## 2018-10-14 LAB — PREPARE PLATELET PHERESIS: Unit division: 0

## 2018-10-14 LAB — CALCIUM, IONIZED: Calcium, Ionized, Serum: 4.5 mg/dL (ref 4.5–5.6)

## 2018-10-14 LAB — COMPREHENSIVE METABOLIC PANEL
ALT: 49 U/L — ABNORMAL HIGH (ref 0–44)
AST: 53 U/L — ABNORMAL HIGH (ref 15–41)
Albumin: 2.4 g/dL — ABNORMAL LOW (ref 3.5–5.0)
Alkaline Phosphatase: 80 U/L (ref 38–126)
Anion gap: 8 (ref 5–15)
BUN: 7 mg/dL (ref 6–20)
CO2: 25 mmol/L (ref 22–32)
Calcium: 8.3 mg/dL — ABNORMAL LOW (ref 8.9–10.3)
Chloride: 104 mmol/L (ref 98–111)
Creatinine, Ser: 0.78 mg/dL (ref 0.44–1.00)
GFR calc Af Amer: >60 mL/min (ref >60)
GFR calc non Af Amer: >60 mL/min (ref >60)
Glucose, Bld: 93 mg/dL (ref 70–99)
Potassium: 3.5 mmol/L (ref 3.5–5.1)
Sodium: 137 mmol/L (ref 135–145)
Total Bilirubin: 1.3 mg/dL — ABNORMAL HIGH (ref 0.3–1.2)
Total Protein: 5 g/dL — ABNORMAL LOW (ref 6.5–8.1)

## 2018-10-14 LAB — FIBRINOGEN: Fibrinogen: 570 mg/dL — ABNORMAL HIGH (ref 210–475)

## 2018-10-14 LAB — MAGNESIUM: Magnesium: 1.7 mg/dL (ref 1.7–2.4)

## 2018-10-14 LAB — RPR
RPR Ser Ql: NONREACTIVE
RPR Ser Ql: NONREACTIVE

## 2018-10-14 LAB — HAPTOGLOBIN: Haptoglobin: 63 mg/dL (ref 33–278)

## 2018-10-14 MED ORDER — ACETAMINOPHEN 500 MG PO TABS
1000.0000 mg | ORAL_TABLET | Freq: Four times a day (QID) | ORAL | Status: DC | PRN
  Administered 2018-10-14 – 2018-10-15 (×2): 1000 mg via ORAL
  Filled 2018-10-14 (×2): qty 2

## 2018-10-14 MED ORDER — OXYCODONE HCL 5 MG PO TABS
5.0000 mg | ORAL_TABLET | ORAL | Status: DC | PRN
  Administered 2018-10-14 – 2018-10-15 (×5): 10 mg via ORAL
  Filled 2018-10-14 (×5): qty 2

## 2018-10-14 MED ORDER — LEVETIRACETAM 500 MG PO TABS
500.0000 mg | ORAL_TABLET | Freq: Two times a day (BID) | ORAL | Status: DC
  Administered 2018-10-14 – 2018-10-15 (×3): 500 mg via ORAL
  Filled 2018-10-14 (×3): qty 1

## 2018-10-14 MED FILL — Medication: Qty: 1 | Status: AC

## 2018-10-14 NOTE — Progress Notes (Signed)
[facility] CSW consulted for housing resources. CSW spoke with pt and significant other at bedside. CSW offered support to pt and significant other at this time. CSW advised both as to why CSW had come to visit with them. CSW explained resources giving to pt pt and significant other. CSW advised them that if they needed assistance with calling to please inform CSW and CSW would assist at best. CSW encouraged both to take the time needed feel emotions and let CSW know if there is anything that CSW can to to assist. Both understanding with no further questions at this time.       Cheryl Fischer, MSW, LCSW [facility] [place] [facility] [place] [phone]

## 2018-10-14 NOTE — Progress Notes (Signed)
VASCULAR LAB PRELIMINARY  PRELIMINARY  PRELIMINARY  PRELIMINARY  Left upper extremity venous duplex completed.    Preliminary report:  See CV proc for preliminary results.  Called Dr. Scot Dock with results.   Avaline Stillson, RVT 10/14/2018, 5:48 PM

## 2018-10-14 NOTE — Consult Note (Signed)
REASON FOR CONSULT:    Left upper extremity swelling.  The consult is requested by the OB/GYN service  ASSESSMENT & PLAN:   LEFT UPPER EXTREMITY SWELLING: This patient has moderate left upper extremity swelling possibly related to an IV infiltrate.  Venous duplex scan shows no evidence of DVT.  She is on DVT prophylaxis.  On exam she has a palpable pulse in the left wrist and normal signals in the radial, ulnar, and palmar arch signal.  The hand is warm and well perfused with no neurologic deficit.  I have simply encouraged her to elevate her left arm above her heart by positioning her bed flat and elevating her left arm on 3 pillows.  Vascular surgery will be available as needed.  Waverly Ferrarihristopher Veretta Sabourin, MD, FACS [facility] [phone] Office: [phone]   HPI:   Cheryl Fischer is a pleasant 28 y.o. female, who underwent emergency C-section was found to have a ruptured uterus.  She had significant blood loss requiring transfusion.  She has recovered from this.  She was noted to potentially have had an infiltrate from her IV in her left hand and we were consulted because of the concern for the degree of swelling.  She denies any previous history of DVT.  She denies any chest pain or shortness of breath.  She has never had a pacemaker or any subclavian lines that she is aware of.  Past Medical History:  Diagnosis Date  . Anemia   . Blood transfusion without reported diagnosis   . Vaginal Pap smear, abnormal     Family History  Problem Relation Age of Onset  . Cancer Paternal Grandmother     SOCIAL HISTORY: Social History   Socioeconomic History  . Marital status: Single    Spouse name: Not on file  . Number of children: 7  . Years of education: Not on file  . Highest education level: Not on file  Occupational History  . Not on file  Social Needs  . Financial resource strain: Not on file  . Food insecurity    Worry: Not on file    Inability: Not on file  . Transportation needs   Medical: Not on file    Non-medical: Not on file  Tobacco Use  . Smoking status: Never Smoker  . Smokeless tobacco: Never Used  Substance and Sexual Activity  . Alcohol use: Never    Frequency: Never  . Drug use: Never  . Sexual activity: Yes    Birth control/protection: None    Comment: last sex 17 Aug 20  Lifestyle  . Physical activity    Days per week: Not on file    Minutes per session: Not on file  . Stress: Not on file  Relationships  . Social Musicianconnections    Talks on phone: Not on file    Gets together: Not on file    Attends religious service: Not on file    Active member of club or organization: Not on file    Attends meetings of clubs or organizations: Not on file    Relationship status: Not on file  . Intimate partner violence    Fear of current or ex partner: Not on file    Emotionally abused: Not on file    Physically abused: Not on file    Forced sexual activity: Not on file  Other Topics Concern  . Not on file  Social History Narrative  . Not on file    Allergies  Allergen Reactions  .  Penicillins Hives, Shortness Of Breath and Swelling    Did it involve swelling of the face/tongue/throat, SOB, or low BP? Yes Did it involve sudden or severe rash/hives, skin peeling, or any reaction on the inside of your mouth or nose? Yes Did you need to seek medical attention at a hospital or doctor's office? Unknown When did it last happen? Childhood reaction If all above answers are "NO", may proceed with cephalosporin use.     Current Facility-Administered Medications  Medication Dose Route Frequency Provider Last Rate Last Dose  . acetaminophen (TYLENOL) tablet 1,000 mg  1,000 mg Oral Q6H PRN Constant, Peggy, MD   1,000 mg at 10/14/18 0859  . coconut oil  1 application Topical PRN Constant, Peggy, MD      . witch hazel-glycerin (TUCKS) pad 1 application  1 application Topical PRN Constant, Peggy, MD       And  . dibucaine (NUPERCAINAL) 1 % rectal ointment 1  application  1 application Rectal PRN Constant, Peggy, MD      . diphenhydrAMINE (BENADRYL) capsule 25 mg  25 mg Oral Q6H PRN Constant, Peggy, MD   25 mg at 10/14/18 0859  . enoxaparin (LOVENOX) injection 60 mg  60 mg Subcutaneous Q24H Constant, Peggy, MD   60 mg at 10/13/18 1948  . fentaNYL (SUBLIMAZE) injection 50 mcg  50 mcg Intravenous Once Desai, Rahul P, PA-C      . gabapentin (NEURONTIN) capsule 100 mg  100 mg Oral TID Constant, Peggy, MD   100 mg at 10/14/18 1602  . ibuprofen (ADVIL) tablet 800 mg  800 mg Oral Q8H Constant, Peggy, MD   800 mg at 10/14/18 1346  . lactated ringers bolus 1,000 mL  1,000 mL Intravenous Once Reva Bores, MD      . lactated ringers infusion   Intravenous Continuous Constant, Peggy, MD   Stopped at 10/14/18 5374  . levETIRAcetam (KEPPRA) tablet 500 mg  500 mg Oral BID Constant, Peggy, MD   500 mg at 10/14/18 1032  . menthol-cetylpyridinium (CEPACOL) lozenge 3 mg  1 lozenge Oral Q2H PRN Constant, Peggy, MD      . oxyCODONE (Oxy IR/ROXICODONE) immediate release tablet 5-10 mg  5-10 mg Oral Q4H PRN Constant, Peggy, MD   10 mg at 10/14/18 1653  . prenatal multivitamin tablet 1 tablet  1 tablet Oral Q1200 Constant, Peggy, MD   1 tablet at 10/14/18 1208  . senna-docusate (Senokot-S) tablet 2 tablet  2 tablet Oral Q24H Constant, Peggy, MD   2 tablet at 10/13/18 2351  . simethicone (MYLICON) chewable tablet 80 mg  80 mg Oral TID PC Constant, Peggy, MD   80 mg at 10/14/18 1208  . simethicone (MYLICON) chewable tablet 80 mg  80 mg Oral Q24H Constant, Peggy, MD   80 mg at 10/13/18 2351  . simethicone (MYLICON) chewable tablet 80 mg  80 mg Oral PRN Constant, Peggy, MD      . Tdap (BOOSTRIX) injection 0.5 mL  0.5 mL Intramuscular Once Constant, Peggy, MD      . zolpidem (AMBIEN) tablet 5 mg  5 mg Oral QHS PRN Constant, Peggy, MD   5 mg at 10/13/18 2351   Facility-Administered Medications Ordered in Other Encounters  Medication Dose Route Frequency Provider Last Rate Last  Dose  . 0.9 %  sodium chloride infusion    Continuous PRN Armanda Heritage, CRNA   250 mL at 10/13/18 1238  . calcium gluconate inj 10% (1 g) URGENT USE ONLY!  Anesthesia Intra-op Armanda HeritageSterling, Charlesetta M, CRNA   1 g at 10/12/18 2008  . EPINEPHrine (ADRENALIN)    Anesthesia Intra-op Armanda HeritageSterling, Charlesetta M, CRNA   1 mg at 10/12/18 2003  . fentaNYL (SUBLIMAZE) injection    Anesthesia Intra-op Armanda HeritageSterling, Charlesetta M, CRNA   250 mcg at 10/12/18 2122  . lactated ringers infusion    Continuous PRN Armanda HeritageSterling, Charlesetta M, CRNA   Stopped at 10/12/18 2102  . midazolam (VERSED) injection    Anesthesia Intra-op Armanda HeritageSterling, Charlesetta M, CRNA   2 mg at 10/12/18 2124  . oxytocin (PITOCIN) 40 Units in sodium chloride 0.9 % 1,000 mL infusion    Continuous PRN Armanda HeritageSterling, Charlesetta M, CRNA   40 Units at 10/12/18 1958  . phenylephrine (NEO-SYNEPHRINE) injection    Anesthesia Intra-op Armanda HeritageSterling, Charlesetta M, CRNA   400 mcg at 10/12/18 2306  . propofol (DIPRIVAN) 10 mg/mL bolus/IV push    Anesthesia Intra-op Armanda HeritageSterling, Charlesetta M, CRNA   200 mg at 10/12/18 1949  . rocuronium [facility](ZEMURON) injection    Anesthesia Intra-op Armanda HeritageSterling, Charlesetta M, CRNA   30 mg at 10/12/18 2124  . succinylcholine (ANECTINE) injection    Anesthesia Intra-op Armanda HeritageSterling, Charlesetta M, CRNA   100 mg at 10/12/18 1949    REVIEW OF SYSTEMS:  [X]  denotes positive finding, [ ]  denotes negative finding Cardiac  Comments:  Chest pain or chest pressure:    Shortness of breath upon exertion:    Short of breath when lying flat:    Irregular heart rhythm:        Vascular    Pain in calf, thigh, or hip brought on by ambulation:    Pain in feet at night that wakes you up from your sleep:     Blood clot in your veins:    Leg swelling:         Pulmonary    Oxygen at home:    Productive cough:     Wheezing:         Neurologic    Sudden weakness in arms or legs:     Sudden numbness in arms or legs:     Sudden onset of difficulty  speaking or slurred speech:    Temporary loss of vision in one eye:     Problems with dizziness:         Gastrointestinal    Blood in stool:     Vomited blood:         Genitourinary    Burning when urinating:     Blood in urine:        Psychiatric    Major depression:         Hematologic    Bleeding problems:    Problems with blood clotting too easily:        Skin    Rashes or ulcers:        Constitutional    Fever or chills:     PHYSICAL EXAM:   Vitals:   10/14/18 1100 10/14/18 1205 10/14/18 1500 10/14/18 1610  BP:  104/61  110/83  Pulse:  (!) 58  76  Resp:  17  18  Temp:  97.9 F (36.6 C)  98 F (36.7 C)  TempSrc:  Oral  Oral  SpO2: 100% 99% 99% 100%  Weight:        GENERAL: The patient is a well-nourished female, in no acute distress. The vital signs are documented above. CARDIAC: There is a regular rate and rhythm.  VASCULAR: I do  not detect carotid bruits. She has palpable radial pulses bilaterally. She has moderate swelling in the left upper extremity especially in the hand. PULMONARY: There is good air exchange bilaterally without wheezing or rales. ABDOMEN: Soft and non-tender with normal pitched bowel sounds.  MUSCULOSKELETAL: There are no major deformities or cyanosis. NEUROLOGIC: No focal weakness or paresthesias are detected. SKIN: There are no ulcers or rashes noted. PSYCHIATRIC: The patient has a normal affect.  DATA:    VENOUS DUPLEX: I have independently interpreted her venous duplex scan today.  There is no evidence of left upper extremity DVT or superficial venous thrombosis.

## 2018-10-14 NOTE — Progress Notes (Signed)
Faculty Attending Note  Post Op Day 2  Subjective: Patient is feeling okay, woke her from sleep. She reports well controlled pain on PO pain meds. She is ambulating minimally, denies light-headedness or dizziness. Foley catheter out this am, due to void. She is not passing flatus, has not had a BM yet. States she feels gas moving around in her abdomen. She is tolerating a regular diet without nausea/vomiting. Bleeding is moderate. Baby passed yesterday am.  Objective: Blood pressure 106/66, pulse 68, temperature 97.8 F (36.6 C), temperature source Oral, resp. rate 18, weight 113.6 kg, last menstrual period 02/10/2018, SpO2 99 %. Temp:  [97.8 F (36.6 C)-98.6 F (37 C)] 97.8 F (36.6 C) (08/29 0815) Pulse Rate:  [64-122] 68 (08/29 0815) Resp:  [14-22] 18 (08/29 0815) BP: (105-130)/(58-97) 106/66 (08/29 0815) SpO2:  [92 %-100 %] 99 % (08/29 0815) FiO2 (%):  [50 %] 50 % (08/28 0910)  Physical Exam:  General: alert, oriented, cooperative, tearful when baby was mentioned Chest: normal respiratory effort Heart: RRR  Abdomen: softly distended, appropriately tender to palpation, incision covered by dressing with no evidence of active bleeding  Uterine Fundus: difficult to palpate secondary to distension Lochia: moderate, rubra DVT Evaluation: no evidence of DVT Extremities: no edema, no calf tenderness  UOP: foley recently out, has not voided yet  CBC Latest Ref Rng & Units 10/13/2018 10/13/2018 10/13/2018  WBC 4.0 - 10.5 K/uL 13.4(H) 12.9(H) -  Hemoglobin 12.0 - 15.0 g/dL 12.1 12.5 11.6(L)  Hematocrit 36.0 - 46.0 % 34.1(L) 35.7(L) 34.0(L)  Platelets 150 - 400 K/uL 124(L) 122(L) -   CMP Latest Ref Rng & Units 10/13/2018 10/13/2018 11/04/18  Glucose 70 - 99 mg/dL 114(H) - -  BUN 6 - 20 mg/dL 13 - -  Creatinine 0.44 - 1.00 mg/dL 0.72 - -  Sodium 135 - 145 mmol/L 140 141 137  Potassium 3.5 - 5.1 mmol/L 3.4(L) 3.5 3.8  Chloride 98 - 111 mmol/L 104 - -  CO2 22 - 32 mmol/L 25 - -   Calcium 8.9 - 10.3 mg/dL 8.3(L) - -  Total Protein 6.5 - 8.1 g/dL - - -  Total Bilirubin 0.3 - 1.2 mg/dL - - -  Alkaline Phos 38 - 126 U/L - - -  AST 15 - 41 U/L - - -  ALT 0 - 44 U/L - - -     Assessment/Plan: Patient Active Problem List   Diagnosis Date Noted  . Status post cesarean section 10/13/2018  . Hemorrhagic shock ([place]) 11-04-18  . Uterine rupture 11/04/18  . Neonatal death 2018-11-04  . Acute blood loss anemia 11-04-18  . Hypoxia   . Respiratory failure ([place])   . Cardiac arrest ([place])   . Gestational diabetes mellitus (GDM) 08/21/2018  . Obesity during pregnancy 08/17/2018  . H/O multiple gestation 05/08/2018  . Elevated blood pressure reading without diagnosis of hypertension 05/08/2018  . Supervision of other normal pregnancy, antepartum 04/24/2018  . History of cesarean delivery, currently pregnant 04/24/2018    Patient is 28 y.o. [id number] POD#2 s/p RCS at [redacted]w[redacted]d complicated by uterine rupture, significant blood loss with cardiac arrest during c-section. She appears remarkably well this am, appropriately tearful with mention of baby (passed) and chaplain. Due to void this am, encouraged ambulation as tolerated.   Hemorrhagic shock/cardiac arrest - s/p blood transfusion - CBC stable  Hypoxia - currently on 2 L [place] - wean as tolerated  Seizures - cont keppra per neuro - spoke with neurologist this am, no  need for MRI given quick recovery - s/p magnesium  Post partum care - pain meds prn - cont lovenox - abdomen softly distended, not passing flatus yet, will watch closely for signs of ileus - due to void this am, foley recently out  Neonatal demise - has been seen by chaplain and SW  GDMA1 - will need outpatient follow up    K. Therese SarahMeryl Davis, M.D. Attending [facility])  10/14/2018, 8:42 AM

## 2018-10-14 NOTE — Progress Notes (Signed)
Patient ID: Cheryl Fischer, female   DOB: 1990-07-02, 28 y.o.   MRN: [id number] Called by nurse to evaluate patient's left hand. Patient is found lying in bed in no acute distress. She admits to feeling well with the exception of her left hand. Patient states she noted left hand pain upon leaving the ICU 8/28. Patient had an IV on the dorsum of her hand which has since been removed. Left hand noted to be swollen this am by RN. Hand was elevated and warm compresses applied. Patient reports worsening in hand swelling, hand pain, numbness and inability to form a fist. Patient is otherwise without complaints. She has been ambulating and voiding   Blood pressure 110/83, pulse 76, temperature 98 F (36.7 C), temperature source Oral, resp. rate 18, weight 113.6 kg, last menstrual period 02/10/2018, SpO2 100 %. GENERAL: Well-developed, well-nourished female in no acute distress.  ABDOMEN: Softly distended, appropriately tender. Dressing clean, dry and intact EXTREMITIES: Swollen left hand, tender to touch, some areas of erythema. Erythema extend to inferior aspect of wrist  A/P 28 yo P6 POD#2 s/p repeat cesarean section complicated by uterine rupture and massive blood loss with asystole for 10 minutes - Continue hand elevation - Vascular surgery contacted to rule out phlebitis in the setting of urgent placement of an IV site - Continue routine postpartum care

## 2018-10-14 NOTE — Progress Notes (Signed)
Pt's left hand has been swollen since this morning. Interventions tried: heat and elevation. The swelling has gotten progressively worse throughout the day. Pt cannot bend hand all the way and it is tender to the touch. Areas of redness and bruising noted at the anterior left wrist. Notified Dr. Elly Modena to come to the bedside to assess it.

## 2018-10-15 ENCOUNTER — Inpatient Hospital Stay (HOSPITAL_COMMUNITY)
Admission: RE | Admit: 2018-10-15 | Discharge: 2018-10-15 | Disposition: A | Discharge status: Home / Self Care | Payer: Medicaid Other | Source: Ambulatory Visit

## 2018-10-15 LAB — CALCIUM, IONIZED: Calcium, Ionized, Serum: 4.7 mg/dL (ref 4.5–5.6)

## 2018-10-15 MED ORDER — ZOLPIDEM TARTRATE 5 MG PO TABS: 5.0000 mg | ORAL_TABLET | Freq: Every evening | ORAL | 0 refills | Status: DC | PRN

## 2018-10-15 MED ORDER — OXYCODONE HCL 5 MG PO TABS: 5.0000 mg | ORAL_TABLET | ORAL | 0 refills | Status: DC | PRN

## 2018-10-15 MED ORDER — LEVETIRACETAM 250 MG PO TABS: ORAL_TABLET | ORAL | 0 refills | Status: DC

## 2018-10-15 NOTE — Progress Notes (Addendum)
Discharge instructions and prescriptions given to pt.  Discussed post-op incision care, signs and symptoms to report to the MD, post-partum baby blues, breast care (tight-fitting bra and cabbage leaves), meds, and upcoming appointments.  Also educated pt to elevate left hand and apply ice to it. Pt verbalizes understanding and has no questions at this time. Consulted with House Coverage and Social Work and everything has been done. Pt is clear for discharge.

## 2018-10-15 NOTE — Progress Notes (Signed)
CSW verbally consulted by RN as pt requested Grief and Loss Resources. CSW spoke with pt and  significant other at bedside to offer resources. Pt expressed that they were able to call a few places yesterday regarding hosing in which they were very helpful. CSW encouraged pt and other members in the home to use Grief Resources given as needed. Pt expressed understanding and no further needs at this time.      Virgie Dad Chasmine Lender, MSW, LCSW [facility] [place] [facility] [place] [phone]

## 2018-10-15 NOTE — Discharge Instructions (Signed)
Postpartum Baby Blues The postpartum period begins right after the birth of a baby. During this time, there is often a lot of joy and excitement. It is also a time of many changes in the life of the parents. No matter how many times a mother gives birth, each child brings new challenges to the family, including different ways of relating to one another. It is common to have feelings of excitement along with confusing changes in moods, emotions, and thoughts. You may feel happy one minute and sad or stressed the next. These feelings of sadness usually happen in the period right after you have your baby, and they go away within a week or two. This is called the "baby blues." What are the causes? There is no known cause of baby blues. It is likely caused by a combination of factors. However, changes in hormone levels after childbirth are believed to trigger some of the symptoms. Other factors that can play a role in these mood changes include:  Lack of sleep.  Stressful life events, such as poverty, caring for a loved one, or death of a loved one.  Genetics. What are the signs or symptoms? Symptoms of this condition include:  Brief changes in mood, such as going from extreme happiness to sadness.  Decreased concentration.  Difficulty sleeping.  Crying spells and tearfulness.  Loss of appetite.  Irritability.  Anxiety. If the symptoms of baby blues last for more than 2 weeks or become more severe, you may have postpartum depression. How is this diagnosed? This condition is diagnosed based on an evaluation of your symptoms. There are no medical or lab tests that lead to a diagnosis, but there are various questionnaires that a health care provider may use to identify women with the baby blues or postpartum depression. How is this treated? Treatment is not needed for this condition. The baby blues usually go away on their own in 1-2 weeks. Social support is often all that is needed. You will  be encouraged to get adequate sleep and rest. Follow these instructions at home: Lifestyle      Get as much rest as you can. Take a nap when the baby sleeps.  Exercise regularly as told by your health care provider. Some women find yoga and walking to be helpful.  Eat a balanced and nourishing diet. This includes plenty of fruits and vegetables, whole grains, and lean proteins.  Do little things that you enjoy. Have a cup of tea, take a bubble bath, read your favorite magazine, or listen to your favorite music.  Avoid alcohol.  Ask for help with household chores, cooking, grocery shopping, or running errands. Do not try to do everything yourself. Consider hiring a postpartum doula to help. This is a professional who specializes in providing support to new mothers.  Try not to make any major life changes during pregnancy or right after giving birth. This can add stress. General instructions  Talk to people close to you about how you are feeling. Get support from your partner, family members, friends, or other new moms. You may want to join a support group.  Find ways to cope with stress. This may include: ? Writing your thoughts and feelings in a journal. ? Spending time outside. ? Spending time with people who make you laugh.  Try to stay positive in how you think. Think about the things you are grateful for.  Take over-the-counter and prescription medicines only as told by your health care provider.    Let your health care provider know if you have any concerns.  Keep all postpartum visits as told by your health care provider. This is important. Contact a health care provider if:  Your baby blues do not go away after 2 weeks. Get help right away if:  You have thoughts of taking your own life (suicidal thoughts).  You think you may harm the baby or other people.  You see or hear things that are not there (hallucinations). Summary  After giving birth, you may feel happy  one minute and sad or stressed the next. Feelings of sadness that happen right after the baby is born and go away after a week or two are called the "baby blues."  You can manage the baby blues by getting enough rest, eating a healthy diet, exercising, spending time with supportive people, and finding ways to cope with stress.  If feelings of sadness and stress last longer than 2 weeks or get in the way of caring for your baby, talk to your health care provider. This may mean you have postpartum depression. This information is not intended to replace advice given to you by your health care provider. Make sure you discuss any questions you have with your health care provider. Document Released: 11/06/2003 Document Revised: 05/26/2018 Document Reviewed: 03/30/2016 Elsevier Patient Education  2020 [employer] Cesarean Delivery, Perinatal Loss, Care After This sheet gives you information about how to care for yourself after your procedure. Your health care provider may also give you more specific instructions. If you have problems or questions, contact your health care provider. What can I expect after the procedure? After the procedure, it is common to have:  A small amount of blood or clear fluid coming from the incision.  Some redness, swelling, and pain in your incision area. The incision may be tender to the touch for several weeks.  Some abdominal pain and soreness.  Some bleeding and discharge from your vagina.  Pelvic cramps.  Tiredness (fatigue).  Pain, swelling, and discomfort in the tissue between your vagina and your anus (perineum) if: ? Your C-section was unplanned, and you were allowed to labor and push. ? An incision was made in the area (episiotomy) or the tissue tore during attempted vaginal delivery.  Emotions that change quickly. Common emotions include sadness, anger, denial, guilt, depression, and grief. Follow these instructions at home: Incision care   Follow  instructions from your health care provider about how to take care of your incision. Make sure you: ? Wash your hands with soap and water before and after you change your bandage (dressing). If soap and water are not available, use hand sanitizer. ? Change your dressing as told by your health care provider. ? Leave stitches (sutures), skin glue, or adhesive strips in place. These skin closures may need to stay in place for 2 weeks or longer. If adhesive strip edges start to loosen and curl up, you may trim the loose edges. Do not remove adhesive strips.  Check your incision area every day for signs of infection. Check for: ? More redness, swelling, or pain. ? More fluid or blood. ? Warmth. ? Pus or a bad smell.  Do not take baths, swim, or use a hot tub until your health care provider approves. Shower as instructed by your health care provider.  When you cough or sneeze, hug a pillow. This helps with pain and lessens the chance of your incision opening up. Do this until your incision heals. Medicines  Take over-the-counter and prescription medicines only as told by your health care provider.  If you were prescribed an antibiotic medicine, take it as told by your health care provider. Do not stop taking the antibiotic even if you start to feel better.  Do not drive or use heavy machinery while taking prescription pain medicine. Lifestyle  Do not drink alcohol.  Do not use any products that contain nicotine or tobacco, such as cigarettes, e-cigarettes, and chewing tobacco. If you need help quitting, ask your health care provider. Eating and drinking  Drink enough fluids to keep your urine pale yellow.  Eat high-fiber foods every day. These foods may help prevent or relieve constipation. High-fiber foods include: ? Fresh fruits and vegetables. ? Whole grains. ? Beans. Activity   If possible, have someone help you with your household activities for at least a few days after you leave  the hospital.  Rest as much as possible and increase your activities gradually. Follow your health care provider's instructions about resuming activities such as climbing stairs, driving, lifting, exercising, or traveling.  Do not lift anything that is heavier than 10 lb (4.5 kg), or the limit that you are told, until your health care provider says that it is safe.  Talk with your health care provider about when you can engage in sexual activity. This may depend on: ? Your risk of infection. ? How fast you heal. ? Your comfort and desire to engage in sexual activity. Emotional support  Consider seeking support for your loss. Some forms of support that you might consider include your religious leader, friends, family, a Pharmacist, hospital, or a bereavement support group.  Do not use drugs or alcohol to cope with stress or emotions. If you need help avoiding these substances, talk with your health care provider. General instructions  Do not use tampons, douche, or place anything in your vagina until your health care provider says it is okay.  Wear: ? Loose, comfortable clothing. ? A supportive and well-fitting bra.  Keep your perineum clean and dry. Wipe from front to back when you use the toilet.  If you pass a blood clot, save it and call your health care provider to discuss. Do not flush blood clots down the toilet before you get instructions from your health care provider.  Keep all follow-up visits as told by your health care provider. This is important. Contact a health care provider if:  You have: ? A fever. ? Bad-smelling vaginal discharge. ? Pus or a bad smell coming from your incision. ? More redness, swelling, or pain around your incision. ? More fluid or blood coming from your incision. ? Difficulty or pain when urinating. ? A sudden increase or decrease in the frequency of your bowel movements. ? A rash. ? Nausea or vomiting. ? Little or no interest in  activities you used to enjoy.  You feel warmth around your incision.  You feel unusually sad or worried.  You urinate more than usual.  You are dizzy or light-headed. Get help right away if:  You have: ? Pain that does not go away or get better with medicine. ? Chest pain. ? Trouble breathing. ? Blurred vision or spots in your vision. ? Abdominal pain. ? Sudden, severe leg pain. ? A severe headache.  You faint.  You bleed from your vagina so much that you fill more than one sanitary pad in one hour. Bleeding should not be more than your heaviest period. If you ever feel  like you may hurt yourself or others, or have thoughts about taking your own life, get help right away. You can go to your nearest emergency department or call:  Your local emergency services (911 in the [place]  A suicide crisis helpline, such as the National Suicide Prevention Lifeline at (807) 311-97751-(262)704-7252. This is open 24 hours a day. Summary  After the procedure, it is common to have pain at your incision site, pelvic cramping, and some bleeding from your vagina.  Check your incision area every day for signs of infection.  Tell your health care provider about any unusual symptoms.  Consider getting support for your loss. Sources of support include religious leaders, friends, family, professional counselors, and bereavement support groups.  Keep all follow-up visits as told by your health care provider. This is important. This information is not intended to replace advice given to you by your health care provider. Make sure you discuss any questions you have with your health care provider. Document Released: 06/18/2013 Document Revised: 10/21/2017 Document Reviewed: 08/10/2017 Elsevier Patient Education  2020 [employer] Exploratory Laparotomy, Adult, Care After This sheet gives you information about how to care for yourself after your procedure. Your health care provider may also give you more specific  instructions. If you have problems or questions, contact your health care provider. What can I expect after the procedure? After the procedure, it is common to have:  Abdominal soreness.  Fatigue.  A sore throat from the tube in your throat.  A lack of appetite. Follow these instructions at home: Medicines  Take over-the-counter and prescription medicines only as told by your health care provider.  If you were prescribed an antibiotic medicine, take it as told by your health care provider. Do not stop taking the antibiotic even if you start to feel better.  Do not drive or operate heavy machinery while taking pain medicine.  If you are taking prescription pain medicine, take actions to prevent or treat constipation. Your health care provider may recommend that you: ? Drink enough fluid to keep your urine pale yellow. ? Eat foods that are high in fiber, such as fresh fruits and vegetables, whole grains, and beans. ? Limit foods that are high in fat and processed sugars, such as fried or sweet foods. ? Take an over-the-counter or prescription medicine for constipation. Undergoing surgery and taking pain medicines can make constipation worse. Incision care   Follow instructions from your health care provider about how to take care of your incision. Make sure you: ? Wash your hands with soap and water before you change your bandage (dressing). If soap and water are not available, use hand sanitizer. ? Change your dressing as told by your health care provider. ? Leave stitches (sutures), skin glue, or adhesive strips in place. These skin closures may need to stay in place for 2 weeks or longer. If adhesive strip edges start to loosen and curl up, you may trim the loose edges. Do not remove adhesive strips completely unless your health care provider tells you to do that.  If you were sent home with a drain, follow instructions from your health care provider about how to care for  it.  Check your incision area every day for signs of infection. Check for: ? Redness, swelling, or pain. ? Fluid or blood. ? Warmth. ? Pus or a bad smell. Activity   Rest as told by your health care provider. ? Avoid sitting for a long time without moving. Get up to  take short walks every 1-2 hours. This is important to improve blood flow and breathing. Ask for help if you feel weak or unsteady.  Do not lift anything that is heavier than 5 lb (2.2 kg), or the limit that your health care provider tells you, until he or she says that it is safe.  Ask your health care provider when you can start to do your usual activities again, such as driving, going back to work, and having sex. Eating and drinking  You may eat what you usually eat. Include lots of whole grains, fruits, and vegetables in your diet. This will help to prevent constipation.  Drink enough fluid to keep your urine pale yellow. Bathing  Keep your incision clean and dry. Clean it as often as told by your health care provider: ? Gently wash the incision with soap and water. ? Rinse the incision with water to remove all soap. ? Pat the incision dry with a clean towel. Do not rub the incision.  You may take showers after 48 hours.  Do not take baths, swim, or use a hot tub until your health care provider says it is okay to do so. General instructions  Do not use any products that contain nicotine or tobacco, such as cigarettes and e-cigarettes. These can delay healing after surgery. If you need help quitting, ask your health care provider.  Wear compression stockings as told by your health care provider. These stockings help to prevent blood clots and reduce swelling in your legs.  Keep all follow-up visits as told by your health care provider. This is important. Contact a health care provider if:  You have a fever.  You have chills.  Your pain medicine is not helping.  You have constipation or diarrhea.  You  have nausea or vomiting.  You have drainage, redness, swelling, or pain at your incision site. Get help right away if:  Your pain is getting worse.  You have not had a bowel movement for more than 3 days.  You have ongoing (persistent) vomiting.  The edges of your incision open up.  You have warmth, tenderness, and swelling in your calf.  You have trouble breathing.  You have chest pain. These symptoms may represent a serious problem that is an emergency. Do not wait to see if the symptoms will go away. Get medical help right away. Call your local emergency services (911 in the [place]). Do not drive yourself to the hospital. Summary  Abdominal soreness is common after exploratory laparotomy. Take over-the-counter and prescription pain medicines only as told by your health care provider.  Follow instructions from your health care provider about how to take care of your incision. Do not take baths, swim, or use a hot tub until your health care provider says it is okay to do so.  Watch for signs and symptoms of infection after surgery, including fever, chills, drainage from your incision, and worsening abdominal pain. This information is not intended to replace advice given to you by your health care provider. Make sure you discuss any questions you have with your health care provider. Document Released: 09/16/2003 Document Revised: 03/27/2018 Document Reviewed: 02/11/2017 Elsevier Patient Education  2020 [employer].

## 2018-10-16 ENCOUNTER — Other Ambulatory Visit (HOSPITAL_COMMUNITY): Payer: Medicaid Other

## 2018-10-18 ENCOUNTER — Telehealth: Payer: Self-pay

## 2018-10-18 DIAGNOSIS — F4321 Adjustment disorder with depressed mood: Secondary | ICD-10-CM

## 2018-10-18 NOTE — Telephone Encounter (Signed)
Received call from [facility] baby nurse regarding patient. Patient reach out to nurse last night reporting she thinks she would benefits from some counseling since losing her baby. Tera Helper reports not getting a postpartum screening on her due to patient driving in the car. Patient has a follow up/postpartum appointment on 10/19/2018 with Dr.Constant. I will go ahead and place a referral to [facility]) so that patient can get in to see her.

## 2018-10-19 ENCOUNTER — Encounter: Payer: Self-pay | Admitting: Obstetrics and Gynecology

## 2018-10-19 ENCOUNTER — Other Ambulatory Visit: Payer: Self-pay

## 2018-10-19 ENCOUNTER — Telehealth (INDEPENDENT_AMBULATORY_CARE_PROVIDER_SITE_OTHER): Payer: Medicaid Other | Admitting: Clinical

## 2018-10-19 ENCOUNTER — Ambulatory Visit (INDEPENDENT_AMBULATORY_CARE_PROVIDER_SITE_OTHER): Payer: Medicaid Other | Admitting: Obstetrics and Gynecology

## 2018-10-19 VITALS — BP 112/77 | HR 93 | Wt 234.0 lb

## 2018-10-19 DIAGNOSIS — F53 Postpartum depression: Secondary | ICD-10-CM

## 2018-10-19 DIAGNOSIS — F4321 Adjustment disorder with depressed mood: Secondary | ICD-10-CM

## 2018-10-19 DIAGNOSIS — O99345 Other mental disorders complicating the puerperium: Secondary | ICD-10-CM

## 2018-10-19 DIAGNOSIS — Z9889 Other specified postprocedural states: Secondary | ICD-10-CM

## 2018-10-19 MED ORDER — OXYCODONE-ACETAMINOPHEN 5-325 MG PO TABS: 1.0000 | ORAL_TABLET | Freq: Four times a day (QID) | ORAL | 0 refills | Status: DC | PRN

## 2018-10-19 MED ORDER — SERTRALINE HCL 50 MG PO TABS: 50.0000 mg | ORAL_TABLET | Freq: Every day | ORAL | 3 refills | Status: DC

## 2018-10-19 NOTE — Patient Instructions (Signed)
Please go to maternity admission on Sunday 10/22/2018 for staple removal.

## 2018-10-19 NOTE — Progress Notes (Signed)
28 yo P5 s/p emergent cesarean section on 8/27 with fetal demise due to uterine rupture. Patient was also pulseless for 10 minutes and required ACLS. Patient reports persistent incisional pain without drainage or fever. Patient reports continue ambulation and tolerating a regular diet. She is voiding and has had a bowel movement. She admits to depression without suicidal ideation. She states that her entire family is not coping with the loss very well.   Past Medical History:  Diagnosis Date  . Anemia   . Blood transfusion without reported diagnosis   . Vaginal Pap smear, abnormal    Past Surgical History:  Procedure Laterality Date  . CESAREAN SECTION    . CESAREAN SECTION N/A 10/12/2018   Procedure: CESAREAN SECTION;  Surgeon: Donnamae Jude, MD;  Location: [facility] LD ORS;  Service: Obstetrics;  Laterality: N/A;   Family History  Problem Relation Age of Onset  . Cancer Paternal Grandmother    Social History   Tobacco Use  . Smoking status: Never Smoker  . Smokeless tobacco: Never Used  Substance Use Topics  . Alcohol use: Never    Frequency: Never  . Drug use: Never   ROS See pertinent in HPI  Blood pressure 112/77, pulse 93, weight 234 lb (106.1 kg), last menstrual period 02/10/2018. GENERAL: Well-developed, well-nourished female in no acute distress.  ABDOMEN: Soft, nontender, nondistended. Incision: vertical incision healing well with staples in place. No erythema, induration or drainage EXTREMITIES: No cyanosis, clubbing, or edema, 2+ distal pulses.    A/P 28 yo POD#7 s/p emergent cesarean section with uterine rupture and fetal loss - Patient to follow up to MAU on Sunday for staple removal given body habitus - Complete Keppra taper - Rx zoloft provided with referral to see [name] - Follow up in 4 weeks for postpartum visit

## 2018-10-19 NOTE — BH Specialist Note (Signed)
Integrated Behavioral Health via Telemedicine Video Visit  10/19/2018 Cheryl Fischer [id number]  Number of Social Circle visits: 1 Session Start time: 2:53  Session End time: 3:37 Total time: 45 minutes  Referring Provider: Mora Bellman, MD Type of Visit: Telephone Patient/Family location: Home [facility] Provider location: [facility] All persons participating in visit: Patient Cheryl Fischer, [place], and MSW Intern Audria Nine  Confirmed patient's address: Yes  Confirmed patient's phone number: Yes  Any changes to demographics: No   Confirmed patient's insurance: Yes  Any changes to patient's insurance: No   Discussed confidentiality: Yes   I connected with [name] by a video enabled telemedicine application and verified that I am speaking with the correct person using two identifiers.     I discussed the limitations of evaluation and management by telemedicine and the availability of in person appointments.  I discussed that the purpose of this visit is to provide behavioral health care while limiting exposure to the novel coronavirus.   Discussed there is a possibility of technology failure and discussed alternative modes of communication if that failure occurs.  I discussed that engaging in this video visit, they consent to the provision of behavioral healthcare and the services will be billed under their insurance.  Patient and/or legal guardian expressed understanding and consented to video visit: Yes   PRESENTING CONCERNS: Patient and/or family reports the following symptoms/concerns: Pt states she is grieving the loss of her son, and unable to sleep. Pt says she is unable to obtain Ambien because Medicaid will not cover, and is requesting an alternate medication that will help her to sleep during her grief. No SI.  Duration of problem: One week; Severity of problem: severe  STRENGTHS (Protective Factors/Coping Skills): -  GOALS ADDRESSED: Patient  will: 1.  Demonstrate ability to: Begin healthy grieving over loss  INTERVENTIONS: Interventions utilized:  Supportive Counseling and Link to [employer] Standardized Assessments completed: Not Needed  ASSESSMENT: Patient currently experiencing Grief.   Patient may benefit from brief therapeutic interventions today.  PLAN: 1. Follow up with behavioral health clinician on : One week 2. Behavioral recommendations:  -Begin taking BH medications as prescribed -Consider sleep as priority for one week -Be kind to self during grieving 3. Referral(s): Integrated Orthoptist (In Clinic) and [employer] Resources:  Grief support  I discussed the assessment and treatment plan with the patient and/or parent/guardian. They were provided an opportunity to ask questions and all were answered. They agreed with the plan and demonstrated an understanding of the instructions.   They were advised to call back or seek an in-person evaluation if the symptoms worsen or if the condition fails to improve as anticipated.  Caroleen Hamman [name]

## 2018-10-20 ENCOUNTER — Other Ambulatory Visit: Payer: Self-pay | Admitting: Obstetrics and Gynecology

## 2018-10-20 MED ORDER — TRAZODONE HCL 50 MG PO TABS: 50.0000 mg | ORAL_TABLET | Freq: Every day | ORAL | 1 refills | Status: DC

## 2018-10-22 ENCOUNTER — Other Ambulatory Visit: Payer: Self-pay

## 2018-10-22 ENCOUNTER — Inpatient Hospital Stay (HOSPITAL_COMMUNITY)
Admission: AD | Admit: 2018-10-22 | Discharge: 2018-10-22 | Payer: Medicaid Other | Attending: Obstetrics & Gynecology | Admitting: Obstetrics & Gynecology

## 2018-10-22 ENCOUNTER — Encounter (HOSPITAL_COMMUNITY): Payer: Self-pay

## 2018-10-22 DIAGNOSIS — Z79899 Other long term (current) drug therapy: Secondary | ICD-10-CM | POA: Insufficient documentation

## 2018-10-22 DIAGNOSIS — O34219 Maternal care for unspecified type scar from previous cesarean delivery: Secondary | ICD-10-CM | POA: Insufficient documentation

## 2018-10-22 DIAGNOSIS — Z4889 Encounter for other specified surgical aftercare: Secondary | ICD-10-CM

## 2018-10-22 DIAGNOSIS — E669 Obesity, unspecified: Secondary | ICD-10-CM | POA: Insufficient documentation

## 2018-10-22 NOTE — MAU Note (Signed)
Cheryl Fischer is a 28 y.o. at [redacted]w[redacted]d here in MAU reporting: for follow-up in MAU for staple removal per previous note by Dr. Elly Modena. Emergent C/S 10/12/2018.  Pain score: 9/10. Incisional pain. Achy/sore. Worse with activity Vitals:   10/22/18 0812 10/22/18 0908  BP: 103/74 124/75  Pulse: 90 77  Resp: 20 20  Temp: 98 F (36.7 C) 97.6 F (36.4 C)  SpO2: 99% 98%      Lab orders placed from triage: none

## 2018-10-22 NOTE — ED Triage Notes (Signed)
Pt had c-section 1 week ago and has staples around incision. Here for staple removal. No other complaints. VSS.

## 2018-10-22 NOTE — MAU Provider Note (Signed)
Chief Complaint: Suture / Staple Removal   First Provider Initiated Contact with Patient 10/22/18 0931     SUBJECTIVE HPI: Cheryl Fischer is a 28 y.o. [phone] female who is postop day 10 from an emergency cesarean delivery with uterine rupture and fetal demise at 38.2 wks was [facility]. [facility] not knowing that she was to return here for the procedure. Per the PA note from [facility], the "wound does not appear to be fully healed". Patient to have MAU provider evaluate incision. She reports mild pain in incision when she moves around, but it is well-managed with ibuprofen and percocet.  Location: abdomen Quality: sharp, achy, sore Severity: 9/10 on pain scale Duration: 10/12/2018 Modifying factors: movement increases pain  Past Medical History:  Diagnosis Date  . Anemia   . Blood transfusion without reported diagnosis   . Vaginal Pap smear, abnormal    OB History  Gravida Para Term Preterm AB Living  6 6 5 1   6   SAB TAB Ectopic Multiple Live Births        2 6    # Outcome Date GA Lbr Len/2nd Weight Sex Delivery Anes PTL Lv  6 Term 10/12/18 3165w2d  3204 g M CS-LVertical Gen  FD  5 Term           4A Preterm  4741w0d    CS-LVertical   LIV  4B Preterm  7741w0d    CS-LVertical   LIV  3A Term      CS-LVertical   LIV  3B Term      CS-LVertical   LIV  2 Term      CS-LVertical   LIV  1 Term      CS-LVertical   LIV    Obstetric Comments  Twins   Past Surgical History:  Procedure Laterality Date  . CESAREAN SECTION    . CESAREAN SECTION N/A 10/12/2018   Procedure: CESAREAN SECTION;  Surgeon: Reva BoresPratt, Tanya S, MD;  Location: [facility];  Service: Obstetrics;  Laterality: N/A;   Social History   Socioeconomic History  . Marital status: Single    Spouse name: Not on file  . Number of children: 7  . Years of education: Not on file  . Highest education level: Not on file  Occupational History  . Not on file  Social Needs  .  Financial resource strain: Not on file  . Food insecurity    Worry: Not on file    Inability: Not on file  . Transportation needs    Medical: Not on file    Non-medical: Not on file  Tobacco Use  . Smoking status: Never Smoker  . Smokeless tobacco: Never Used  Substance and Sexual Activity  . Alcohol use: Never    Frequency: Never  . Drug use: Never  . Sexual activity: Yes    Birth control/protection: None    Comment: last sex 17 Aug 20  Lifestyle  . Physical activity    Days per week: Not on file    Minutes per session: Not on file  . Stress: Not on file  Relationships  . Social Musicianconnections    Talks on phone: Not on file    Gets together: Not on file    Attends religious service: Not on file    Active member of club or organization: Not on file    Attends meetings of clubs or organizations: Not on file  Relationship status: Not on file  . Intimate partner violence    Fear of current or ex partner: Not on file    Emotionally abused: Not on file    Physically abused: Not on file    Forced sexual activity: Not on file  Other Topics Concern  . Not on file  Social History Narrative  . Not on file   Current Facility-Administered Medications on File Prior to Encounter  Medication Dose Route Frequency Provider Last Rate Last Dose  . 0.9 %  sodium chloride infusion    Continuous PRN Genevie Ann, CRNA   250 mL at 10/13/18 1238  . calcium gluconate inj 10% (1 g) URGENT USE ONLY!    Anesthesia Intra-op Genevie Ann, CRNA   1 g at 10/12/18 2008  . EPINEPHrine (ADRENALIN)    Anesthesia Intra-op Genevie Ann, CRNA   1 mg at 10/12/18 2003  . fentaNYL (SUBLIMAZE) injection    Anesthesia Intra-op Genevie Ann, CRNA   250 mcg at 10/12/18 2122  . lactated ringers infusion    Continuous PRN Genevie Ann, CRNA   Stopped at 10/12/18 2102  . midazolam (VERSED) injection    Anesthesia Intra-op Genevie Ann, CRNA   2 mg at  10/12/18 2124  . oxytocin (PITOCIN) 40 Units in sodium chloride 0.9 % 1,000 mL infusion    Continuous PRN Genevie Ann, CRNA   40 Units at 10/12/18 1958  . phenylephrine (NEO-SYNEPHRINE) injection    Anesthesia Intra-op Genevie Ann, CRNA   400 mcg at 10/12/18 2306  . propofol (DIPRIVAN) 10 mg/mL bolus/IV push    Anesthesia Intra-op Genevie Ann, CRNA   200 mg at 10/12/18 1949  . rocuronium [facility]) injection    Anesthesia Intra-op Genevie Ann, CRNA   30 mg at 10/12/18 2124  . succinylcholine (ANECTINE) injection    Anesthesia Intra-op Genevie Ann, CRNA   100 mg at 10/12/18 1949   Current Outpatient Medications on File Prior to Encounter  Medication Sig Dispense Refill  . ibuprofen (ADVIL) 200 MG tablet Take 200 mg by mouth every 6 (six) hours as needed.    . levETIRAcetam (KEPPRA) 250 MG tablet One tablet in the morning and two at night for 3 days. One tablet by mouth twice a day for 3 days and then one at bedtime for 3 days 18 tablet 0  . oxyCODONE (OXY IR/ROXICODONE) 5 MG immediate release tablet Take 1-2 tablets (5-10 mg total) by mouth every 4 (four) hours as needed for severe pain. 30 tablet 0  . oxyCODONE-acetaminophen (PERCOCET/ROXICET) 5-325 MG tablet Take 1 tablet by mouth every 6 (six) hours as needed. 20 tablet 0  . Prenat w/o A Vit-FeFum-FePo-FA (CONCEPT OB) 130-92.4-1 MG CAPS Take 1 capsule by mouth daily. 30 capsule 11  . sertraline (ZOLOFT) 50 MG tablet Take 1 tablet (50 mg total) by mouth daily. 30 tablet 3  . traZODone (DESYREL) 50 MG tablet Take 1 tablet (50 mg total) by mouth at bedtime. 30 tablet 1  . zolpidem (AMBIEN) 5 MG tablet Take 1 tablet (5 mg total) by mouth at bedtime as needed for sleep. (Patient not taking: Reported on 10/19/2018) 30 tablet 0   Allergies  Allergen Reactions  . Penicillins Hives, Shortness Of Breath and Swelling    Did it involve swelling of the face/tongue/throat, SOB, or low BP? Yes Did it  involve sudden or severe rash/hives, skin peeling, or any reaction on the inside of your mouth  or nose? Yes Did you need to seek medical attention at a hospital or doctor's office? Unknown When did it last happen? Childhood reaction If all above answers are "NO", may proceed with cephalosporin use.     I have reviewed the past Medical Hx, Surgical Hx, Social Hx, Allergies and Medications.   Review of Systems  Constitutional: Negative.   HENT: Negative.   Eyes: Negative.   Respiratory: Negative.   Cardiovascular: Negative.   Gastrointestinal: Negative.   Genitourinary: Negative.   Musculoskeletal: Negative.   Skin:       "pain in incision"  Neurological: Negative.   Endo/Heme/Allergies: Negative.   Psychiatric/Behavioral: Negative.     OBJECTIVE Patient Vitals for the past 24 hrs:  BP Temp Temp src Pulse Resp SpO2 Weight  10/22/18 0908 124/75 97.6 F (36.4 C) Oral 77 20 98 % 101 kg  10/22/18 0812 103/74 98 F (36.7 C) Oral 90 20 99 % -   Constitutional: Well-developed, well-nourished female in no acute distress.  Cardiovascular: normal rate Respiratory: normal rate and effort.  GI: Abd soft, non-tender, some bruising periumbilical. Vertical incision well-approximated with staples, staples removed without difficulty, some separation of skin in small areas down the incision after removal of staples, Steri-strips applied. Pos BS x 4 MS: Extremities nontender, no edema, normal ROM Neurologic: Alert and oriented x 4.   MAU COURSE - Staple removal by CNM  MDM  ASSESSMENT 1. Encounter for postoperative wound check     PLAN Discharge home in stable condition. Wound Care instructions given  - Advised to keep your steri-strips clean, dry and intact.  After showering, pat dry with a towel  followed by drying with a hair dryer on low/cool setting until completely dry. F/U as scheduled  Allergies as of 10/22/2018      Reactions   Penicillins Hives, Shortness Of Breath,  Swelling   Did it involve swelling of the face/tongue/throat, SOB, or low BP? Yes Did it involve sudden or severe rash/hives, skin peeling, or any reaction on the inside of your mouth or nose? Yes Did you need to seek medical attention at a hospital or doctor's office? Unknown When did it last happen? Childhood reaction If all above answers are "NO", may proceed with cephalosporin use.      Medication List    TAKE these medications   Concept OB 130-92.4-1 MG Caps Take 1 capsule by mouth daily.   ibuprofen 200 MG tablet Commonly known as: ADVIL Take 200 mg by mouth every 6 (six) hours as needed.   levETIRAcetam 250 MG tablet Commonly known as: KEPPRA One tablet in the morning and two at night for 3 days. One tablet by mouth twice a day for 3 days and then one at bedtime for 3 days   oxyCODONE 5 MG immediate release tablet Commonly known as: Oxy IR/ROXICODONE Take 1-2 tablets (5-10 mg total) by mouth every 4 (four) hours as needed for severe pain.   oxyCODONE-acetaminophen 5-325 MG tablet Commonly known as: PERCOCET/ROXICET Take 1 tablet by mouth every 6 (six) hours as needed.   sertraline 50 MG tablet Commonly known as: Zoloft Take 1 tablet (50 mg total) by mouth daily.   traZODone 50 MG tablet Commonly known as: DESYREL Take 1 tablet (50 mg total) by mouth at bedtime.   zolpidem 5 MG tablet Commonly known as: AMBIEN Take 1 tablet (5 mg total) by mouth at bedtime as needed for sleep.        Raelyn Mora, CNM 10/22/2018 9:46  AM

## 2018-10-22 NOTE — ED Provider Notes (Signed)
[facility] EMERGENCY DEPARTMENT Provider Note   CSN: [id number] Arrival date & time: 10/22/18  0803     History   Chief Complaint Chief Complaint  Patient presents with  . Suture / Staple Removal    HPI Cheryl Fischer is a 28 y.o. female presents today postop day 10.  Patient underwent cesarean section on 10-26-2022 with fetal demise and uterine rupture.  On review of OB/GYN's note appears patient was pulseless for 10 minutes and required ACLS during that visit.  According to OB/GYN Dr. Elly Modena note from 10/19/2018 it appears patient was to follow-up in MAU today for staple removal and wound recheck.  Patient reports to me that she is feeling well today and has no other complaints other than request for her staple removal.  She denies any fever/chills, drainage, pain or any additional concerns today.    HPI  Past Medical History:  Diagnosis Date  . Anemia   . Blood transfusion without reported diagnosis   . Vaginal Pap smear, abnormal     Patient Active Problem List   Diagnosis Date Noted  . Status post cesarean section 10/13/2018  . Hemorrhagic shock ([place]) 2018/10/26  . Uterine rupture 2018/10/26  . Neonatal death 26-Oct-2018  . Acute blood loss anemia October 26, 2018  . Hypoxia   . Respiratory failure ([place])   . Cardiac arrest ([place])   . Gestational diabetes mellitus (GDM) 08/21/2018  . Obesity during pregnancy 08/17/2018  . H/O multiple gestation 05/08/2018  . Elevated blood pressure reading without diagnosis of hypertension 05/08/2018  . Supervision of other normal pregnancy, antepartum 04/24/2018  . History of cesarean delivery, currently pregnant 04/24/2018    Past Surgical History:  Procedure Laterality Date  . CESAREAN SECTION    . CESAREAN SECTION N/A 26-Oct-2018   Procedure: CESAREAN SECTION;  Surgeon: Donnamae Jude, MD;  Location: [facility];  Service: Obstetrics;  Laterality: N/A;     OB History    Gravida  6   Para  6   Term  5   Preterm  1   AB      Living  6     SAB      TAB      Ectopic      Multiple  2   Live Births  6        Obstetric Comments  Twins         Home Medications    Prior to Admission medications   Medication Sig Start Date End Date Taking? Authorizing Provider  ibuprofen (ADVIL) 200 MG tablet Take 200 mg by mouth every 6 (six) hours as needed.    [provider]  levETIRAcetam (KEPPRA) 250 MG tablet One tablet in the morning and two at night for 3 days. One tablet by mouth twice a day for 3 days and then one at bedtime for 3 days 10/15/18   Woodroe Mode, MD  oxyCODONE (OXY IR/ROXICODONE) 5 MG immediate release tablet Take 1-2 tablets (5-10 mg total) by mouth every 4 (four) hours as needed for severe pain. 10/15/18   Woodroe Mode, MD  oxyCODONE-acetaminophen (PERCOCET/ROXICET) 5-325 MG tablet Take 1 tablet by mouth every 6 (six) hours as needed. 10/19/18   Constant, Peggy, MD  Prenat w/o A Vit-FeFum-FePo-FA (CONCEPT OB) 130-92.4-1 MG CAPS Take 1 capsule by mouth daily. 09/25/18   Emily Filbert, MD  sertraline (ZOLOFT) 50 MG tablet Take 1 tablet (50 mg total) by mouth daily. 10/19/18   Constant, Peggy,  MD  traZODone (DESYREL) 50 MG tablet Take 1 tablet (50 mg total) by mouth at bedtime. 10/20/18   Constant, Peggy, MD  zolpidem (AMBIEN) 5 MG tablet Take 1 tablet (5 mg total) by mouth at bedtime as needed for sleep. Patient not taking: Reported on 10/19/2018 10/15/18   Adam PhenixArnold, James G, MD    Family History Family History  Problem Relation Age of Onset  . Cancer Paternal Grandmother     Social History Social History   Tobacco Use  . Smoking status: Never Smoker  . Smokeless tobacco: Never Used  Substance Use Topics  . Alcohol use: Never    Frequency: Never  . Drug use: Never     Allergies   Penicillins   Review of Systems Review of Systems  Constitutional: Negative.  Negative for chills and fever.  Gastrointestinal: Negative.  Negative for abdominal pain, nausea and vomiting.   Skin: Negative.  Negative for rash.  Neurological: Negative.  Negative for weakness.    Physical Exam Updated Vital Signs BP 103/74 (BP Location: Left Arm)   Pulse 90   Temp 98 F (36.7 C) (Oral)   Resp 20   LMP 02/10/2018 (Approximate)   SpO2 99%   Physical Exam Constitutional:      General: She is not in acute distress.    Appearance: Normal appearance. She is well-developed. She is obese. She is not ill-appearing or diaphoretic.  HENT:     Head: Normocephalic and atraumatic.     Right Ear: External ear normal.     Left Ear: External ear normal.     Nose: Nose normal.  Eyes:     General: Vision grossly intact. Gaze aligned appropriately.     Pupils: Pupils are equal, round, and reactive to light.  Neck:     Musculoskeletal: Normal range of motion.     Trachea: Trachea and phonation normal. No tracheal deviation.  Pulmonary:     Effort: Pulmonary effort is normal. No respiratory distress.  Abdominal:     General: There is no distension.     Palpations: Abdomen is soft.     Tenderness: There is no abdominal tenderness. There is no guarding or rebound.  Musculoskeletal: Normal range of motion.  Skin:    General: Skin is warm and dry.          Comments: Staples present consistent with cesarean section.  Wound does not appear to be fully healed there is mild erythema present, granulation tissue and minimal separation.  No drainage or tenderness to palpation, no induration or fluctuance.  Neurological:     Mental Status: She is alert.     GCS: GCS eye subscore is 4. GCS verbal subscore is 5. GCS motor subscore is 6.     Comments: Speech is clear and goal oriented, follows commands Major Cranial nerves without deficit, no facial droop Moves extremities without ataxia, coordination intact  Psychiatric:        Behavior: Behavior normal.      ED Treatments / Results  Labs (all labs ordered are listed, but only abnormal results are displayed) Labs Reviewed - No data  to display  EKG None  Radiology No results found.  Procedures Procedures (including critical care time)  Medications Ordered in ED Medications - No data to display   Initial Impression / Assessment and Plan / ED Course  I have reviewed the triage vital signs and the nursing notes.  Pertinent labs & imaging results that were available during my care of  the patient were reviewed by me and considered in my medical decision making (see chart for details).    28 year old female presents today for suture removal after cesarean section 10 days ago.  Her wound does not appear to be fully healed she has mild erythema, granulation tissue and minimal wound separation present.  No drainage, tenderness or fluctuance.  She has no history of fevers and reports that she is otherwise feeling well today.  Discussed case with Dr. Jeraldine Loots.  As patient was to follow-up in MAU today per OB/GYN note for wound recheck and suture removal we will transfer her over to the MAU from our ED for further evaluation.  Discussed plan of care with patient she is agreeable to transfer over to MAU to be seen by OB/GYN.  Nursing staff coordinating transfer.  At this time there does not appear to be any evidence of an acute emergency medical condition and the patient appears stable for transfer to MAU.   Note: Portions of this report may have been transcribed using voice recognition software. Every effort was made to ensure accuracy; however, inadvertent computerized transcription errors may still be present. Final Clinical Impressions(s) / ED Diagnoses   Final diagnoses:  Encounter for postoperative wound check    ED Discharge Orders    None       Elizabeth Palau 10/22/18 7867    Gerhard Munch, MD 10/23/18 1424

## 2018-10-22 NOTE — ED Notes (Signed)
Per Erlene Quan PA, staples are not quite ready for removal and pt is supposed to follow up with ob in MAU today per the note. MAU called and this RN escorting pt over to MAU via wheelchair.

## 2018-10-22 NOTE — Discharge Instructions (Signed)
Keep your dressing clean, dry and intact. After showering, pat dry with a towel followed by drying with a hair dryer on low/cool setting until completely dry.

## 2018-10-27 ENCOUNTER — Ambulatory Visit: Payer: Medicaid Other | Admitting: Clinical

## 2018-10-27 ENCOUNTER — Other Ambulatory Visit: Payer: Self-pay

## 2018-10-27 DIAGNOSIS — Z5329 Procedure and treatment not carried out because of patient's decision for other reasons: Secondary | ICD-10-CM

## 2018-10-27 DIAGNOSIS — Z91199 Patient's noncompliance with other medical treatment and regimen due to unspecified reason: Secondary | ICD-10-CM

## 2018-10-27 NOTE — BH Specialist Note (Signed)
Pt did not arrive to video visit and did not answer the phone; Left HIPPA-compliant message to call back [name] from [facility] [employer] at [phone].   [place] via Telemedicine Video Visit  10/27/2018 Cheryl Fischer [id number]  Cheryl Fischer

## 2018-10-28 NOTE — Transfer of Care (Signed)
Immediate Anesthesia Transfer of Care Note  Patient: Cheryl Fischer  Procedure(s) Performed: CESAREAN SECTION (N/A Abdomen)  Patient Location: MICU  Anesthesia Type:General  Level of Consciousness: Patient remains intubated per anesthesia plan  Airway & Oxygen Therapy: Patient remains intubated per anesthesia plan  Post-op Assessment: Report given to RN and Post -op Vital signs reviewed and stable  Post vital signs: Reviewed and stable  Last Vitals:  Vitals Value Taken Time  BP 120/80 10/22/18 0945  Temp 36.4 C 10/22/18 0908  Pulse 78 10/22/18 0945  Resp 20 10/22/18 0945  SpO2 98 % 10/22/18 0945    Last Pain:  Vitals:   10/15/18 1117  TempSrc:   PainSc: 7       Patients Stated Pain Goal: 3 (30/86/57 8469)  Complications: No apparent anesthesia complications

## 2018-10-29 ENCOUNTER — Encounter (HOSPITAL_COMMUNITY): Payer: Self-pay | Admitting: Family Medicine

## 2018-10-29 NOTE — Anesthesia Postprocedure Evaluation (Signed)
Anesthesia Post Note  Patient: Cheryl Fischer  Procedure(s) Performed: CESAREAN SECTION (N/A Abdomen)     Patient location during evaluation: SICU Anesthesia Type: General Level of consciousness: sedated Pain management: pain level controlled Vital Signs Assessment: post-procedure vital signs reviewed and stable Respiratory status: patient remains intubated per anesthesia plan Cardiovascular status: stable Postop Assessment: no apparent nausea or vomiting Anesthetic complications: no    Last Vitals:  Vitals:   10/15/18 0500 10/15/18 0845  BP: 96/78 101/62  Pulse: 95 65  Resp:  18  Temp:  36.5 C  SpO2:  98%    Last Pain:  Vitals:   10/15/18 1117  TempSrc:   PainSc: 7    Pain Goal: Patients Stated Pain Goal: 3 (10/15/18 1117)                 Barnet Glasgow

## 2018-11-07 ENCOUNTER — Telehealth: Payer: Self-pay

## 2018-11-07 DIAGNOSIS — R569 Unspecified convulsions: Secondary | ICD-10-CM

## 2018-11-07 NOTE — Telephone Encounter (Signed)
Home health nurse "Tammy" called to let us know she spoke with the patient today and the patient reported she tapered off of the seizure medication, but she is still experiencing seizures. She reports that the seizures are mostly at night. The patient would like to know if she needs to be back on the medication.

## 2018-11-08 NOTE — Telephone Encounter (Signed)
Called patient and left her a VM stating that she would need to be seen by a neurologist for her symptoms, advised her that referral had been sent

## 2018-11-08 NOTE — Telephone Encounter (Signed)
If patient is still having seizures, she needs neurology referral.  I do not feel comfortable just prescribing an antiepileptic drug without further evaluation. Referral order placed.  Verita Schneiders, MD

## 2018-11-08 NOTE — Addendum Note (Signed)
Addended by: Verita Schneiders A on: 11/08/2018 01:33 PM   Modules accepted: Orders

## 2018-11-16 ENCOUNTER — Ambulatory Visit: Payer: Medicaid Other | Admitting: Obstetrics and Gynecology

## 2018-11-20 ENCOUNTER — Ambulatory Visit: Payer: Medicaid Other | Admitting: Diagnostic Neuroimaging

## 2018-11-20 ENCOUNTER — Encounter: Payer: Self-pay | Admitting: Diagnostic Neuroimaging

## 2018-11-26 NOTE — Progress Notes (Signed)
Entered chart for [employer] QI data 

## 2018-12-06 ENCOUNTER — Encounter (HOSPITAL_COMMUNITY): Payer: Self-pay | Admitting: Family Medicine

## 2018-12-08 ENCOUNTER — Telehealth: Payer: Self-pay | Admitting: Obstetrics

## 2019-05-18 ENCOUNTER — Encounter: Payer: Medicaid Other | Attending: Surgery | Admitting: Dietician

## 2019-10-12 ENCOUNTER — Telehealth: Payer: Medicaid Other | Admitting: Internal Medicine

## 2019-11-21 ENCOUNTER — Emergency Department (HOSPITAL_COMMUNITY)
Admission: EM | Admit: 2019-11-21 | Discharge: 2019-11-21 | Disposition: A | Discharge status: Home / Self Care | Payer: Medicaid Other | Attending: Emergency Medicine | Admitting: Emergency Medicine

## 2019-11-21 ENCOUNTER — Other Ambulatory Visit: Payer: Self-pay

## 2019-11-21 ENCOUNTER — Emergency Department (HOSPITAL_COMMUNITY)
Admission: EM | Admit: 2019-11-21 | Discharge: 2019-11-21 | Disposition: A | Discharge status: Home / Self Care | Payer: Medicaid Other | Source: Home / Self Care | Attending: Emergency Medicine | Admitting: Emergency Medicine

## 2019-11-21 ENCOUNTER — Encounter (HOSPITAL_COMMUNITY): Payer: Self-pay

## 2019-11-21 ENCOUNTER — Encounter (HOSPITAL_COMMUNITY): Payer: Self-pay | Admitting: Emergency Medicine

## 2019-11-21 DIAGNOSIS — Y9341 Activity, dancing: Secondary | ICD-10-CM | POA: Insufficient documentation

## 2019-11-21 DIAGNOSIS — S21219A Laceration without foreign body of unspecified back wall of thorax without penetration into thoracic cavity, initial encounter: Secondary | ICD-10-CM

## 2019-11-21 DIAGNOSIS — W010XXA Fall on same level from slipping, tripping and stumbling without subsequent striking against object, initial encounter: Secondary | ICD-10-CM | POA: Insufficient documentation

## 2019-11-21 DIAGNOSIS — Y9289 Other specified places as the place of occurrence of the external cause: Secondary | ICD-10-CM | POA: Insufficient documentation

## 2019-11-21 DIAGNOSIS — W19XXXA Unspecified fall, initial encounter: Secondary | ICD-10-CM | POA: Insufficient documentation

## 2019-11-21 DIAGNOSIS — Z5321 Procedure and treatment not carried out due to patient leaving prior to being seen by health care provider: Secondary | ICD-10-CM | POA: Insufficient documentation

## 2019-11-21 MED ORDER — LIDOCAINE HCL (PF) 1 % IJ SOLN
30.0000 mL | Freq: Once | INTRAMUSCULAR | Status: AC
  Administered 2019-11-21: 30 mL via INTRADERMAL

## 2019-11-21 MED ORDER — LIDOCAINE HCL (PF) 1 % IJ SOLN
5.0000 mL | Freq: Once | INTRAMUSCULAR | Status: DC
  Filled 2019-11-21: qty 5

## 2019-11-21 NOTE — ED Triage Notes (Signed)
Pt presents to ED BIB [facility]. Pt c/o fall and upper back pain. Pt reports that she had mechanical fal, no head trauma, no LOC. Pt had laceration to upper back. Bleeding controlled

## 2019-11-21 NOTE — Discharge Instructions (Addendum)
Your laceration was addressed with sutures.  I recommend that you avoid getting the wound wet for the first 24 hours.  After that I would like you to wash the wound and change the dressings twice a day for the next 7 days.    I need you to follow-up at your  PCP, urgent care, emergency department in 7 to 10 days for suture removal as well as wound check.  Come back to the emergency department if you develop a fever, chills, increasing pain, redness, swelling around your wound, chest pain, shortness of breath, abdominal pain, nausea, vomiting, diarrhea.

## 2019-11-21 NOTE — ED Provider Notes (Signed)
[facility] EMERGENCY DEPARTMENT Provider Note   CSN: [id number] Arrival date & time: 11/21/19  3016     History No chief complaint on file.   Cheryl Fischer is a 29 y.o. female.  HPI   Patient with significant medical history of anemia presents to the emergency department with chief complaint of fall and laceration on her back.  Patient states it is her birthday today and she was out dancing outside where she tripped and fell onto the ground.  There was broken glass scattered on the ground and she cut the upper part of her back..  She denies hitting her head, losing conscious, being on anticoags.  Patient states she has no pain at this time, she didn't even know that she had a cut on her back until her friend noticed it. she is not immunocompromise and her last tetanus shot was last year on 07/20. She denies headache, fever, chills, shortness of breath, chest pain, vomiting, nausea, vomiting, diarrhea.  Past Medical History:  Diagnosis Date  . Anemia   . Blood transfusion without reported diagnosis   . Vaginal Pap smear, abnormal     Patient Active Problem List   Diagnosis Date Noted  . Encounter for postoperative wound care 10/22/2018  . Status post cesarean section 10/13/2018  . Hemorrhagic shock (HCC) 22-Oct-2018  . Uterine rupture 2018/10/22  . Neonatal death 22-Oct-2018  . Acute blood loss anemia 10/22/2018  . Hypoxia   . Respiratory failure (HCC)   . Cardiac arrest (HCC)   . Gestational diabetes mellitus (GDM) 08/21/2018  . Obesity during pregnancy 08/17/2018  . H/O multiple gestation 05/08/2018  . Elevated blood pressure reading without diagnosis of hypertension 05/08/2018  . Supervision of other normal pregnancy, antepartum 04/24/2018  . History of cesarean delivery, currently pregnant 04/24/2018    Past Surgical History:  Procedure Laterality Date  . CESAREAN SECTION    . CESAREAN SECTION N/A Oct 22, 2018   Procedure: CESAREAN SECTION;  Surgeon:  Reva Bores, MD;  Location: [facility] LD ORS;  Service: Obstetrics;  Laterality: N/A;     OB History    Gravida  6   Para  6   Term  5   Preterm  1   AB      Living  6     SAB      TAB      Ectopic      Multiple  2   Live Births  6        Obstetric Comments  Twins        Family History  Problem Relation Age of Onset  . Cancer Paternal Grandmother     Social History   Tobacco Use  . Smoking status: Never Smoker  . Smokeless tobacco: Never Used  Vaping Use  . Vaping Use: Never used  Substance Use Topics  . Alcohol use: Never  . Drug use: Never    Home Medications Prior to Admission medications   Medication Sig Start Date End Date Taking? Authorizing Provider  ibuprofen (ADVIL) 200 MG tablet Take 200 mg by mouth every 6 (six) hours as needed.    [provider]  levETIRAcetam (KEPPRA) 250 MG tablet One tablet in the morning and two at night for 3 days. One tablet by mouth twice a day for 3 days and then one at bedtime for 3 days 10/15/18   Adam Phenix, MD  oxyCODONE (OXY IR/ROXICODONE) 5 MG immediate release tablet Take 1-2 tablets (5-10 mg  total) by mouth every 4 (four) hours as needed for severe pain. 10/15/18   Adam Phenix, MD  oxyCODONE-acetaminophen (PERCOCET/ROXICET) 5-325 MG tablet Take 1 tablet by mouth every 6 (six) hours as needed. 10/19/18   Constant, Peggy, MD  Prenat w/o A Vit-FeFum-FePo-FA (CONCEPT OB) 130-92.4-1 MG CAPS Take 1 capsule by mouth daily. 09/25/18   Allie Bossier, MD  sertraline (ZOLOFT) 50 MG tablet Take 1 tablet (50 mg total) by mouth daily. 10/19/18   Constant, Peggy, MD  traZODone (DESYREL) 50 MG tablet Take 1 tablet (50 mg total) by mouth at bedtime. 10/20/18   Constant, Peggy, MD  zolpidem (AMBIEN) 5 MG tablet Take 1 tablet (5 mg total) by mouth at bedtime as needed for sleep. Patient not taking: Reported on 10/19/2018 10/15/18   Adam Phenix, MD    Allergies    Penicillins  Review of Systems   Review of Systems    Constitutional: Negative for chills and fever.  HENT: Negative for congestion, tinnitus, trouble swallowing and voice change.   Eyes: Negative for visual disturbance.  Respiratory: Negative for cough and shortness of breath.   Cardiovascular: Negative for chest pain.  Gastrointestinal: Negative for abdominal pain, diarrhea, nausea and vomiting.  Genitourinary: Negative for dysuria, enuresis and flank pain.  Musculoskeletal: Negative for back pain.  Skin: Positive for wound. Negative for rash.       Laceration on the right upper part of her back.  Neurological: Negative for dizziness.  Hematological: Does not bruise/bleed easily.    Physical Exam Updated Vital Signs BP 132/78 (BP Location: Right Arm)   Pulse 80   Temp 98.1 F (36.7 C) (Oral)   Resp 18   SpO2 100%   Physical Exam Vitals and nursing note reviewed.  Constitutional:      General: She is not in acute distress.    Appearance: Normal appearance. She is not ill-appearing or diaphoretic.  HENT:     Head: Normocephalic and atraumatic.     Nose: No congestion or rhinorrhea.  Eyes:     General: No scleral icterus.       Right eye: No discharge.        Left eye: No discharge.     Conjunctiva/sclera: Conjunctivae normal.  Pulmonary:     Effort: Pulmonary effort is normal. No respiratory distress.     Breath sounds: Normal breath sounds. No wheezing.  Musculoskeletal:     Cervical back: Neck supple.     Right lower leg: No edema.     Left lower leg: No edema.  Skin:    General: Skin is warm and dry.     Coloration: Skin is not jaundiced or pale.     Comments: Full skin exam was performed, there was a large laceration noted on the left upper part of the thoracic back.  It measured 20 cm in length and was mostly superficial.  There is no surrounding erythema, no drainage, discharge, hemodynamically stable.  Neurological:     General: No focal deficit present.     Mental Status: She is alert and oriented to person,  place, and time.  Psychiatric:        Mood and Affect: Mood normal.       ED Results / Procedures / Treatments   Labs (all labs ordered are listed, but only abnormal results are displayed) Labs Reviewed - No data to display  EKG None  Radiology No results found.  Procedures Procedures (including critical care time)  Medications  Ordered in ED Medications  lidocaine (PF) (XYLOCAINE) 1 % injection 30 mL (30 mLs Intradermal Given by Other 11/21/19 1004)    ED Course  I have reviewed the triage vital signs and the nursing notes.  Pertinent labs & imaging results that were available during my care of the patient were reviewed by me and considered in my medical decision making (see chart for details).    MDM Rules/Calculators/A&P                          I have personally reviewed all imaging, labs and have interpreted them.  Patient presents with chief complaint of mechanical fall with laceration on her back.  She is alert, did not appear in acute distress, vital signs reassuring.  Due to extent of laceration will recommend suturing to close wound and help with healing.  Will defer tetanus shot as time as she got 1 last year.  Wound was closed by resident Lorella Nimrod, MD who provided uninterrupted sutures of her back laceration.  She tolerated procedure well.  I have low suspicion for CVA or intracranial head bleed as patient states she had a mechanical fall, denies hitting her head, losing conscious, headache, change in vision, paresthesias in the upper or lower extremities, weakness, no focal deficits noted on exam.  Low suspicion for overlying cellulitis as there is no erythema noted around the wound, unlikely infection would occur in less than 3 hours.  Low suspicion for ligament or tendon damage as patient had full range of motion 5 /5 strength in all extremities, wound was evaluated no signs of ligament or tendon damage noted.  I suspect patient had a mechanical fall which  resulted in a laceration on her back.  Wound was closed with sutures and will recommend follow-up in 7- 10 days for suture removal as well as wound check.  Will defer antibiotic treatment at this time as she is not immunocompromise, wound was extensively cleaned.  Patient's vital signs remained stable, no indication for also admission.  Patient was discussed with attending who agrees assessment plan.  Patient is given at home schedule strict return precautions.  Patient was she understood and agreed to plan Final Clinical Impression(s) / ED Diagnoses Final diagnoses:  Laceration of back, unspecified laterality, initial encounter    Rx / DC Orders ED Discharge Orders    None       Carroll Sage, PA-C 11/21/19 Halford Chessman, MD 11/26/19 [phone]

## 2019-11-21 NOTE — ED Provider Notes (Signed)
..  Laceration Repair  Date/Time: 11/21/2019 10:53 AM Performed by: Mirian Mo, MD Authorized by: Gerhard Munch, MD   Consent:    Consent obtained:  Verbal   Consent given by:  Patient   Risks discussed:  Infection and pain Anesthesia (see MAR for exact dosages):    Anesthesia method:  Local infiltration   Local anesthetic:  Lidocaine 1% w/o epi Laceration details:    Location:  Trunk   Trunk location:  Upper back   Length (cm):  20   Depth (mm):  10 Repair type:    Repair type:  Simple Exploration:    Wound exploration: entire depth of wound probed and visualized     Contaminated: no   Treatment:    Amount of cleaning:  Standard   Irrigation solution:  Sterile saline   Irrigation volume:  60 cc   Irrigation method:  Syringe   Visualized foreign bodies/material removed: yes   Skin repair:    Repair method:  Sutures   Suture size:  3-0   Suture material:  Prolene   Suture technique:  Running   Number of sutures:  20   .    Mirian Mo, MD 11/21/19 1057    Gerhard Munch, MD 11/21/19 1651

## 2019-11-21 NOTE — ED Notes (Signed)
Called x 3 with no answer.

## 2019-11-21 NOTE — ED Triage Notes (Signed)
Patient reports that she fell this am cutting back on glass. Laceration across thoracic back area with no bleeding. Saline dressing applied and patient denies loc and denies pain

## 2019-11-21 NOTE — ED Notes (Signed)
Patient Alert and oriented to baseline. Stable and ambulatory to baseline. Patient verbalized understanding of the discharge instructions.  Patient belongings were taken by the patient.   

## 2019-11-29 ENCOUNTER — Ambulatory Visit (HOSPITAL_COMMUNITY)
Admission: EM | Admit: 2019-11-29 | Discharge: 2019-11-29 | Disposition: A | Discharge status: Home / Self Care | Payer: Medicaid Other | Attending: Family Medicine | Admitting: Family Medicine

## 2019-11-29 ENCOUNTER — Other Ambulatory Visit: Payer: Self-pay

## 2019-11-29 ENCOUNTER — Encounter (HOSPITAL_COMMUNITY): Payer: Self-pay | Admitting: *Deleted

## 2019-11-29 ENCOUNTER — Ambulatory Visit (HOSPITAL_COMMUNITY)
Admission: EM | Admit: 2019-11-29 | Discharge: 2019-11-29 | Disposition: A | Discharge status: Home / Self Care | Payer: Medicaid Other

## 2019-11-29 DIAGNOSIS — Z4802 Encounter for removal of sutures: Secondary | ICD-10-CM | POA: Diagnosis present

## 2019-11-29 DIAGNOSIS — N898 Other specified noninflammatory disorders of vagina: Secondary | ICD-10-CM | POA: Diagnosis present

## 2019-11-29 LAB — POCT URINALYSIS DIPSTICK, ED / UC
Glucose, UA: NEGATIVE mg/dL
Hgb urine dipstick: NEGATIVE
Nitrite: NEGATIVE
Protein, ur: NEGATIVE mg/dL
Specific Gravity, Urine: 1.025 (ref 1.005–1.030)
Urobilinogen, UA: 1 mg/dL (ref 0.0–1.0)
pH: 6 (ref 5.0–8.0)

## 2019-11-29 LAB — POC URINE PREG, ED: Preg Test, Ur: NEGATIVE

## 2019-11-29 NOTE — ED Provider Notes (Signed)
[facility] CARE CENTER   [id number] 11/29/19 Arrival Time: 1806  ASSESSMENT & PLAN:  1. Vaginal discharge   2. Encounter for removal of sutures     Sutures removed by RN without complication. Wound has healed well.   Discharge Instructions     We have sent testing for sexually transmitted infections. We will notify you of any positive results once they are received. If required, we will prescribe any medications you might need.  Please refrain from all sexual activity for at least the next seven days.     Without s/s of PID. Declines empiric treatment.  Labs Reviewed  POCT URINALYSIS DIPSTICK, ED / UC - Abnormal; Notable for the following components:      Result Value   Bilirubin Urine SMALL (*)    Ketones, ur TRACE (*)    Leukocytes,Ua TRACE (*)    All other components within normal limits  URINE CULTURE  POC URINE PREG, ED  CERVICOVAGINAL ANCILLARY ONLY     Will notify of any positive results. Instructed to refrain from sexual activity for at least seven days.  Reviewed expectations re: course of current medical issues. Questions answered. Outlined signs and symptoms indicating need for more acute intervention. Patient verbalized understanding. After Visit Summary given.   SUBJECTIVE:  Cheryl Fischer is a 29 y.o. female who presents for suture removal; upper back; placed in ED on 11/21/19. No concerns over wound.  Also with complaint of vaginal discharge. Onset gradual. First noticed a week ago. Describes discharge as thick and opaque/white; without odor. No specific aggravating or alleviating factors reported. Denies: urinary frequency, dysuria and gross hematuria. Afebrile. No abdominal or pelvic pain. Normal PO intake wihout n/v. No genital rashes or lesions. Reports that she is sexually active with single female partner. OTC treatment: none.   Patient's last menstrual period was 11/16/2019.   OBJECTIVE:  Vitals:   11/29/19 1858 11/29/19 1901  BP: 119/82     Pulse: 67   Resp: 16   Temp: 98.5 F (36.9 C)   TempSrc: Oral   SpO2: 99%   Weight:  104.3 kg  Height:  5\' 2"  (1.575 m)     General appearance: alert, cooperative, appears stated age and no distress Lungs: unlabored respirations; speaks full sentences without difficulty Back: no CVA tenderness; FROM at waist Abdomen: soft, non-tender GU: deferred Skin: warm and dry; healed laceration of upper back; sutures intact Psychological: alert and cooperative; normal mood and affect.  Results for orders placed or performed during the hospital encounter of 11/29/19  POCT Urinalysis Dipstick (ED/UC)  Result Value Ref Range   Glucose, UA NEGATIVE NEGATIVE mg/dL   Bilirubin Urine SMALL (A) NEGATIVE   Ketones, ur TRACE (A) NEGATIVE mg/dL   Specific Gravity, Urine 1.025 1.005 - 1.030   Hgb urine dipstick NEGATIVE NEGATIVE   pH 6.0 5.0 - 8.0   Protein, ur NEGATIVE NEGATIVE mg/dL   Urobilinogen, UA 1.0 0.0 - 1.0 mg/dL   Nitrite NEGATIVE NEGATIVE   Leukocytes,Ua TRACE (A) NEGATIVE  POC urine preg, ED (not at [facility])  Result Value Ref Range   Preg Test, Ur NEGATIVE NEGATIVE    Labs Reviewed  POCT URINALYSIS DIPSTICK, ED / UC - Abnormal; Notable for the following components:      Result Value   Bilirubin Urine SMALL (*)    Ketones, ur TRACE (*)    Leukocytes,Ua TRACE (*)    All other components within normal limits  URINE CULTURE  POC URINE PREG, ED  CERVICOVAGINAL  ANCILLARY ONLY    Allergies  Allergen Reactions   Penicillins Hives, Shortness Of Breath and Swelling    Did it involve swelling of the face/tongue/throat, SOB, or low BP? Yes Did it involve sudden or severe rash/hives, skin peeling, or any reaction on the inside of your mouth or nose? Yes Did you need to seek medical attention at a hospital or doctor's office? Unknown When did it last happen? Childhood reaction If all above answers are NO, may proceed with cephalosporin use.     Past Medical History:   Diagnosis Date   Anemia    Blood transfusion without reported diagnosis    Vaginal Pap smear, abnormal    Family History  Problem Relation Age of Onset   Cancer Paternal Grandmother    Social History   Socioeconomic History   Marital status: Significant Other    Spouse name: Not on file   Number of children: 7   Years of education: Not on file   Highest education level: Not on file  Occupational History   Not on file  Tobacco Use   Smoking status: Never Smoker   Smokeless tobacco: Never Used  Vaping Use   Vaping Use: Never used  Substance and Sexual Activity   Alcohol use: Never   Drug use: Never   Sexual activity: Yes    Birth control/protection: None    Comment: last sex 17 Aug 20  Other Topics Concern   Not on file  Social History Narrative   Not on file   Social Determinants of Health   Financial Resource Strain:    Difficulty of Paying Living Expenses: Not on file  Food Insecurity:    Worried About Programme researcher, broadcasting/film/video in the Last Year: Not on file   [employer] of Food in the Last Year: Not on file  Transportation Needs:    Lack of Transportation (Medical): Not on file   Lack of Transportation (Non-Medical): Not on file  Physical Activity:    Days of Exercise per Week: Not on file   Minutes of Exercise per Session: Not on file  Stress:    Feeling of Stress : Not on file  Social Connections:    Frequency of Communication with Friends and Family: Not on file   Frequency of Social Gatherings with Friends and Family: Not on file   Attends Religious Services: Not on file   Active Member of Clubs or Organizations: Not on file   Attends Banker Meetings: Not on file   Marital Status: Not on file  Intimate Partner Violence:    Fear of Current or Ex-Partner: Not on file   Emotionally Abused: Not on file   Physically Abused: Not on file   Sexually Abused: Not on file          Murphy, MD 11/29/19  1929

## 2019-11-29 NOTE — Discharge Instructions (Addendum)
We have sent testing for sexually transmitted infections. We will notify you of any positive results once they are received. If required, we will prescribe any medications you might need.  Please refrain from all sexual activity for at least the next seven days.  

## 2019-11-29 NOTE — ED Triage Notes (Signed)
Pt reports having a VAG DC for one week . DC is white in color. Pt also needs sutures removed located on her back.

## 2019-11-29 NOTE — ED Notes (Signed)
Suture removal to mid back

## 2019-11-30 ENCOUNTER — Telehealth (HOSPITAL_COMMUNITY): Payer: Self-pay | Admitting: Emergency Medicine

## 2019-11-30 LAB — CERVICOVAGINAL ANCILLARY ONLY
Bacterial Vaginitis (gardnerella): POSITIVE — AB
Candida Glabrata: NEGATIVE
Candida Vaginitis: POSITIVE — AB
Chlamydia: NEGATIVE
Comment: NEGATIVE
Comment: NEGATIVE
Comment: NEGATIVE
Comment: NEGATIVE
Comment: NEGATIVE
Comment: NORMAL
Neisseria Gonorrhea: NEGATIVE
Trichomonas: NEGATIVE

## 2019-11-30 MED ORDER — FLUCONAZOLE 150 MG PO TABS: 150.0000 mg | ORAL_TABLET | Freq: Once | ORAL | 0 refills | Status: AC

## 2019-11-30 MED ORDER — METRONIDAZOLE 500 MG PO TABS: 500.0000 mg | ORAL_TABLET | Freq: Two times a day (BID) | ORAL | 0 refills | Status: DC

## 2019-12-01 LAB — URINE CULTURE: Culture: >=100000 — AB

## 2019-12-03 NOTE — Telephone Encounter (Signed)
Had to call in to give a verbal on the pharmacy voicemail as prescription did not go through electronically.

## 2020-07-23 ENCOUNTER — Other Ambulatory Visit: Payer: Self-pay

## 2020-07-23 ENCOUNTER — Emergency Department (HOSPITAL_COMMUNITY)
Admission: EM | Admit: 2020-07-23 | Discharge: 2020-07-23 | Disposition: A | Discharge status: Home / Self Care | Payer: Medicaid Other | Attending: Emergency Medicine | Admitting: Emergency Medicine

## 2020-07-23 ENCOUNTER — Emergency Department (HOSPITAL_COMMUNITY): Payer: Medicaid Other

## 2020-07-23 DIAGNOSIS — K429 Umbilical hernia without obstruction or gangrene: Secondary | ICD-10-CM | POA: Diagnosis not present

## 2020-07-23 DIAGNOSIS — M79671 Pain in right foot: Secondary | ICD-10-CM | POA: Diagnosis not present

## 2020-07-23 DIAGNOSIS — R1110 Vomiting, unspecified: Secondary | ICD-10-CM | POA: Diagnosis not present

## 2020-07-23 DIAGNOSIS — M79672 Pain in left foot: Secondary | ICD-10-CM

## 2020-07-23 LAB — COMPREHENSIVE METABOLIC PANEL
ALT: 25 U/L (ref 0–44)
AST: 24 U/L (ref 15–41)
Albumin: 3.2 g/dL — ABNORMAL LOW (ref 3.5–5.0)
Alkaline Phosphatase: 49 U/L (ref 38–126)
Anion gap: 8 (ref 5–15)
BUN: 12 mg/dL (ref 6–20)
CO2: 24 mmol/L (ref 22–32)
Calcium: 8.5 mg/dL — ABNORMAL LOW (ref 8.9–10.3)
Chloride: 107 mmol/L (ref 98–111)
Creatinine, Ser: 0.82 mg/dL (ref 0.44–1.00)
GFR, Estimated: >60 mL/min (ref >60)
Glucose, Bld: 106 mg/dL — ABNORMAL HIGH (ref 70–99)
Potassium: 3.9 mmol/L (ref 3.5–5.1)
Sodium: 139 mmol/L (ref 135–145)
Total Bilirubin: 0.5 mg/dL (ref 0.3–1.2)
Total Protein: 5.9 g/dL — ABNORMAL LOW (ref 6.5–8.1)

## 2020-07-23 LAB — CBC WITH DIFFERENTIAL/PLATELET
Abs Immature Granulocytes: 0.02 10*3/uL (ref 0.00–0.07)
Basophils Absolute: 0 10*3/uL (ref 0.0–0.1)
Basophils Relative: 0 %
Eosinophils Absolute: 0.4 10*3/uL (ref 0.0–0.5)
Eosinophils Relative: 5 %
HCT: 43.1 % (ref 36.0–46.0)
Hemoglobin: 14.2 g/dL (ref 12.0–15.0)
Immature Granulocytes: 0 %
Lymphocytes Relative: 26 %
Lymphs Abs: 2.2 10*3/uL (ref 0.7–4.0)
MCH: 31.3 pg (ref 26.0–34.0)
MCHC: 32.9 g/dL (ref 30.0–36.0)
MCV: 95.1 fL (ref 80.0–100.0)
Monocytes Absolute: 0.5 10*3/uL (ref 0.1–1.0)
Monocytes Relative: 5 %
Neutro Abs: 5.3 10*3/uL (ref 1.7–7.7)
Neutrophils Relative %: 64 %
Platelets: 276 10*3/uL (ref 150–400)
RBC: 4.53 MIL/uL (ref 3.87–5.11)
RDW: 12.4 % (ref 11.5–15.5)
WBC: 8.4 10*3/uL (ref 4.0–10.5)
nRBC: 0 % (ref 0.0–0.2)

## 2020-07-23 LAB — I-STAT BETA HCG BLOOD, ED (MC, WL, AP ONLY): I-stat hCG, quantitative: <5 m[IU]/mL (ref <5)

## 2020-07-23 LAB — LIPASE, BLOOD: Lipase: 28 U/L (ref 11–51)

## 2020-07-23 MED ORDER — NAPROXEN 500 MG PO TABS: 500.0000 mg | ORAL_TABLET | Freq: Two times a day (BID) | ORAL | 0 refills | Status: DC

## 2020-07-23 NOTE — ED Triage Notes (Signed)
Patient complains of left foot pain for several days with ambulation, denies trauma. 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

## 2020-07-23 NOTE — ED Provider Notes (Signed)
[facility] EMERGENCY DEPARTMENT Provider Note   CSN: [id number] Arrival date & time: 07/23/20  1115     History No chief complaint on file.   Cheryl Fischer is a 30 y.o. female with a past medical history of anemia presenting to the ED with multiple complaints. 1.  Complaints of pain to her umbilical hernia for the past week.  She has had intermittent episodes of vomiting.  Last bowel movement was today.  No changes no flatulence.  She has had this hernia present for about 4 years but noticed that the pain has worsened.  In the past she has seen general surgery and was told that she needed to lose weight and stop smoking before they could operate.  States that she has lost weight but there is still no plan to operate on this hernia.  Denies any fevers, shortness of breath, chest pain. 2.  Complaining of left foot pain.  This has been going on for a few weeks.  Pain is located at the top of her foot.  Has not tried medications to help with her symptoms.  Denies any numbness, injury or trauma, prior fracture, dislocations or procedures in the area or joint pain  HPI     Past Medical History:  Diagnosis Date  . Anemia   . Blood transfusion without reported diagnosis   . Vaginal Pap smear, abnormal     Patient Active Problem List   Diagnosis Date Noted  . Encounter for postoperative wound care 10/22/2018  . Status post cesarean section 10/13/2018  . Hemorrhagic shock (HCC) 2018/11/09  . Uterine rupture 2018/11/09  . Neonatal death 09-Nov-2018  . Acute blood loss anemia Nov 09, 2018  . Hypoxia   . Respiratory failure (HCC)   . Cardiac arrest (HCC)   . Gestational diabetes mellitus (GDM) 08/21/2018  . Obesity during pregnancy 08/17/2018  . H/O multiple gestation 05/08/2018  . Elevated blood pressure reading without diagnosis of hypertension 05/08/2018  . Supervision of other normal pregnancy, antepartum 04/24/2018  . History of cesarean delivery, currently pregnant  04/24/2018    Past Surgical History:  Procedure Laterality Date  . CESAREAN SECTION    . CESAREAN SECTION N/A 11-09-2018   Procedure: CESAREAN SECTION;  Surgeon: Reva Bores, MD;  Location: [facility] LD [facility]S;  Service: Obstetrics;  Laterality: N/A;     OB History    Gravida  6   Para  6   Term  5   Preterm  1   AB      Living  6     SAB      IAB      Ectopic      Multiple  2   Live Births  6        Obstetric Comments  Twins        Family History  Problem Relation Age of Onset  . Cancer Paternal Grandmother     Social History   Tobacco Use  . Smoking status: Never Smoker  . Smokeless tobacco: Never Used  Vaping Use  . Vaping Use: Never used  Substance Use Topics  . Alcohol use: Never  . Drug use: Never    Home Medications Prior to Admission medications   Medication Sig Start Date End Date Taking? Authorizing Provider  naproxen (NAPROSYN) 500 MG tablet Take 1 tablet (500 mg total) by mouth 2 (two) times daily. 07/23/20  Yes Keeanna Villafranca, PA-C  metroNIDAZOLE (FLAGYL) 500 MG tablet Take 1 tablet (500 mg total)  by mouth 2 (two) times daily. 11/30/19   Merrilee JanskyLamptey, Philip O, MD  levETIRAcetam (KEPPRA) 250 MG tablet One tablet in the morning and two at night for 3 days. One tablet by mouth twice a day for 3 days and then one at bedtime for 3 days 10/15/18 11/29/19  Adam PhenixArnold, James G, MD  sertraline (ZOLOFT) 50 MG tablet Take 1 tablet (50 mg total) by mouth daily. 10/19/18 11/29/19  Constant, Peggy, MD  traZODone (DESYREL) 50 MG tablet Take 1 tablet (50 mg total) by mouth at bedtime. 10/20/18 11/29/19  Constant, Peggy, MD  zolpidem (AMBIEN) 5 MG tablet Take 1 tablet (5 mg total) by mouth at bedtime as needed for sleep. Patient not taking: Reported on 10/19/2018 10/15/18 11/29/19  Adam PhenixArnold, James G, MD    Allergies    Penicillins  Review of Systems   Review of Systems  Constitutional: Negative for appetite change, chills and fever.  HENT: Negative for ear pain, rhinorrhea,  sneezing and sore throat.   Eyes: Negative for photophobia and visual disturbance.  Respiratory: Negative for cough, chest tightness, shortness of breath and wheezing.   Cardiovascular: Negative for chest pain and palpitations.  Gastrointestinal: Positive for abdominal pain. Negative for blood in stool, constipation, diarrhea, nausea and vomiting.  Genitourinary: Negative for dysuria, hematuria and urgency.  Musculoskeletal: Positive for myalgias.  Skin: Negative for rash.  Neurological: Negative for dizziness, weakness and light-headedness.    Physical Exam Updated Vital Signs BP 121/74 (BP Location: Left Arm)   Pulse (!) 50   Temp 98 F (36.7 C) (Oral)   Resp 20   Ht 5\' 2"  (1.575 m)   Wt 99.8 kg   LMP 07/15/2020   SpO2 99%   BMI 40.24 kg/m   Physical Exam Vitals and nursing note reviewed.  Constitutional:      General: She is not in acute distress.    Appearance: She is well-developed.  HENT:     Head: Normocephalic and atraumatic.     Nose: Nose normal.  Eyes:     General: No scleral icterus.       Left eye: No discharge.     Conjunctiva/sclera: Conjunctivae normal.  Cardiovascular:     Rate and Rhythm: Normal rate and regular rhythm.     Heart sounds: Normal heart sounds. No murmur heard. No friction rub. No gallop.   Pulmonary:     Effort: Pulmonary effort is normal. No respiratory distress.     Breath sounds: Normal breath sounds.  Abdominal:     General: Bowel sounds are normal. There is no distension.     Palpations: Abdomen is soft.     Tenderness: There is no abdominal tenderness. There is no guarding.     Comments: Umbilical hernia noted. Not tender to touch. Able to reduce. No overlying skin changes.  Musculoskeletal:        General: Normal range of motion.     Cervical back: Normal range of motion and neck supple.     Comments: Normal range of motion of digits and ankle of left foot.  2+ DP pulse palpated.  Normal sensation to light touch.  No ankle  tenderness or deformities.  No joint swelling, erythema or warmth.  Skin:    General: Skin is warm and dry.     Findings: No rash.  Neurological:     Mental Status: She is alert.     Motor: No abnormal muscle tone.     Coordination: Coordination normal.  ED Results / Procedures / Treatments   Labs (all labs ordered are listed, but only abnormal results are displayed) Labs Reviewed  COMPREHENSIVE METABOLIC PANEL - Abnormal; Notable for the following components:      Result Value   Glucose, Bld 106 (*)    Calcium 8.5 (*)    Total Protein 5.9 (*)    Albumin 3.2 (*)    All other components within normal limits  CBC WITH DIFFERENTIAL/PLATELET  LIPASE, BLOOD  I-STAT BETA HCG BLOOD, ED ([facility], [facility], [facility] ONLY)    EKG None  Radiology DG Foot Complete Left  Result Date: 07/23/2020 CLINICAL DATA:  Left foot pain. EXAM: LEFT FOOT - COMPLETE 3+ VIEW COMPARISON:  No recent. FINDINGS: No acute bony or joint abnormality identified. No evidence of fracture or dislocation. IMPRESSION: No acute abnormality. Electronically Signed   By: Maisie Fus  Register   On: 07/23/2020 12:57    Procedures Procedures   Medications Ordered in ED Medications - No data to display  ED Course  I have reviewed the triage vital signs and the nursing notes.  Pertinent labs & imaging results that were available during my care of the patient were reviewed by me and considered in my medical decision making (see chart for details).    MDM Rules/Calculators/A&P                          30 year old female presenting to the ED for multiple complaints. 1.  Umbilical hernia.  Has been present for 4 years.  Pain worsens when looking up.  No exquisite tenderness noted on exam.  This is reproducible here.  No vomiting or changes to bowel movements today.  She is afebrile.  Abdomen is otherwise soft and nontender as well.  Lab work including CBC, CMP unremarkable.  hCG is negative.  We will have her follow-up with general  surgery as this does not warrant emergent treatment at this time. 2.  Reporting pain to the top of her left foot for several weeks.  No injury or trauma.  On exam normal sensation and strength noted.  Normal range of motion of joints without overlying skin changes.  X-rays negative.  Doubt infectious or vascular cause of symptoms.  Will treat with NSAIDs and have her follow-up with PCP.  Return precautions given peer    Patient is hemodynamically stable, in NAD, and able to ambulate in the ED. Evaluation does not show pathology that would require ongoing emergent intervention or inpatient treatment. I explained the diagnosis to the patient. Pain has been managed and has no complaints prior to discharge. Patient is comfortable with above plan and is stable for discharge at this time. All questions were answered prior to disposition. Strict return precautions for returning to the ED were discussed. Encouraged follow up with PCP.   An After Visit Summary was printed and given to the patient.   Portions of this note were generated with Scientist, clinical (histocompatibility and immunogenetics). Dictation errors may occur despite best attempts at proofreading.  Final Clinical Impression(s) / ED Diagnoses Final diagnoses:  Umbilical hernia without obstruction and without gangrene  Left foot pain    Rx / DC Orders ED Discharge Orders         Ordered    naproxen (NAPROSYN) 500 MG tablet  2 times daily        07/23/20 2200           Dietrich Pates, PA-C 07/23/20 2200    Little,  Ambrose Finland, MD 07/23/20 2352

## 2020-07-23 NOTE — ED Provider Notes (Signed)
Emergency Medicine Provider Triage Evaluation Note  Cheryl Fischer , a 30 y.o. female  was evaluated in triage.  Pt complains of persistent umbilical hernia pain for the past 4 years. Has been seen by general surgery however was told she needed to lose weight and other things that needed to be done before talking about surgical repair. Pt states she never followed back up however the pain seems worse than normal now. Has been taking Ibuprofen and Tylenol without relief. Denies skin changes to the hernia. Is still passing gas. Still having regular BMs.   Also complains of L foot pain, anterior. No trauma. No fevers or chills.   Review of Systems  Positive: + umbilical hernia pain, left foot pain Negative: - skin changes, fevers, chills, nausea, vomiting, constipation, obstipation  Physical Exam  BP 123/80 (BP Location: Left Arm)   Pulse 67   Temp 98.2 F (36.8 C) (Oral)   Resp 16   SpO2 97%  Gen:   Awake, no distress   Resp:  Normal effort  MSK:   Moves extremities without difficulty  Other:  Umbilical hernia noted with mild TTP; no overlying skin changes; unable to be easily reduced in chair in triage. Active BS.  + TTP to anterior aspect of left foot; small amount of erythema noted to dorsal aspect. 2+ DP pulse. No increased warmth.   Medical Decision Making  Medically screening exam initiated at 11:49 AM.  Appropriate orders placed.  Cheryl Fischer was informed that the remainder of the evaluation will be completed by another provider, this initial triage assessment does not replace that evaluation, and the importance of remaining in the ED until their evaluation is complete.     Tanda Rockers, PA-C 07/23/20 1154    Cathren Laine, MD 07/25/20 [phone]

## 2020-07-23 NOTE — ED Notes (Signed)
Remains not in room

## 2020-07-23 NOTE — ED Notes (Signed)
Not in bed at this time 

## 2020-07-23 NOTE — Discharge Instructions (Addendum)
You will need to follow-up with the general surgeon listed below regarding your hernia. Naproxen will help with your foot pain.  Use ice as needed as well. Follow-up with your primary care provider. Return to the ER if you start to experience worsening pain, increased vomiting, fever.

## 2020-08-08 ENCOUNTER — Encounter (HOSPITAL_COMMUNITY): Payer: Self-pay

## 2020-08-08 ENCOUNTER — Ambulatory Visit (HOSPITAL_COMMUNITY)
Admission: EM | Admit: 2020-08-08 | Discharge: 2020-08-08 | Disposition: A | Discharge status: Home / Self Care | Payer: Medicaid Other | Attending: Urgent Care | Admitting: Urgent Care

## 2020-08-08 DIAGNOSIS — N912 Amenorrhea, unspecified: Secondary | ICD-10-CM | POA: Diagnosis not present

## 2020-08-08 DIAGNOSIS — R197 Diarrhea, unspecified: Secondary | ICD-10-CM | POA: Diagnosis present

## 2020-08-08 DIAGNOSIS — R112 Nausea with vomiting, unspecified: Secondary | ICD-10-CM | POA: Diagnosis present

## 2020-08-08 DIAGNOSIS — K529 Noninfective gastroenteritis and colitis, unspecified: Secondary | ICD-10-CM

## 2020-08-08 DIAGNOSIS — Z3202 Encounter for pregnancy test, result negative: Secondary | ICD-10-CM | POA: Insufficient documentation

## 2020-08-08 LAB — POCT URINALYSIS DIPSTICK, ED / UC
Bilirubin Urine: NEGATIVE
Glucose, UA: NEGATIVE mg/dL
Hgb urine dipstick: NEGATIVE
Ketones, ur: NEGATIVE mg/dL
Leukocytes,Ua: NEGATIVE
Nitrite: NEGATIVE
Protein, ur: NEGATIVE mg/dL
Specific Gravity, Urine: 1.02 (ref 1.005–1.030)
Urobilinogen, UA: 0.2 mg/dL (ref 0.0–1.0)
pH: 7 (ref 5.0–8.0)

## 2020-08-08 LAB — POC URINE PREG, ED: Preg Test, Ur: NEGATIVE

## 2020-08-08 MED ORDER — ONDANSETRON 8 MG PO TBDP: 8.0000 mg | ORAL_TABLET | Freq: Three times a day (TID) | ORAL | 0 refills | Status: DC | PRN

## 2020-08-08 MED ORDER — LOPERAMIDE HCL 2 MG PO CAPS: 2.0000 mg | ORAL_CAPSULE | Freq: Two times a day (BID) | ORAL | 0 refills | Status: DC | PRN

## 2020-08-08 NOTE — ED Provider Notes (Signed)
[facility]   MRN: [id number] DOB: 03/10/1990  Subjective:   Cheryl Fischer is a 30 y.o. female presenting for 1 week history of persistent vaginal discharge and slight irritation, amenorrhea.  LMP was at the beginning of May.  She is normally very regular.  Denies history of gynecologic problems.  Have a gynecologist.  Denies fever, nausea, vomiting, urinary frequency, dysuria, genital rash.  She is sexually active, does not use condoms for protection.  No OCP.  She is also had some nausea with vomiting, diarrhea for the past few days.  No recent long distance travel, antibiotic use, hospitalizations.  No history of GI issues.  No current facility-administered medications for this encounter.  Current Outpatient Medications:    metroNIDAZOLE (FLAGYL) 500 MG tablet, Take 1 tablet (500 mg total) by mouth 2 (two) times daily., Disp: 14 tablet, Rfl: 0   naproxen (NAPROSYN) 500 MG tablet, Take 1 tablet (500 mg total) by mouth 2 (two) times daily., Disp: 30 tablet, Rfl: 0   Allergies  Allergen Reactions   Penicillins Hives, Shortness Of Breath and Swelling    Did it involve swelling of the face/tongue/throat, SOB, or low BP? Yes Did it involve sudden or severe rash/hives, skin peeling, or any reaction on the inside of your mouth or nose? Yes Did you need to seek medical attention at a hospital or doctor's office? Unknown When did it last happen? Childhood reaction      If all above answers are "NO", may proceed with cephalosporin use.     Past Medical History:  Diagnosis Date   Anemia    Blood transfusion without reported diagnosis    Vaginal Pap smear, abnormal      Past Surgical History:  Procedure Laterality Date   CESAREAN SECTION     CESAREAN SECTION N/A 10/12/2018   Procedure: CESAREAN SECTION;  Surgeon: Reva Bores, MD;  Location: [facility] LD ORS;  Service: Obstetrics;  Laterality: N/A;    Family History  Problem Relation Age of Onset   Cancer Paternal  Grandmother     Social History   Tobacco Use   Smoking status: Never   Smokeless tobacco: Never  Vaping Use   Vaping Use: Never used  Substance Use Topics   Alcohol use: Never   Drug use: Never    ROS   Objective:   Vitals: BP 128/78 (BP Location: Right Arm)   Pulse 60   Temp 97.6 F (36.4 C) (Oral)   Resp 18   LMP 06/15/2020 (Approximate)   SpO2 98%   Physical Exam Constitutional:      General: She is not in acute distress.    Appearance: Normal appearance. She is well-developed and normal weight. She is not ill-appearing, toxic-appearing or diaphoretic.  HENT:     Head: Normocephalic and atraumatic.     Right Ear: External ear normal.     Left Ear: External ear normal.     Nose: Nose normal.     Mouth/Throat:     Mouth: Mucous membranes are moist.     Pharynx: Oropharynx is clear.  Eyes:     General: No scleral icterus.    Extraocular Movements: Extraocular movements intact.     Pupils: Pupils are equal, round, and reactive to light.  Cardiovascular:     Rate and Rhythm: Normal rate and regular rhythm.     Heart sounds: Normal heart sounds. No murmur heard.   No friction rub. No gallop.  Pulmonary:  Effort: Pulmonary effort is normal. No respiratory distress.     Breath sounds: Normal breath sounds. No stridor. No wheezing, rhonchi or rales.  Abdominal:     General: Bowel sounds are normal. There is no distension.     Palpations: Abdomen is soft. There is no mass.     Tenderness: There is no abdominal tenderness. There is no right CVA tenderness, left CVA tenderness, guarding or rebound.  Skin:    General: Skin is warm and dry.     Coloration: Skin is not pale.     Findings: No rash.  Neurological:     General: No focal deficit present.     Mental Status: She is alert and oriented to person, place, and time.  Psychiatric:        Mood and Affect: Mood normal.        Behavior: Behavior normal.        Thought Content: Thought content normal.         Judgment: Judgment normal.    Results for orders placed or performed during the hospital encounter of 08/08/20 (from the past 24 hour(s))  POC Urinalysis dipstick     Status: None   Collection Time: 08/08/20 12:36 PM  Result Value Ref Range   Glucose, UA NEGATIVE NEGATIVE mg/dL   Bilirubin Urine NEGATIVE NEGATIVE   Ketones, ur NEGATIVE NEGATIVE mg/dL   Specific Gravity, Urine 1.020 1.005 - 1.030   Hgb urine dipstick NEGATIVE NEGATIVE   pH 7.0 5.0 - 8.0   Protein, ur NEGATIVE NEGATIVE mg/dL   Urobilinogen, UA 0.2 0.0 - 1.0 mg/dL   Nitrite NEGATIVE NEGATIVE   Leukocytes,Ua NEGATIVE NEGATIVE  POC urine pregnancy     Status: None   Collection Time: 08/08/20 12:38 PM  Result Value Ref Range   Preg Test, Ur NEGATIVE NEGATIVE    Assessment and Plan :   PDMP not reviewed this encounter.  1. Gastroenteritis   2. Negative pregnancy test   3. Amenorrhea   4. Nausea vomiting and diarrhea     Will manage for suspected viral gastroenteritis with supportive care.  Recommended patient hydrate well, eat light meals and maintain electrolytes.  Will use Zofran and Imodium for nausea, vomiting and diarrhea.  STI testing pending, given her undifferentiated amenorrhea recommended follow-up and establishing care with a gynecologist.  [facility]  Counseled patient on potential for adverse effects with medications prescribed/recommended today, ER and return-to-clinic precautions discussed, patient verbalized understanding.    Wallis Bamberg, PA-C 08/08/20 1255

## 2020-08-08 NOTE — Discharge Instructions (Addendum)

## 2020-08-08 NOTE — ED Triage Notes (Signed)
Pt in with c/o vaginal discharge, back pain and vaginal irritation x 1 week  Pt also c/o vomiting and late menstrual cycle   Requesting pregnancy test

## 2020-08-11 LAB — CERVICOVAGINAL ANCILLARY ONLY
Bacterial Vaginitis (gardnerella): POSITIVE — AB
Candida Glabrata: NEGATIVE
Candida Vaginitis: NEGATIVE
Chlamydia: NEGATIVE
Comment: NEGATIVE
Comment: NEGATIVE
Comment: NEGATIVE
Comment: NEGATIVE
Comment: NEGATIVE
Comment: NORMAL
Neisseria Gonorrhea: NEGATIVE
Trichomonas: NEGATIVE

## 2020-08-12 ENCOUNTER — Telehealth (HOSPITAL_COMMUNITY): Payer: Self-pay | Admitting: Emergency Medicine

## 2020-08-12 MED ORDER — METRONIDAZOLE 500 MG PO TABS: 500.0000 mg | ORAL_TABLET | Freq: Two times a day (BID) | ORAL | 0 refills | Status: DC

## 2020-10-24 ENCOUNTER — Inpatient Hospital Stay (HOSPITAL_COMMUNITY)
Admission: AD | Admit: 2020-10-24 | Discharge: 2020-10-24 | Disposition: A | Discharge status: Home / Self Care | Payer: Medicaid Other | Attending: Obstetrics and Gynecology | Admitting: Obstetrics and Gynecology

## 2020-10-24 ENCOUNTER — Other Ambulatory Visit: Payer: Self-pay

## 2020-10-24 DIAGNOSIS — Z3200 Encounter for pregnancy test, result unknown: Secondary | ICD-10-CM | POA: Diagnosis not present

## 2020-10-24 DIAGNOSIS — Z049 Encounter for examination and observation for unspecified reason: Secondary | ICD-10-CM | POA: Diagnosis present

## 2020-10-24 NOTE — MAU Provider Note (Signed)
Event Date/Time   First Provider Initiated Contact with Patient 10/24/20 1109      S [name] is a 30 y.o. [phone] patient who presents to MAU today with complaint of none. Patient states she was sent from Urgent Care because they told her she could come to MAU for a pregnancy confirmation test, letter and an [place] to show how far along she is. Denies pain, bleeding, nausea, vomiting. Patient had a positive UPT at home.  O BP 118/66 (BP Location: Right Arm)   Pulse 63   Temp 98 F (36.7 C) (Oral)   Resp 18   Ht 5\' 2"  (1.575 m)   Wt 96.1 kg   LMP 09/10/2020   SpO2 100%   BMI 38.74 kg/m   Patient Vitals for the past 24 hrs:  BP Temp Temp src Pulse Resp SpO2 Height Weight  10/24/20 1102 118/66 98 F (36.7 C) Oral 63 18 100 % 5\' 2"  (1.575 m) 96.1 kg   Physical Exam Vitals and nursing note reviewed.  Constitutional:      Appearance: Normal appearance.  HENT:     Head: Normocephalic and atraumatic.  Pulmonary:     Effort: Pulmonary effort is normal.  Neurological:     Mental Status: She is alert and oriented to person, place, and time.  Psychiatric:        Mood and Affect: Mood normal.        Behavior: Behavior normal.        Thought Content: Thought content normal.        Judgment: Judgment normal.   A Medical screening exam complete  P Discharge from MAU in stable condition List of options for follow-up given, advised can be seen at a [facility] office before noon today for walk-in pregnancy test/verification, but any dating would need to be scheduled with OB of choice Warning signs for worsening condition that would warrant emergency follow-up discussed Patient may return to MAU as needed   [name], [facility], NP 10/24/2020 11:15 AM

## 2020-10-24 NOTE — MAU Note (Signed)
+  HPT on 9/7. Wants to check and see how far along she is. Was sent over from UC, they did not do a test.   Was told we could do the test, [place] and other stuff.  Just wanting to see if it is true,  denies any pain or bleeding

## 2020-11-28 ENCOUNTER — Ambulatory Visit (INDEPENDENT_AMBULATORY_CARE_PROVIDER_SITE_OTHER): Payer: Medicaid Other

## 2020-11-28 ENCOUNTER — Encounter: Payer: Self-pay | Admitting: *Deleted

## 2020-11-28 ENCOUNTER — Other Ambulatory Visit: Payer: Self-pay

## 2020-11-28 VITALS — BP 110/73 | HR 60 | Ht 62.0 in | Wt 207.1 lb

## 2020-11-28 DIAGNOSIS — O34219 Maternal care for unspecified type scar from previous cesarean delivery: Secondary | ICD-10-CM

## 2020-11-28 DIAGNOSIS — Z3A11 11 weeks gestation of pregnancy: Secondary | ICD-10-CM

## 2020-11-28 DIAGNOSIS — O0990 Supervision of high risk pregnancy, unspecified, unspecified trimester: Secondary | ICD-10-CM | POA: Insufficient documentation

## 2020-11-28 DIAGNOSIS — O3680X0 Pregnancy with inconclusive fetal viability, not applicable or unspecified: Secondary | ICD-10-CM

## 2020-11-28 DIAGNOSIS — O0991 Supervision of high risk pregnancy, unspecified, first trimester: Secondary | ICD-10-CM

## 2020-11-28 DIAGNOSIS — S3769XA Other injury of uterus, initial encounter: Secondary | ICD-10-CM

## 2020-11-28 MED ORDER — BLOOD PRESSURE KIT DEVI: 1.0000 | 0 refills | Status: DC

## 2020-11-28 MED ORDER — GOJJI WEIGHT SCALE MISC
1.0000 | 0 refills | Status: DC
Start: 2020-11-28 — End: 2021-01-20

## 2020-11-28 NOTE — Progress Notes (Signed)
Patient was assessed and managed by nursing staff during this encounter. I have reviewed the chart and agree with the documentation and plan. Patient with a very complex history, has cardiac arrest during last delivery also complicated by uterine rupture and stillbirth. Very high risk pregnancy. History of cesarean section x 6, increased risk of placenta previa/accreta spectrum.  MFM referral placed, they will meet with patient and discuss surveillance scans and recommendations about care/delivery for this pregnancy.  Jaynie Collins, MD 11/28/2020 10:30 AM

## 2020-11-28 NOTE — Progress Notes (Signed)
New OB Intake  I connected with  Cheryl Fischer on 11/28/20 at  8:15 AM EDT by in person and verified that I am speaking with the correct person using two identifiers. Nurse is located at [facility] and pt is located at [place].  I discussed the limitations, risks, security and privacy concerns of performing an evaluation and management service by telephone and the availability of in person appointments. I also discussed with the patient that there may be a patient responsible charge related to this service. The patient expressed understanding and agreed to proceed.  I explained I am completing New OB Intake today. We discussed her EDD of 06/17/21 that is based on LMP of 09/10/20. Pt is G7/P5107. I reviewed her allergies, medications, Medical/Surgical/OB history, and appropriate screenings. I informed her of [facility] services. Based on history, this is a/an  pregnancy complicated by Hx of Stillbirth and uterine rupture .   Patient Active Problem List   Diagnosis Date Noted   Supervision of high risk pregnancy, antepartum 11/28/2020   Encounter for postoperative wound care 10/22/2018   Status post cesarean section 10/13/2018   Hemorrhagic shock (HCC) 11-10-2018   Uterine rupture 11/10/2018   Neonatal death Nov 10, 2018   Acute blood loss anemia Nov 10, 2018   Hypoxia    Respiratory failure (HCC)    Cardiac arrest (HCC)    Gestational diabetes mellitus (GDM) 08/21/2018   Obesity during pregnancy 08/17/2018   H/O multiple gestation 05/08/2018   Elevated blood pressure reading without diagnosis of hypertension 05/08/2018   Supervision of other normal pregnancy, antepartum 04/24/2018   History of cesarean delivery, currently pregnant 04/24/2018    Concerns addressed today  Delivery Plans:  Plans to deliver at [facility]   MyChart/Babyscripts MyChart access verified. I explained pt will have some visits in office and some virtually. Babyscripts instructions given and order placed. Patient verifies receipt  of registration text/e-mail. Account successfully created and app downloaded.  Blood Pressure Cuff  Blood pressure cuff ordered for patient to pick-up from [employer]. Explained after first prenatal appt pt will check weekly and document in Babyscripts.  Weight scale: Patient does not have weight scale. Weight scale ordered for patient to pick up form [facility].   Anatomy US Explained first scheduled Korea will be around 19 weeks. Dating and viability scan performed today. Labs Discussed Avelina Laine genetic screening with patient. Would like both Panorama and Horizon drawn at new OB visit. Routine prenatal labs needed.  Covid Vaccine Patient has not covid vaccine.   Mother/ Baby Dyad Candidate?    If yes, offer as possibility  Informed patient of [facility] website  and placed link in her AVS.   Social Determinants of Health Food Insecurity: Patient denies food insecurity. WIC Referral: Patient is interested in referral to [facility].  Transportation: Patient denies transportation needs. Childcare: Discussed no children allowed at ultrasound appointments. Offered childcare services; patient declines childcare services at this time.  Send link to Pregnancy Navigators   Placed OB Box on problem list and updated  First visit review I reviewed new OB appt with pt. I explained she will have a pelvic exam, ob bloodwork with genetic screening, and PAP smear. Explained pt will be seen by Peggy Constant at first visit; encounter routed to appropriate provider. Explained that patient will be seen by pregnancy navigator following visit with provider. [facility] information placed in AVS.   Hamilton Capri, RN 11/28/2020  8:50 AM

## 2020-12-02 ENCOUNTER — Ambulatory Visit: Payer: Medicaid Other | Admitting: *Deleted

## 2020-12-02 ENCOUNTER — Ambulatory Visit (HOSPITAL_BASED_OUTPATIENT_CLINIC_OR_DEPARTMENT_OTHER): Payer: Medicaid Other | Admitting: Maternal & Fetal Medicine

## 2020-12-02 ENCOUNTER — Other Ambulatory Visit: Payer: Self-pay

## 2020-12-02 ENCOUNTER — Ambulatory Visit: Payer: Medicaid Other | Attending: Obstetrics & Gynecology

## 2020-12-02 ENCOUNTER — Encounter: Payer: Self-pay | Admitting: *Deleted

## 2020-12-02 ENCOUNTER — Ambulatory Visit: Payer: Medicaid Other

## 2020-12-02 VITALS — BP 103/67 | HR 61

## 2020-12-02 DIAGNOSIS — O34219 Maternal care for unspecified type scar from previous cesarean delivery: Secondary | ICD-10-CM | POA: Diagnosis not present

## 2020-12-02 DIAGNOSIS — Z8632 Personal history of gestational diabetes: Secondary | ICD-10-CM | POA: Insufficient documentation

## 2020-12-02 DIAGNOSIS — E669 Obesity, unspecified: Secondary | ICD-10-CM

## 2020-12-02 DIAGNOSIS — S3769XD Other injury of uterus, subsequent encounter: Secondary | ICD-10-CM | POA: Diagnosis present

## 2020-12-02 DIAGNOSIS — O0990 Supervision of high risk pregnancy, unspecified, unspecified trimester: Secondary | ICD-10-CM

## 2020-12-02 DIAGNOSIS — Z3A11 11 weeks gestation of pregnancy: Secondary | ICD-10-CM

## 2020-12-02 DIAGNOSIS — S3769XA Other injury of uterus, initial encounter: Secondary | ICD-10-CM | POA: Insufficient documentation

## 2020-12-02 DIAGNOSIS — O09291 Supervision of pregnancy with other poor reproductive or obstetric history, first trimester: Secondary | ICD-10-CM | POA: Diagnosis not present

## 2020-12-02 DIAGNOSIS — O99211 Obesity complicating pregnancy, first trimester: Secondary | ICD-10-CM | POA: Diagnosis not present

## 2020-12-02 DIAGNOSIS — O0991 Supervision of high risk pregnancy, unspecified, first trimester: Secondary | ICD-10-CM

## 2020-12-02 NOTE — Progress Notes (Signed)
MFM Consultation  [name] was 30 yo G7P7 who is at 59 w 6 d is here in consultation at the request of Dr. Harolyn Fischer concerning prior history of uterine rupture.  She has an EDD of 06/17/21 that is consistent with today's examination.  She has not had genetic or carrier screening but plans to have these labs drawn on 12/22/20.  Her pregnancy issues include:  1) Prior uterine rupture in 2020: [name] had a planned repeat cesarean but arrived via EMS with abdominal pain at 60 w 2d. The FHR was bradycardiac and an emergent cesarean delivery was performed. A uterine rupture was identified but [name] transitioned to hemorrhagic shock. She received 7u PRBC's, 5 u FFP and 1 plt and 2 albumin per the Op note on 08/27. The EBL was 4800. The fetal demise was diagnosed. [name] ultimately recovered but did not pursue contraception.  Today she is here surprised of the pregnancy but adamant about a bilateral tubal ligation at the conclusion of this pregnancy.  2) Multiple cesarean deliveries 6 prior cesarean deliveries  3) History of gestational diabetes [name] had A2GDM managed on oral therapy. She has not had early screening yet. She did not have postpartum screening  Vitals with BMI 12/02/2020 11/28/2020 10/24/2020  Height - _0  _1   Weight - 207 lbs 2 oz 211 lbs 13 oz  BMI - 62.86 38.17  Systolic 711 657 903  Diastolic 67 73 66  Pulse 61 60 63   CBC Latest Ref Rng & Units 07/23/2020 10/14/2018 10/13/2018  WBC 4.0 - 10.5 K/uL 8.4 10.2 13.4(H)  Hemoglobin 12.0 - 15.0 g/dL 14.2 10.7(L) 12.1  Hematocrit 36.0 - 46.0 % 43.1 31.2(L) 34.1(L)  Platelets 150 - 400 K/uL 276 119(L) 124(L)   CMP Latest Ref Rng & Units 07/23/2020 10/14/2018 10/13/2018  Glucose 70 - 99 mg/dL 106(H) 93 114(H)  BUN 6 - 20 mg/dL _2 Creatinine 0.44 - 1.00 mg/dL 0.82 0.78 0.72  Sodium 135 - 145 mmol/L 139 137 140  Potassium 3.5 - 5.1 mmol/L 3.9 3.5 3.4(L)  Chloride 98 - 111 mmol/L 107 104 104  CO2 22 - 32 mmol/L  _3 Calcium 8.9 - 10.3 mg/dL 8.5(L) 8.3(L) 8.3(L)  Total Protein 6.5 - 8.1 g/dL 5.9(L) 5.0(L) -  Total Bilirubin 0.3 - 1.2 mg/dL 0.5 1.3(H) -  Alkaline Phos 38 - 126 U/L 49 80 -  AST 15 - 41 U/L 24 53(H) -  ALT 0 - 44 U/L 25 49(H) -   OB History  Gravida Para Term Preterm AB Living  _4 0 7  SAB IAB Ectopic Multiple Live Births  0 0 0 2 7    # Outcome Date GA Lbr Len/2nd Weight Sex Delivery Anes PTL Lv  7 Current           6 Term 10/12/18 [redacted]w[redacted]d 7 lb 1 oz (3.204 kg) M CS-LVertical Gen  FD     Name: Cheryl Fischer,Cheryl Fischer     Apgar1: 0  Apgar5: 0  5 Term 03/12/17    M CS-LTranv   LIV  4A Preterm 12/29/15 382w0d F CS-LVertical   LIV  4B Preterm 12/29/15 3448w0dM CS-LVertical   LIV  3 Term 12/27/14    M CS-LVertical   LIV  2A Term 06/24/11    M CS-LVertical   LIV  2B Term 06/24/11    M CS-LVertical   LIV  1  Term 05/01/10    F CS-LVertical   LIV    Obstetric Comments  Twins    Past Medical History:  Diagnosis Date   Anemia    Blood transfusion without reported diagnosis    Vaginal Pap smear, abnormal    Past Surgical History:  Procedure Laterality Date   CESAREAN SECTION  05/01/2010   CESAREAN SECTION N/A 10/12/2018   Procedure: CESAREAN SECTION;  Surgeon: Cheryl Jude, MD;  Location: [facility];  Service: Obstetrics;  Laterality: N/A;   CESAREAN SECTION  06/24/2011   CESAREAN SECTION  12/27/2014   CESAREAN SECTION  03/12/2017   CESAREAN SECTION  12/29/2015   Social History   Socioeconomic History   Marital status: Single    Spouse name: Not on file   Number of children: 7   Years of education: Not on file   Highest education level: Not on file  Occupational History   Not on file  Tobacco Use   Smoking status: Never   Smokeless tobacco: Never  Vaping Use   Vaping Use: Never used  Substance and Sexual Activity   Alcohol use: Never   Drug use: Never   Sexual activity: Yes    Birth control/protection: None    Comment: currently pregnant  Other Topics  Concern   Not on file  Social History Narrative   Not on file   Social Determinants of Health   Financial Resource Strain: Not on file  Food Insecurity: Not on file  Transportation Needs: Not on file  Physical Activity: Not on file  Stress: Not on file  Social Connections: Not on file  Intimate Partner Violence: Not on file         Current Outpatient Medications (Other):    Blood Pressure Monitoring (BLOOD PRESSURE KIT) DEVI, 1 kit by Does not apply route once a week.   Misc. Devices (GOJJI WEIGHT SCALE) MISC, 1 Device by Does not apply route every 30 (thirty) days.   Prenatal Vit-Fe Fumarate-FA (MULTIVITAMIN-PRENATAL) 27-0.8 MG TABS tablet, Take 1 tablet by mouth daily at 12 noon. Family History  Problem Relation Age of Onset   Diabetes Maternal Grandmother    Cancer Paternal Grandmother    Allergies  Allergen Reactions   Penicillins Hives, Shortness Of Breath and Swelling    Did it involve swelling of the face/tongue/throat, SOB, or low BP? Yes Did it involve sudden or severe rash/hives, skin peeling, or any reaction on the inside of your mouth or nose? Yes Did you need to seek medical attention at a hospital or doctor's office? Unknown When did it last happen? Childhood reaction      If all above answers are "NO", may proceed with cephalosporin use.    Imaging Single intrauterine pregnancy with measurements consistent with dates. The nuchal measurement today was 1.12 mm The first trimester anatomy was normal, however, a detailed anatomy was precluded due to early gestational age.  A first trimester screen was not performed today she will have the NIPS drawn the first week of November.  Impresson/Counseling:  1) History of uterine rupture I discussed with [name] that the recurrence risk for uterine rupture is wide ranging from 0-40%. There is little evidence for predictive measures for recurrence. Some have assess uterine wall thickness from the interface to the  bladder to the edge of the decidua. However, this is not a common accepted practice nationally.  We discussed the mainstay of management is to be aware of signs/symptoms of recurrent uterine separation including  acute abdominal pain and secondly to deliver prior to the onset of labor usually between 36-37 weeks, for Ms. Persley I would lean closer to 36 weeks, with BMZ administration with the week prior to delivery. Shared decision making is important with regards to timing of delivery, individualizing Ms. Delaine's care is important in this case given her risk factors of grandmultiparity and prior uterine rupture.  Secondly, as she receives growth exams it is likely that we may see a uterine scar defect. In such cases, we don't recommend delivery planning around this measurement, unless there appears to be a extrusion of the sac containing fetal membranes,  otherwise thinning alone would not be an indication for delivery, but clinical symptoms and presentation should be weighted strongly.  Lastly, we discussed the increased risk for bleeding, maternal and fetal death as well as hysterectomy if a uterine rupture would to reoccur. She wishes to continue the pregnancy but is sensitive to the high risk nature of the pregnancy.  2) Prior cesarean delivery x 6 Delivery between 36-37 weeks as mentioned above.  Ms. Amico has an anterior placenta, I discussed the possible increased risk for abnormal placental adherence such as an accreta. We will reassess the uterine placental interface as the pregnancy progresses.    3) History of A2 GDM.  We recommend early Glucola screening and postpartum testing as well.  I spent 60 minutes with Ms. Ketcher with > 50% in face to face consultation.  Vikki Ports, MD.   All questions answered.

## 2020-12-03 ENCOUNTER — Other Ambulatory Visit: Payer: Self-pay | Admitting: *Deleted

## 2020-12-03 DIAGNOSIS — Z6832 Body mass index (BMI) 32.0-32.9, adult: Secondary | ICD-10-CM

## 2020-12-03 DIAGNOSIS — O34219 Maternal care for unspecified type scar from previous cesarean delivery: Secondary | ICD-10-CM

## 2020-12-03 DIAGNOSIS — Z8674 Personal history of sudden cardiac arrest: Secondary | ICD-10-CM

## 2020-12-03 DIAGNOSIS — O09299 Supervision of pregnancy with other poor reproductive or obstetric history, unspecified trimester: Secondary | ICD-10-CM

## 2020-12-18 ENCOUNTER — Other Ambulatory Visit: Payer: Self-pay

## 2020-12-18 ENCOUNTER — Inpatient Hospital Stay (HOSPITAL_COMMUNITY)
Admission: AD | Admit: 2020-12-18 | Discharge: 2020-12-18 | Disposition: A | Discharge status: Home / Self Care | Payer: Medicaid Other | Attending: Family Medicine | Admitting: Family Medicine

## 2020-12-18 ENCOUNTER — Encounter (HOSPITAL_COMMUNITY): Payer: Self-pay | Admitting: *Deleted

## 2020-12-18 DIAGNOSIS — O0990 Supervision of high risk pregnancy, unspecified, unspecified trimester: Secondary | ICD-10-CM

## 2020-12-18 DIAGNOSIS — O98819 Other maternal infectious and parasitic diseases complicating pregnancy, unspecified trimester: Secondary | ICD-10-CM | POA: Diagnosis not present

## 2020-12-18 DIAGNOSIS — N898 Other specified noninflammatory disorders of vagina: Secondary | ICD-10-CM | POA: Diagnosis present

## 2020-12-18 DIAGNOSIS — B3731 Acute candidiasis of vulva and vagina: Secondary | ICD-10-CM | POA: Diagnosis not present

## 2020-12-18 DIAGNOSIS — Z3A00 Weeks of gestation of pregnancy not specified: Secondary | ICD-10-CM

## 2020-12-18 LAB — WET PREP, GENITAL
Clue Cells Wet Prep HPF POC: NONE SEEN
Sperm: NONE SEEN
Trich, Wet Prep: NONE SEEN
WBC, Wet Prep HPF POC: NONE SEEN

## 2020-12-18 LAB — URINALYSIS, ROUTINE W REFLEX MICROSCOPIC
Bilirubin Urine: NEGATIVE
Glucose, UA: NEGATIVE mg/dL
Hgb urine dipstick: NEGATIVE
Ketones, ur: 5 mg/dL — AB
Leukocytes,Ua: NEGATIVE
Nitrite: NEGATIVE
Protein, ur: NEGATIVE mg/dL
Specific Gravity, Urine: 1.016 (ref 1.005–1.030)
pH: 7 (ref 5.0–8.0)

## 2020-12-18 MED ORDER — FLUCONAZOLE 150 MG PO TABS
150.0000 mg | ORAL_TABLET | Freq: Once | ORAL | Status: AC
  Administered 2020-12-18: 150 mg via ORAL
  Filled 2020-12-18 (×2): qty 1

## 2020-12-18 NOTE — MAU Note (Signed)
Sent from [facility] for c/o pink vaginal discharge and itching that  started 3 days ago. Denies any pain or cramping.

## 2020-12-18 NOTE — ED Triage Notes (Signed)
Pt reports vaginal itching with burning irritation to vaginal area. Today having light pink discharge. Pt reports being approx [redacted] weeks pregnant.

## 2020-12-18 NOTE — ED Provider Notes (Signed)
Emergency Medicine Provider Triage Evaluation Note  Cheryl Fischer , a 30 y.o. female  was evaluated in triage.  Pt complains of vaginal itching.  Review of Systems  Positive: pregnant Negative: pain  Physical Exam  BP 125/70 (BP Location: Right Arm)   Pulse 73   Temp 97.6 F (36.4 C) (Oral)   Resp 14   LMP 09/10/2020   SpO2 97%  Gen:   Awake, no distress   Resp:  Normal effort  MSK:   Moves extremities without difficulty    Medical Decision Making  Medically screening exam initiated at 11:34 AM.  Appropriate orders placed.  Cheryl Fischer was informed that the remainder of the evaluation will be completed by another provider, this initial triage assessment does not replace that evaluation, and the importance of remaining in the ED until their evaluation is complete.  Discussed with Dr. Crissie Reese who accepts patient to MAU   Cheryl Grizzle, MD 12/18/20 1136

## 2020-12-18 NOTE — ED Notes (Signed)
Report called to MAU  

## 2020-12-18 NOTE — MAU Provider Note (Signed)
S [name] is a 30 y.o. [phone] patient who presents to MAU today with complaint of vaginal discharge.   Reports several days of clumpy white vaginal discharge. Concerned about STI's. No abdominal pain. Thought maybe her tissue was pink when she wiped but no frank vaginal bleeding. No other concerns.   O BP 109/71   Pulse 76   Temp 97.6 F (36.4 C) (Oral)   Resp 18   Ht 5\' 2"  (1.575 m)   Wt 94.8 kg   LMP 09/10/2020   SpO2 97%   BMI 38.23 kg/m  Physical Exam Vitals reviewed.  Constitutional:      General: She is not in acute distress.    Appearance: She is well-developed. She is not diaphoretic.  Eyes:     General: No scleral icterus. Pulmonary:     Effort: Pulmonary effort is normal. No respiratory distress.  Skin:    General: Skin is warm and dry.  Neurological:     Mental Status: She is alert.     Coordination: Coordination normal.     Lab Orders         Wet prep, genital         Urinalysis, Routine w reflex microscopic Urine, Clean Catch     Recent Results (from the past 2160 hour(s))  Wet prep, genital     Status: Abnormal   Collection Time: 12/18/20 12:00 PM  Result Value Ref Range   Yeast Wet Prep HPF POC PRESENT (A) NONE SEEN   Trich, Wet Prep NONE SEEN NONE SEEN   Clue Cells Wet Prep HPF POC NONE SEEN NONE SEEN   WBC, Wet Prep HPF POC NONE SEEN NONE SEEN   Sperm NONE SEEN     Comment: Performed at [facility], [address], [place], [place]    A Medical screening exam complete Yeast vaginitis  P Given dose of oral fluconazole Discussed GC/TC swab will take longer to come back Warning signs for worsening condition that would warrant emergency follow-up discussed Patient may return to MAU as needed  Has OB follow up appt on Monday  Wednesday, MD 12/18/2020 1:05 PM

## 2020-12-19 LAB — GC/CHLAMYDIA PROBE AMP (~~LOC~~) NOT AT ARMC
Chlamydia: NEGATIVE
Comment: NEGATIVE
Comment: NORMAL
Neisseria Gonorrhea: NEGATIVE

## 2020-12-22 ENCOUNTER — Other Ambulatory Visit: Payer: Self-pay

## 2020-12-22 ENCOUNTER — Encounter (HOSPITAL_COMMUNITY): Payer: Self-pay | Admitting: Obstetrics and Gynecology

## 2020-12-22 ENCOUNTER — Other Ambulatory Visit (HOSPITAL_COMMUNITY)
Admission: RE | Admit: 2020-12-22 | Discharge: 2020-12-22 | Disposition: A | Discharge status: Home / Self Care | Payer: Medicaid Other | Source: Ambulatory Visit | Attending: Obstetrics and Gynecology | Admitting: Obstetrics and Gynecology

## 2020-12-22 ENCOUNTER — Ambulatory Visit (INDEPENDENT_AMBULATORY_CARE_PROVIDER_SITE_OTHER): Payer: Medicaid Other | Admitting: Obstetrics and Gynecology

## 2020-12-22 ENCOUNTER — Inpatient Hospital Stay (HOSPITAL_COMMUNITY): Payer: Medicaid Other

## 2020-12-22 ENCOUNTER — Encounter: Payer: Self-pay | Admitting: Obstetrics and Gynecology

## 2020-12-22 ENCOUNTER — Inpatient Hospital Stay (HOSPITAL_COMMUNITY)
Admission: AD | Admit: 2020-12-22 | Discharge: 2020-12-22 | Disposition: A | Discharge status: Home / Self Care | Payer: Medicaid Other | Attending: Obstetrics and Gynecology | Admitting: Obstetrics and Gynecology

## 2020-12-22 VITALS — BP 137/77 | HR 67 | Wt 207.0 lb

## 2020-12-22 DIAGNOSIS — Z6790 Unspecified blood type, Rh positive: Secondary | ICD-10-CM

## 2020-12-22 DIAGNOSIS — Z880 Allergy status to penicillin: Secondary | ICD-10-CM | POA: Diagnosis not present

## 2020-12-22 DIAGNOSIS — O99210 Obesity complicating pregnancy, unspecified trimester: Secondary | ICD-10-CM

## 2020-12-22 DIAGNOSIS — O09299 Supervision of pregnancy with other poor reproductive or obstetric history, unspecified trimester: Secondary | ICD-10-CM | POA: Insufficient documentation

## 2020-12-22 DIAGNOSIS — K439 Ventral hernia without obstruction or gangrene: Secondary | ICD-10-CM | POA: Insufficient documentation

## 2020-12-22 DIAGNOSIS — O0992 Supervision of high risk pregnancy, unspecified, second trimester: Secondary | ICD-10-CM | POA: Diagnosis not present

## 2020-12-22 DIAGNOSIS — O99212 Obesity complicating pregnancy, second trimester: Secondary | ICD-10-CM

## 2020-12-22 DIAGNOSIS — O34219 Maternal care for unspecified type scar from previous cesarean delivery: Secondary | ICD-10-CM

## 2020-12-22 DIAGNOSIS — O09292 Supervision of pregnancy with other poor reproductive or obstetric history, second trimester: Secondary | ICD-10-CM

## 2020-12-22 DIAGNOSIS — O418X20 Other specified disorders of amniotic fluid and membranes, second trimester, not applicable or unspecified: Secondary | ICD-10-CM

## 2020-12-22 DIAGNOSIS — O0990 Supervision of high risk pregnancy, unspecified, unspecified trimester: Secondary | ICD-10-CM

## 2020-12-22 DIAGNOSIS — O468X2 Other antepartum hemorrhage, second trimester: Secondary | ICD-10-CM

## 2020-12-22 DIAGNOSIS — Z3A14 14 weeks gestation of pregnancy: Secondary | ICD-10-CM | POA: Insufficient documentation

## 2020-12-22 DIAGNOSIS — O209 Hemorrhage in early pregnancy, unspecified: Secondary | ICD-10-CM

## 2020-12-22 DIAGNOSIS — K429 Umbilical hernia without obstruction or gangrene: Secondary | ICD-10-CM | POA: Insufficient documentation

## 2020-12-22 DIAGNOSIS — Z8632 Personal history of gestational diabetes: Secondary | ICD-10-CM

## 2020-12-22 DIAGNOSIS — O208 Other hemorrhage in early pregnancy: Secondary | ICD-10-CM | POA: Insufficient documentation

## 2020-12-22 LAB — GC/CHLAMYDIA PROBE AMP (~~LOC~~) NOT AT ARMC
Chlamydia: NEGATIVE
Comment: NEGATIVE
Comment: NORMAL
Neisseria Gonorrhea: NEGATIVE

## 2020-12-22 LAB — WET PREP, GENITAL
Clue Cells Wet Prep HPF POC: NONE SEEN
Sperm: NONE SEEN
Trich, Wet Prep: NONE SEEN
Yeast Wet Prep HPF POC: NONE SEEN

## 2020-12-22 LAB — COMPREHENSIVE METABOLIC PANEL
ALT: 16 U/L (ref 0–44)
AST: 14 U/L — ABNORMAL LOW (ref 15–41)
Albumin: 2.8 g/dL — ABNORMAL LOW (ref 3.5–5.0)
Alkaline Phosphatase: 44 U/L (ref 38–126)
Anion gap: 7 (ref 5–15)
BUN: 8 mg/dL (ref 6–20)
CO2: 23 mmol/L (ref 22–32)
Calcium: 8.5 mg/dL — ABNORMAL LOW (ref 8.9–10.3)
Chloride: 105 mmol/L (ref 98–111)
Creatinine, Ser: 0.68 mg/dL (ref 0.44–1.00)
GFR, Estimated: >60 mL/min (ref >60)
Glucose, Bld: 90 mg/dL (ref 70–99)
Potassium: 3.4 mmol/L — ABNORMAL LOW (ref 3.5–5.1)
Sodium: 135 mmol/L (ref 135–145)
Total Bilirubin: 0.4 mg/dL (ref 0.3–1.2)
Total Protein: 5.8 g/dL — ABNORMAL LOW (ref 6.5–8.1)

## 2020-12-22 LAB — CBC
HCT: 35.7 % — ABNORMAL LOW (ref 36.0–46.0)
Hemoglobin: 12.3 g/dL (ref 12.0–15.0)
MCH: 31.6 pg (ref 26.0–34.0)
MCHC: 34.5 g/dL (ref 30.0–36.0)
MCV: 91.8 fL (ref 80.0–100.0)
Platelets: 197 10*3/uL (ref 150–400)
RBC: 3.89 MIL/uL (ref 3.87–5.11)
RDW: 12.2 % (ref 11.5–15.5)
WBC: 9.5 10*3/uL (ref 4.0–10.5)
nRBC: 0 % (ref 0.0–0.2)

## 2020-12-22 MED ORDER — ASPIRIN EC 81 MG PO TBEC: 81.0000 mg | DELAYED_RELEASE_TABLET | Freq: Every day | ORAL | 2 refills | Status: DC

## 2020-12-22 MED ORDER — VITAFOL GUMMIES 3.33-0.333-34.8 MG PO CHEW: 2.0000 | CHEWABLE_TABLET | Freq: Every day | ORAL | 6 refills | Status: AC

## 2020-12-22 NOTE — Progress Notes (Signed)
Refill on prenatal vitamin Pap smear today

## 2020-12-22 NOTE — Patient Instructions (Signed)

## 2020-12-22 NOTE — Progress Notes (Signed)
Subjective:    Cheryl Fischer is a [id number] [redacted]w[redacted]d being seen today for her first obstetrical visit.  Her obstetrical history is significant for obesity and previous cesarean section x 6, with last one at 38 weeks with uterine rupture and fetal demise. Patient when into hemorrhagic shock and cardiac arrest. History of gestational diabetes . Patient does intend to breast feed. Pregnancy history fully reviewed.  Patient reports bleeding diagnosed with subchorionic hemorrhage today.  Vitals:   12/22/20 1524  BP: 137/77  Pulse: 67  Weight: 207 lb (93.9 kg)    HISTORY: OB History  Gravida Para Term Preterm AB Living  8 6 5 1   7   SAB IAB Ectopic Multiple Live Births        2 7    # Outcome Date GA Lbr Len/2nd Weight Sex Delivery Anes PTL Lv  8 Current           7 Term 10/12/18 [redacted]w[redacted]d  7 lb 1 oz (3.204 kg) M CS-LVertical Gen  FD  6 Gravida 09/27/18        FD  5 Term 03/12/17    M CS-LTranv   LIV  4A Preterm 12/29/15 [redacted]w[redacted]d   F CS-LVertical   LIV  4B Preterm 12/29/15 [redacted]w[redacted]d   M CS-LVertical   LIV  3 Term 12/27/14    M CS-LVertical   LIV  2A Term 06/24/11    M CS-LVertical   LIV  2B Term 06/24/11    M CS-LVertical   LIV  1 Term 05/01/10    F CS-LVertical   LIV    Obstetric Comments  Twins   Past Medical History:  Diagnosis Date   Anemia    Blood transfusion without reported diagnosis    Vaginal Pap smear, abnormal    Past Surgical History:  Procedure Laterality Date   CESAREAN SECTION  05/01/2010   CESAREAN SECTION N/A 10/12/2018   Procedure: CESAREAN SECTION;  Surgeon: 10/14/2018, MD;  Location: [facility];  Service: Obstetrics;  Laterality: N/A;   CESAREAN SECTION  06/24/2011   CESAREAN SECTION  12/27/2014   CESAREAN SECTION  03/12/2017   CESAREAN SECTION  12/29/2015   Family History  Problem Relation Age of Onset   Diabetes Maternal Grandmother    Cancer Paternal Grandmother      Exam    Uterus:   15 weeks  Pelvic Exam:    Perineum: No Hemorrhoids, Normal  Perineum   Vulva: normal   Vagina:  normal mucosa, normal discharge   pH:    Cervix: nulliparous appearance and closed and long   Adnexa: normal adnexa and no mass, fullness, tenderness   Bony Pelvis: gynecoid  System: Breast:  normal appearance, no masses or tenderness   Skin: normal coloration and turgor, no rashes    Neurologic: oriented, no focal deficits   Extremities: normal strength, tone, and muscle mass   HEENT extra ocular movement intact   Mouth/Teeth mucous membranes moist, pharynx normal without lesions and dental hygiene good   Neck supple and no masses   Cardiovascular: regular rate and rhythm   Respiratory:  appears well, vitals normal, no respiratory distress, acyanotic, normal RR, chest clear, no wheezing, crepitations, rhonchi, normal symmetric air entry   Abdomen: soft, non-tender; bowel sounds normal; no masses,  no organomegaly, large umbilical heria   Urinary:       Assessment:    Pregnancy: 12/31/2015 Patient Active Problem List   Diagnosis Date Noted   History of gestational diabetes  in prior pregnancy, currently pregnant 12/22/2020   Supervision of high risk pregnancy, antepartum 11/28/2020   Encounter for postoperative wound care 10/22/2018   Status post cesarean section 10/13/2018   Hemorrhagic shock (HCC) 20-Oct-2018   Uterine rupture 2018-10-20   Neonatal death 20-Oct-2018   Acute blood loss anemia Oct 20, 2018   Hypoxia    Respiratory failure (HCC)    Cardiac arrest (HCC)    Gestational diabetes mellitus (GDM) 08/21/2018   Obesity during pregnancy 08/17/2018   H/O multiple gestation 05/08/2018   Elevated blood pressure reading without diagnosis of hypertension 05/08/2018   Supervision of other normal pregnancy, antepartum 04/24/2018   History of cesarean delivery, currently pregnant 04/24/2018        Plan:     Initial labs drawn. Prenatal vitamins. Problem list reviewed and updated. Genetic Screening discussed : panorama ordered.   Ultrasound discussed; fetal survey: ordered. Rx ASA provided  Follow up in 4 weeks. 50% of 30 min visit spent on counseling and coordination of care.     Cheryl Fischer 12/22/2020

## 2020-12-22 NOTE — Discharge Instructions (Signed)

## 2020-12-22 NOTE — MAU Note (Addendum)
..  Cheryl Fischer is a 30 y.o. at [redacted]w[redacted]d here in MAU reporting: Via EMS with co/ pf light pink spotting since yesterday once at 1800 and again at 0130 this am. Pt denies cramping, with 0/10 pain. No intercourse in the last 24 hours. Pt denies LOF and abnormal discharge.  LMP: 11/11/2020 Pain score: 0/10 Vitals:   12/22/20 0220 12/22/20 0230  BP: 122/70 111/81  Pulse: 62 64  Resp: 18   Temp: 98.9 F (37.2 C)   SpO2: 100% 100%     FHT:159  Lab orders placed from triage:

## 2020-12-22 NOTE — Addendum Note (Signed)
Addended by: Cheree Ditto, Jana Swartzlander A on: 12/22/2020 04:45 PM   Modules accepted: Orders

## 2020-12-22 NOTE — MAU Provider Note (Signed)
History     CSN: [id number]  Arrival date and time: 12/22/20 2725   Event Date/Time   First Provider Initiated Contact with Patient 12/22/20 0403      Chief Complaint  Patient presents with   Vaginal Bleeding   [name] is a 30 y.o. [id number] at [redacted]w[redacted]d who presents to MAU for vaginal bleeding which began yesterday at [facility]. Patient reported 2 episodes of light pink vaginal bleeding, one yesterday and another this morning at 130AM. Patient denies pain. Bleeding is not present at this time.  Blood Type? O Positive Current PNC & next appt? [place], NOB appt later today   OB History     Gravida  8   Para  6   Term  5   Preterm  1   AB      Living  7      SAB      IAB      Ectopic      Multiple  2   Live Births  7        Obstetric Comments  Twins         Past Medical History:  Diagnosis Date   Anemia    Blood transfusion without reported diagnosis    Vaginal Pap smear, abnormal     Past Surgical History:  Procedure Laterality Date   CESAREAN SECTION  05/01/2010   CESAREAN SECTION N/A 10/12/2018   Procedure: CESAREAN SECTION;  Surgeon: Donnamae Jude, MD;  Location: [facility] LD [facility]S;  Service: Obstetrics;  Laterality: N/A;   CESAREAN SECTION  06/24/2011   CESAREAN SECTION  12/27/2014   CESAREAN SECTION  03/12/2017   CESAREAN SECTION  12/29/2015    Family History  Problem Relation Age of Onset   Diabetes Maternal Grandmother    Cancer Paternal Grandmother     Social History   Tobacco Use   Smoking status: Never   Smokeless tobacco: Never  Vaping Use   Vaping Use: Never used  Substance Use Topics   Alcohol use: Never   Drug use: Never    Allergies:  Allergies  Allergen Reactions   Penicillins Hives, Shortness Of Breath and Swelling    Did it involve swelling of the face/tongue/throat, SOB, or low BP? Yes Did it involve sudden or severe rash/hives, skin peeling, or any reaction on the inside of your mouth or nose? Yes Did you need to seek  medical attention at a hospital or doctor's office? Unknown When did it last happen? Childhood reaction      If all above answers are "NO", may proceed with cephalosporin use.     Medications Prior to Admission  Medication Sig Dispense Refill Last Dose   Prenatal Vit-Fe Fumarate-FA (MULTIVITAMIN-PRENATAL) 27-0.8 MG TABS tablet Take 1 tablet by mouth daily at 12 noon.   12/21/2020   Blood Pressure Monitoring (BLOOD PRESSURE KIT) DEVI 1 kit by Does not apply route once a week. 1 each 0    Misc. Devices (GOJJI WEIGHT SCALE) MISC 1 Device by Does not apply route every 30 (thirty) days. 1 each 0     Review of Systems  Constitutional:  Negative for chills, diaphoresis, fatigue and fever.  Eyes:  Negative for visual disturbance.  Respiratory:  Negative for shortness of breath.   Cardiovascular:  Negative for chest pain.  Gastrointestinal:  Negative for abdominal pain, constipation, diarrhea, nausea and vomiting.  Genitourinary:  Positive for vaginal bleeding. Negative for dysuria, flank pain, frequency, pelvic pain, urgency and vaginal discharge.  Neurological:  Negative for dizziness, weakness, light-headedness and headaches.   Physical Exam   Blood pressure 111/81, pulse 64, temperature 98.9 F (37.2 C), temperature source Oral, resp. rate 18, height $RemoveBe'5\' 2"'aqmoPQuKH$  (1.575 m), weight 94.8 kg, last menstrual period 09/10/2020, SpO2 100 %.  Patient Vitals for the past 24 hrs:  BP Temp Temp src Pulse Resp SpO2 Height Weight  12/22/20 0230 111/81 -- -- 64 -- 100 % -- --  12/22/20 0220 122/70 98.9 F (37.2 C) Oral 62 18 100 % $Rem'5\' 2"'wMYo$  (1.575 m) 94.8 kg   Physical Exam Vitals and nursing note reviewed.  Constitutional:      General: She is not in acute distress.    Appearance: Normal appearance. She is not ill-appearing, toxic-appearing or diaphoretic.  HENT:     Head: Normocephalic and atraumatic.  Pulmonary:     Effort: Pulmonary effort is normal.  Neurological:     Mental Status: She is alert  and oriented to person, place, and time.  Psychiatric:        Mood and Affect: Mood normal.        Behavior: Behavior normal.        Thought Content: Thought content normal.        Judgment: Judgment normal.   Results for orders placed or performed during the hospital encounter of 12/22/20 (from the past 24 hour(s))  Wet prep, genital     Status: Abnormal   Collection Time: 12/22/20  2:42 AM  Result Value Ref Range   Yeast Wet Prep HPF POC NONE SEEN NONE SEEN   Trich, Wet Prep NONE SEEN NONE SEEN   Clue Cells Wet Prep HPF POC NONE SEEN NONE SEEN   WBC, Wet Prep HPF POC MANY (A) NONE SEEN   Sperm NONE SEEN   CBC     Status: Abnormal   Collection Time: 12/22/20  3:02 AM  Result Value Ref Range   WBC 9.5 4.0 - 10.5 K/uL   RBC 3.89 3.87 - 5.11 MIL/uL   Hemoglobin 12.3 12.0 - 15.0 g/dL   HCT 35.7 (L) 36.0 - 46.0 %   MCV 91.8 80.0 - 100.0 fL   MCH 31.6 26.0 - 34.0 pg   MCHC 34.5 30.0 - 36.0 g/dL   RDW 12.2 11.5 - 15.5 %   Platelets 197 150 - 400 K/uL   nRBC 0.0 0.0 - 0.2 %  Comprehensive metabolic panel     Status: Abnormal   Collection Time: 12/22/20  3:02 AM  Result Value Ref Range   Sodium 135 135 - 145 mmol/L   Potassium 3.4 (L) 3.5 - 5.1 mmol/L   Chloride 105 98 - 111 mmol/L   CO2 23 22 - 32 mmol/L   Glucose, Bld 90 70 - 99 mg/dL   BUN 8 6 - 20 mg/dL   Creatinine, Ser 0.68 0.44 - 1.00 mg/dL   Calcium 8.5 (L) 8.9 - 10.3 mg/dL   Total Protein 5.8 (L) 6.5 - 8.1 g/dL   Albumin 2.8 (L) 3.5 - 5.0 g/dL   AST 14 (L) 15 - 41 U/L   ALT 16 0 - 44 U/L   Alkaline Phosphatase 44 38 - 126 U/L   Total Bilirubin 0.4 0.3 - 1.2 mg/dL   GFR, Estimated >60 >60 mL/min   Anion gap 7 5 - 15   US OB Limited  Result Date: 12/03/2020 ----------------------------------------------------------------------  OBSTETRICS REPORT                       (  Signed Final 12/03/2020 03:34 pm) ---------------------------------------------------------------------- Patient Info  ID #:       [id number]                           D.O.B.:  1990/05/15 (30 yrs)  Name:       Cheryl Fischer                      Visit Date: 11/28/2020 09:26 am ---------------------------------------------------------------------- Performed By  Attending:        Verita Schneiders         Ref. Address:     [place], [place]  Performed By:     Elyn Peers RN         Location:         [facility]                                                             [facility]  Referred By:      Osborne Oman MD ---------------------------------------------------------------------- Orders  #  Description                           Code  Ordered By  1  US OB LIMITED                         54008.6     Verita Schneiders ----------------------------------------------------------------------  #  Order #                     Accession #                Episode #  1  [id number]                   [id number]                 [id number] ---------------------------------------------------------------------- Indications  [redacted] weeks gestation of pregnancy                Z3A.11 ---------------------------------------------------------------------- Fetal Evaluation  Num Of Fetuses:         1  Preg. Location:         Intrauterine  Fetal Heart Rate(bpm):  166  Cardiac Activity:       Observed ---------------------------------------------------------------------- Biometry  CRL:      46.2  mm     G. Age:  27w 2d                  EDD:   06/17/21 ---------------------------------------------------------------------- Gestational Age  Best:          11w 2d     Det. By:  U/S C R L (11/28/20)     EDD:   06/17/21  ---------------------------------------------------------------------- Comments  Single live IUP at [redacted]w[redacted]d by [place]. Measurements are  consistent with LMP 09/10/20 provided. Per Dr. Harolyn Rutherford  patient to be referred to MFM for consult due to HX of  stillbirth, uterine rupture, and hx  x 6 c-section. ---------------------------------------------------------------------- Impression  Viable single intrauterine pregnancy. ---------------------------------------------------------------------- Recommendations  Continue prenatal care and follow up with MFM as  recommended. ----------------------------------------------------------------------               Verita Schneiders, MD Electronically Signed Final Report   12/03/2020 03:34 pm ----------------------------------------------------------------------  [place] MFM OB 11-14 WEEK ANATOMY  Result Date: 12/02/2020 ----------------------------------------------------------------------  OBSTETRICS REPORT                       (Signed Final 12/02/2020 01:36 pm) ---------------------------------------------------------------------- Patient Info  ID #:       [id number]                          D.O.B.:  1990/09/28 (30 yrs)  Name:       Cheryl Fischer                      Visit Date: 12/02/2020 10:56 am ---------------------------------------------------------------------- Performed By  Attending:        Sander Nephew      Ref. Address:     [address]  [place], [place]  Performed By:     Jacob Moores BS,       Location[facility]         [facility]                    RDMS, RVT                                [facility]                                                             [place] [facility]                                                             [facility]  Referred By:       Osborne Oman MD ---------------------------------------------------------------------- Orders  #  Description                           Code        Ordered By  1  [place] MFM OB 11-14 WEEK                  [id number]      UGONNA ANYANWU     ANATOMY ----------------------------------------------------------------------  #  Order #                     Accession #                Episode #  1  [id number]                   [id number]                 [id number] ---------------------------------------------------------------------- Indications  [redacted] weeks gestation of pregnancy                Z3A.11  Poor obstetric history: Previous gestational   O09.299  diabetes  History of cesarean delivery, currently        O34.219  pregnant  Medical complication of pregnancy (Hx          O26.90  uterine rupture, Hx Cardia Arrest, Hx  Respiratory Failure, Hx Hemorrhagic shock)  Obesity complicating pregnancy, first          O99.211  trimester (BMI 37)  Elevated blood pressure affecting pregnancy    O13.1  in first trimester  Encounter for antenatal screening for          Z36.3  malformations ---------------------------------------------------------------------- Fetal Evaluation  Num Of Fetuses:         1  Preg. Location:         Intrauterine  Gest. Sac:              Intrauterine  Yolk Sac:               Visualized  Fetal Pole:             Visualized  Fetal Heart Rate(bpm):  164  Cardiac Activity:       Observed  Presentation:           Variable  Placenta:               Anterior  P. Cord Insertion:      Not well visualized  Amniotic Fluid  AFI FV:      Within normal limits ---------------------------------------------------------------------- Biometry  CRL:      54.6  mm     G. Age:  12w 0d                  EDD:   06/16/21  NT:       1.12  mm ---------------------------------------------------------------------- Gestational Age  Best:          11w 6d     Det. By:  U/S C R L  (11/28/20)    EDD:   06/17/21  ---------------------------------------------------------------------- Anatomy  Cranium:               Visualized             Abdominal Wall:         Visualized  Choroid Plexus:        Visualized             Bladder:                Visualized  Heart:                 Visualized             Spine:                  Visualized  Diaphragm:             Visualized             Upper Extremities:      Visualized  Stomach:               Visualized             Lower Extremities:      Visualized  Abdomen:               Visualized  Other:  Technically difficult due to early gestational age. ---------------------------------------------------------------------- Cervix Uterus Adnexa  Cervix  Length:           3.87  cm.  Normal appearance by transabdominal scan.  Right Ovary  Within normal limits. Small corpus luteum noted.  Left Ovary  Within normal limits.  Cul De Sac  No free fluid seen.  Adnexa  No abnormality visualized. ---------------------------------------------------------------------- Impression  Single intrauterine pregnancy with measurements consistent  with dates.  The nuchal measurement today was 1.12 mm  The first trimester anatomy was normal, however, a detailed  anatomy was precluded due to early gestational age.  Therefore we have scheduled Ms. Rudder  to return in 6-8  weeks for an anatomical survey.  MFM Consult performed today secondary to history of uterine  rupture. Please see report  for details. ---------------------------------------------------------------------- Recommendations  Detailed anatomy in 6-8 weeks. ----------------------------------------------------------------------               Sander Nephew, MD Electronically Signed Final Report   12/02/2020 01:36 pm ----------------------------------------------------------------------   MAU Course  Procedures  MDM -CBC: WNL -CMP: K 3.4 -[place]: [place] x2 -ABO: O Positive -WetPrep: WNL -GC/CT collected -pt discharged to home in stable  condition  Orders Placed This Encounter  Procedures   Wet prep, genital    Standing Status:   Standing    Number of Occurrences:   1   [place] MFM OB LIMITED    Standing Status:   Standing    Number of Occurrences:   1    Order Specific Question:   Symptom/Reason for Exam    Answer:   Vaginal bleeding affecting early pregnancy [[id number]]   CBC    Standing Status:   Standing    Number of Occurrences:   1   Comprehensive metabolic panel    Standing Status:   Standing    Number of Occurrences:   1   Discharge patient    Order Specific Question:   Discharge disposition    Answer:   01-Home or Self Care [1]    Order Specific Question:   Discharge patient date    Answer:   12/22/2020   No orders of the defined types were placed in this encounter.  Assessment and Plan   1. Subchorionic hemorrhage of placenta in second trimester, single or unspecified fetus   2. Vaginal bleeding affecting early pregnancy   3. Blood type, Rh positive    Allergies as of 12/22/2020       Reactions   Penicillins Hives, Shortness Of Breath, Swelling   Did it involve swelling of the face/tongue/throat, SOB, or low BP? Yes Did it involve sudden or severe rash/hives, skin peeling, or any reaction on the inside of your mouth or nose? Yes Did you need to seek medical attention at a hospital or doctor's office? Unknown When did it last happen? Childhood reaction      If all above answers are "NO", may proceed with cephalosporin use.        Medication List     TAKE these medications    Blood Pressure Kit Devi 1 kit by Does not apply route once a week.   Gojji Weight Scale Misc 1 Device by Does not apply route every 30 (thirty) days.   multivitamin-prenatal 27-0.8 MG Tabs tablet Take 1 tablet by mouth daily at 12 noon.         -pt advised must keep NOB appt today -discussed eating foods high in postassium -discussed expected s/sx of [facility] -return MAU precautions given -pt discharged to home in  stable condition  Elmyra Ricks E Daysi Boggan 12/22/2020, 4:15 AM

## 2020-12-23 LAB — OBSTETRIC PANEL, INCLUDING HIV
Antibody Screen: NEGATIVE
Basophils Absolute: 0 10*3/uL (ref 0.0–0.2)
Basos: 0 %
EOS (ABSOLUTE): 0.1 10*3/uL (ref 0.0–0.4)
Eos: 1 %
HIV Screen 4th Generation wRfx: NONREACTIVE
Hematocrit: 35.8 % (ref 34.0–46.6)
Hemoglobin: 12.7 g/dL (ref 11.1–15.9)
Hepatitis B Surface Ag: NEGATIVE
Immature Grans (Abs): 0 10*3/uL (ref 0.0–0.1)
Immature Granulocytes: 0 %
Lymphocytes Absolute: 1.6 10*3/uL (ref 0.7–3.1)
Lymphs: 18 %
MCH: 32.1 pg (ref 26.6–33.0)
MCHC: 35.5 g/dL (ref 31.5–35.7)
MCV: 90 fL (ref 79–97)
Monocytes Absolute: 0.4 10*3/uL (ref 0.1–0.9)
Monocytes: 5 %
Neutrophils Absolute: 6.6 10*3/uL (ref 1.4–7.0)
Neutrophils: 76 %
Platelets: 205 10*3/uL (ref 150–450)
RBC: 3.96 x10E6/uL (ref 3.77–5.28)
RDW: 12.2 % (ref 11.7–15.4)
RPR Ser Ql: NONREACTIVE
Rh Factor: POSITIVE
Rubella Antibodies, IGG: 1.69 index (ref >0.99)
WBC: 8.8 10*3/uL (ref 3.4–10.8)

## 2020-12-23 LAB — HEMOGLOBIN A1C
Est. average glucose Bld gHb Est-mCnc: 91 mg/dL
Hgb A1c MFr Bld: 4.8 % (ref 4.8–5.6)

## 2020-12-23 LAB — HEPATITIS C ANTIBODY: Hep C Virus Ab: <0.1 s/co ratio (ref 0.0–0.9)

## 2020-12-24 LAB — CYTOLOGY - PAP
Comment: NEGATIVE
Diagnosis: NEGATIVE
High risk HPV: NEGATIVE

## 2020-12-30 ENCOUNTER — Encounter: Payer: Self-pay | Admitting: Obstetrics and Gynecology

## 2021-01-19 ENCOUNTER — Encounter: Payer: Self-pay | Admitting: Obstetrics and Gynecology

## 2021-01-19 ENCOUNTER — Other Ambulatory Visit: Payer: Self-pay

## 2021-01-19 ENCOUNTER — Ambulatory Visit (INDEPENDENT_AMBULATORY_CARE_PROVIDER_SITE_OTHER): Payer: Medicaid Other | Admitting: Obstetrics and Gynecology

## 2021-01-19 VITALS — BP 111/75 | HR 70 | Wt 216.8 lb

## 2021-01-19 DIAGNOSIS — O99210 Obesity complicating pregnancy, unspecified trimester: Secondary | ICD-10-CM

## 2021-01-19 DIAGNOSIS — O09292 Supervision of pregnancy with other poor reproductive or obstetric history, second trimester: Secondary | ICD-10-CM

## 2021-01-19 DIAGNOSIS — Z23 Encounter for immunization: Secondary | ICD-10-CM

## 2021-01-19 DIAGNOSIS — O0992 Supervision of high risk pregnancy, unspecified, second trimester: Secondary | ICD-10-CM

## 2021-01-19 DIAGNOSIS — Z8632 Personal history of gestational diabetes: Secondary | ICD-10-CM

## 2021-01-19 DIAGNOSIS — Z3A18 18 weeks gestation of pregnancy: Secondary | ICD-10-CM

## 2021-01-19 DIAGNOSIS — O34219 Maternal care for unspecified type scar from previous cesarean delivery: Secondary | ICD-10-CM

## 2021-01-19 DIAGNOSIS — O0990 Supervision of high risk pregnancy, unspecified, unspecified trimester: Secondary | ICD-10-CM

## 2021-01-19 DIAGNOSIS — O99212 Obesity complicating pregnancy, second trimester: Secondary | ICD-10-CM

## 2021-01-19 DIAGNOSIS — O09299 Supervision of pregnancy with other poor reproductive or obstetric history, unspecified trimester: Secondary | ICD-10-CM

## 2021-01-19 NOTE — Progress Notes (Signed)
Pt presents for ROB and AFP.  

## 2021-01-19 NOTE — Progress Notes (Signed)
   PRENATAL VISIT NOTE  Subjective:  Cheryl Fischer is a 30 y.o. [id number] at [redacted]w[redacted]d being seen today for ongoing prenatal care.  She is currently monitored for the following issues for this high-risk pregnancy and has Supervision of other normal pregnancy, antepartum; History of cesarean delivery, currently pregnant; H/O multiple gestation; Elevated blood pressure reading without diagnosis of hypertension; Obesity during pregnancy; Gestational diabetes mellitus (GDM); Hemorrhagic shock (HCC); Uterine rupture; Neonatal death; Acute blood loss anemia; Hypoxia; Respiratory failure (HCC); Cardiac arrest (HCC); Status post cesarean section; Encounter for postoperative wound care; Supervision of high risk pregnancy, antepartum; History of gestational diabetes in prior pregnancy, currently pregnant; and Umbilical hernia on their problem list.  Patient reports no complaints.  Contractions: Not present. Vag. Bleeding: None.  Movement: Present. Denies leaking of fluid.   The following portions of the patient's history were reviewed and updated as appropriate: allergies, current medications, past family history, past medical history, past social history, past surgical history and problem list.   Objective:   Vitals:   01/19/21 1037  BP: 111/75  Pulse: 70  Weight: 216 lb 12.8 oz (98.3 kg)    Fetal Status: Fetal Heart Rate (bpm): 153   Movement: Present     General:  Alert, oriented and cooperative. Patient is in no acute distress.  Skin: Skin is warm and dry. No rash noted.   Cardiovascular: Normal heart rate noted  Respiratory: Normal respiratory effort, no problems with respiration noted  Abdomen: Soft, gravid, appropriate for gestational age.  Pain/Pressure: Absent     Pelvic: Cervical exam deferred        Extremities: Normal range of motion.  Edema: None  Mental Status: Normal mood and affect. Normal behavior. Normal judgment and thought content.   Assessment and Plan:  Pregnancy: [id number] at  [redacted]w[redacted]d 1. Supervision of high risk pregnancy, antepartum Patient is doing well without complaints AFP today Anatomy scheduled tomorrow  2. History of gestational diabetes in prior pregnancy, currently pregnant Normal early screening   3. Obesity during pregnancy Continue ASA  4. History of cesarean delivery, currently pregnant Will be scheduled for repeat with BTL  Preterm labor symptoms and general obstetric precautions including but not limited to vaginal bleeding, contractions, leaking of fluid and fetal movement were reviewed in detail with the patient. Please refer to After Visit Summary for other counseling recommendations.   Return in about 4 weeks (around 02/16/2021) for in person, ROB, High risk.  Future Appointments  Date Time Provider Department Center  01/20/2021 10:30 AM [facility] NURSE [facility]  01/20/2021 10:45 AM [facility] US5 [facility]    Catalina Antigua, MD

## 2021-01-20 ENCOUNTER — Encounter: Payer: Self-pay | Admitting: *Deleted

## 2021-01-20 ENCOUNTER — Ambulatory Visit: Payer: Medicaid Other | Attending: Maternal & Fetal Medicine

## 2021-01-20 ENCOUNTER — Other Ambulatory Visit: Payer: Self-pay | Admitting: *Deleted

## 2021-01-20 ENCOUNTER — Ambulatory Visit: Payer: Medicaid Other

## 2021-01-20 ENCOUNTER — Ambulatory Visit: Payer: Medicaid Other | Admitting: *Deleted

## 2021-01-20 VITALS — BP 115/67 | HR 77

## 2021-01-20 DIAGNOSIS — Z6832 Body mass index (BMI) 32.0-32.9, adult: Secondary | ICD-10-CM | POA: Insufficient documentation

## 2021-01-20 DIAGNOSIS — O0990 Supervision of high risk pregnancy, unspecified, unspecified trimester: Secondary | ICD-10-CM

## 2021-01-20 DIAGNOSIS — O09292 Supervision of pregnancy with other poor reproductive or obstetric history, second trimester: Secondary | ICD-10-CM

## 2021-01-20 DIAGNOSIS — Z8674 Personal history of sudden cardiac arrest: Secondary | ICD-10-CM | POA: Diagnosis not present

## 2021-01-20 DIAGNOSIS — Z8632 Personal history of gestational diabetes: Secondary | ICD-10-CM | POA: Diagnosis not present

## 2021-01-20 DIAGNOSIS — Z87828 Personal history of other (healed) physical injury and trauma: Secondary | ICD-10-CM

## 2021-01-20 DIAGNOSIS — O99212 Obesity complicating pregnancy, second trimester: Secondary | ICD-10-CM

## 2021-01-20 DIAGNOSIS — O34219 Maternal care for unspecified type scar from previous cesarean delivery: Secondary | ICD-10-CM | POA: Insufficient documentation

## 2021-01-20 DIAGNOSIS — E669 Obesity, unspecified: Secondary | ICD-10-CM

## 2021-01-20 DIAGNOSIS — Z3A18 18 weeks gestation of pregnancy: Secondary | ICD-10-CM

## 2021-01-20 DIAGNOSIS — O09299 Supervision of pregnancy with other poor reproductive or obstetric history, unspecified trimester: Secondary | ICD-10-CM

## 2021-01-21 LAB — AFP, SERUM, OPEN SPINA BIFIDA
AFP MoM: 1.93
AFP Value: 83.4 ng/mL
Gest. Age on Collection Date: 18.5 weeks
Maternal Age At EDD: 30.5 yr
OSBR Risk 1 IN: 1886
Test Results:: NEGATIVE
Weight: 216 [lb_av]

## 2021-02-17 ENCOUNTER — Encounter: Payer: Medicaid Other | Admitting: Family Medicine

## 2021-02-18 ENCOUNTER — Other Ambulatory Visit: Payer: Self-pay

## 2021-02-18 ENCOUNTER — Encounter: Payer: Self-pay | Admitting: Obstetrics and Gynecology

## 2021-02-18 ENCOUNTER — Ambulatory Visit (INDEPENDENT_AMBULATORY_CARE_PROVIDER_SITE_OTHER): Payer: Medicaid Other | Admitting: Obstetrics and Gynecology

## 2021-02-18 VITALS — BP 113/72 | HR 84 | Wt 223.0 lb

## 2021-02-18 DIAGNOSIS — Z3480 Encounter for supervision of other normal pregnancy, unspecified trimester: Secondary | ICD-10-CM | POA: Diagnosis not present

## 2021-02-18 DIAGNOSIS — O99210 Obesity complicating pregnancy, unspecified trimester: Secondary | ICD-10-CM

## 2021-02-18 DIAGNOSIS — Z8632 Personal history of gestational diabetes: Secondary | ICD-10-CM

## 2021-02-18 DIAGNOSIS — O09299 Supervision of pregnancy with other poor reproductive or obstetric history, unspecified trimester: Secondary | ICD-10-CM

## 2021-02-18 DIAGNOSIS — R030 Elevated blood-pressure reading, without diagnosis of hypertension: Secondary | ICD-10-CM

## 2021-02-18 DIAGNOSIS — O0990 Supervision of high risk pregnancy, unspecified, unspecified trimester: Secondary | ICD-10-CM

## 2021-02-18 DIAGNOSIS — O34219 Maternal care for unspecified type scar from previous cesarean delivery: Secondary | ICD-10-CM

## 2021-02-18 DIAGNOSIS — K429 Umbilical hernia without obstruction or gangrene: Secondary | ICD-10-CM

## 2021-02-18 DIAGNOSIS — O283 Abnormal ultrasonic finding on antenatal screening of mother: Secondary | ICD-10-CM | POA: Insufficient documentation

## 2021-02-18 DIAGNOSIS — Z6841 Body Mass Index (BMI) 40.0 and over, adult: Secondary | ICD-10-CM

## 2021-02-18 DIAGNOSIS — Z3A23 23 weeks gestation of pregnancy: Secondary | ICD-10-CM

## 2021-02-18 DIAGNOSIS — K439 Ventral hernia without obstruction or gangrene: Secondary | ICD-10-CM

## 2021-02-18 DIAGNOSIS — Z8674 Personal history of sudden cardiac arrest: Secondary | ICD-10-CM

## 2021-02-18 DIAGNOSIS — S3769XD Other injury of uterus, subsequent encounter: Secondary | ICD-10-CM

## 2021-02-18 DIAGNOSIS — K219 Gastro-esophageal reflux disease without esophagitis: Secondary | ICD-10-CM

## 2021-02-18 MED ORDER — FAMOTIDINE 20 MG PO TABS: 20.0000 mg | ORAL_TABLET | Freq: Two times a day (BID) | ORAL | 3 refills | Status: DC

## 2021-02-18 NOTE — Progress Notes (Signed)
° °  PRENATAL VISIT NOTE  Subjective:  Cheryl Fischer is a 31 y.o. [id number] at [redacted]w[redacted]d being seen today for ongoing prenatal care.  She is currently monitored for the following issues for this high-risk pregnancy and has History of cesarean delivery, currently pregnant; Elevated blood pressure reading without diagnosis of hypertension; Obesity during pregnancy; Hemorrhagic shock ([place]); History of uterine rupture; Neonatal death; History of cardiac arrest; Supervision of high risk pregnancy, antepartum; History of gestational diabetes in prior pregnancy, currently pregnant; Supraumbilical hernia; Absent fetal nasal bone; and BMI 40.0-44.9, adult ([place]) on their problem list.  Patient reports  gerd .  Contractions: Not present. Vag. Bleeding: None.  Movement: Present. Denies leaking of fluid.   The following portions of the patient's history were reviewed and updated as appropriate: allergies, current medications, past family history, past medical history, past social history, past surgical history and problem list.   Objective:   Vitals:   02/18/21 0824  BP: 113/72  Pulse: 84  Weight: 223 lb (101.2 kg)    Fetal Status: Fetal Heart Rate (bpm): 151   Movement: Present     General:  Alert, oriented and cooperative. Patient is in no acute distress.  Skin: Skin is warm and dry. No rash noted.   Cardiovascular: Normal heart rate noted  Respiratory: Normal respiratory effort, no problems with respiration noted  Abdomen: At least 8cm supraumbilical hernia, nttp, easilly reducible Soft, gravid, appropriate for gestational age.    Pain/Pressure: Absent     Pelvic: Cervical exam deferred        Extremities: Normal range of motion.  Edema: None  Mental Status: Normal mood and affect. Normal behavior. Normal judgment and thought content.   Assessment and Plan:  Pregnancy: [id number] at [redacted]w[redacted]d 1. Supervision of high risk pregnancy, antepartum I d/w her re: BTL and that I recommend this and operative procedure  d/w her. BTL papers signed today Pepcid sent in  2. History of gestational diabetes in prior pregnancy, currently pregnant GTT nv; early a1c neg  3. [redacted] weeks gestation of pregnancy  4. Rupture of uterus, subsequent encounter Schedule rpt and btl at 36wks Mfm to assess lower uterine segment each u/s  5. Elevated blood pressure reading without diagnosis of hypertension Wnl today  6. Obesity during pregnancy Nearly 48lbs total weight gain; d/w pt more each visit  7. BMI 40.0-44.9, adult ([place])  8. Absent fetal nasal bone No current issues  9. History of cesarean delivery, currently pregnant Fundal placenta. See above  10. Supraumbilical hernia ED precautions given. Referred to [facility] surgery for PP management - Ambulatory referral to General Surgery  11. History of cardiac arrest 09/2018 due to hypovolemic shock from uterine rupture; see above  12. Neonatal death See above  13. Gastroesophageal reflux disease, unspecified whether esophagitis present  Preterm labor symptoms and general obstetric precautions including but not limited to vaginal bleeding, contractions, leaking of fluid and fetal movement were reviewed in detail with the patient. Please refer to After Visit Summary for other counseling recommendations.   Return in about 3 weeks (around 03/11/2021) for in person, fasting 2hr GTT, high risk ob, md visit.  Future Appointments  Date Time Provider [place]  02/20/2021 10:45 AM [facility] NURSE [facility]  02/20/2021 11:00 AM [facility] US1 [facility]    Aletha Halim, MD

## 2021-02-18 NOTE — Progress Notes (Signed)
ROB 23 wks Reports bad heartburn, tums help some but would like something better.

## 2021-02-20 ENCOUNTER — Encounter: Payer: Self-pay | Admitting: *Deleted

## 2021-02-20 ENCOUNTER — Other Ambulatory Visit: Payer: Self-pay | Admitting: *Deleted

## 2021-02-20 ENCOUNTER — Ambulatory Visit: Payer: Medicaid Other | Attending: Obstetrics and Gynecology

## 2021-02-20 ENCOUNTER — Other Ambulatory Visit: Payer: Self-pay

## 2021-02-20 ENCOUNTER — Ambulatory Visit: Payer: Medicaid Other | Admitting: *Deleted

## 2021-02-20 VITALS — BP 111/63 | HR 67

## 2021-02-20 DIAGNOSIS — O99212 Obesity complicating pregnancy, second trimester: Secondary | ICD-10-CM

## 2021-02-20 DIAGNOSIS — O0990 Supervision of high risk pregnancy, unspecified, unspecified trimester: Secondary | ICD-10-CM

## 2021-02-20 DIAGNOSIS — O09299 Supervision of pregnancy with other poor reproductive or obstetric history, unspecified trimester: Secondary | ICD-10-CM

## 2021-02-20 DIAGNOSIS — O34219 Maternal care for unspecified type scar from previous cesarean delivery: Secondary | ICD-10-CM | POA: Diagnosis present

## 2021-02-20 DIAGNOSIS — Z3A23 23 weeks gestation of pregnancy: Secondary | ICD-10-CM

## 2021-02-20 DIAGNOSIS — Z8632 Personal history of gestational diabetes: Secondary | ICD-10-CM | POA: Diagnosis present

## 2021-02-20 DIAGNOSIS — Z362 Encounter for other antenatal screening follow-up: Secondary | ICD-10-CM

## 2021-02-20 DIAGNOSIS — Z87828 Personal history of other (healed) physical injury and trauma: Secondary | ICD-10-CM

## 2021-02-20 DIAGNOSIS — Q758 Other specified congenital malformations of skull and face bones: Secondary | ICD-10-CM

## 2021-02-22 ENCOUNTER — Inpatient Hospital Stay (HOSPITAL_COMMUNITY)
Admission: AD | Admit: 2021-02-22 | Discharge: 2021-02-23 | Disposition: A | Discharge status: Home / Self Care | Payer: Medicaid Other | Attending: Obstetrics and Gynecology | Admitting: Obstetrics and Gynecology

## 2021-02-22 ENCOUNTER — Encounter (HOSPITAL_COMMUNITY): Payer: Self-pay | Admitting: Obstetrics and Gynecology

## 2021-02-22 DIAGNOSIS — O4692 Antepartum hemorrhage, unspecified, second trimester: Secondary | ICD-10-CM | POA: Diagnosis not present

## 2021-02-22 DIAGNOSIS — O09299 Supervision of pregnancy with other poor reproductive or obstetric history, unspecified trimester: Secondary | ICD-10-CM

## 2021-02-22 DIAGNOSIS — Z3A23 23 weeks gestation of pregnancy: Secondary | ICD-10-CM | POA: Diagnosis not present

## 2021-02-22 DIAGNOSIS — O0990 Supervision of high risk pregnancy, unspecified, unspecified trimester: Secondary | ICD-10-CM

## 2021-02-22 NOTE — MAU Provider Note (Signed)
History     CSN: [id number]  Arrival date and time: 02/22/21 2256   Event Date/Time   First Provider Initiated Contact with Patient 02/22/21 2357      Chief Complaint  Patient presents with   Vaginal Bleeding   HPI Cheryl Fischer is a 31 y.o. [id number] at [redacted]w[redacted]d who presents via EMS for vaginal bleeding. States at 9 pm she noticed pink spotting on toilet paper. Bleeding has not continued. Denies abdominal pain, vaginal discharge, vaginal irritation. No recent intercourse. Reports good fetal movement.   OB History     Gravida  9   Para  8   Term  7   Preterm  1   AB      Living  7      SAB      IAB      Ectopic      Multiple  2   Live Births  7        Obstetric Comments  Twins         Past Medical History:  Diagnosis Date   Anemia    Blood transfusion without reported diagnosis    Gestational diabetes mellitus (GDM) 08/21/2018   Hernia of abdominal wall    Respiratory failure (HCC)    Vaginal Pap smear, abnormal     Past Surgical History:  Procedure Laterality Date   CESAREAN SECTION  05/01/2010   CESAREAN SECTION N/A 10/12/2018   Procedure: CESAREAN SECTION;  Surgeon: Reva Bores, MD;  Location: [facility];  Service: Obstetrics;  Laterality: N/A;   CESAREAN SECTION  06/24/2011   CESAREAN SECTION  12/27/2014   CESAREAN SECTION  03/12/2017   CESAREAN SECTION  12/29/2015    Family History  Problem Relation Age of Onset   Diabetes Maternal Grandmother    Cancer Paternal Grandmother     Social History   Tobacco Use   Smoking status: Never   Smokeless tobacco: Never  Vaping Use   Vaping Use: Never used  Substance Use Topics   Alcohol use: Never   Drug use: Never    Allergies:  Allergies  Allergen Reactions   Penicillins Hives, Shortness Of Breath and Swelling    Did it involve swelling of the face/tongue/throat, SOB, or Fischer BP? Yes Did it involve sudden or severe rash/hives, skin peeling, or any reaction on the inside of your mouth or  nose? Yes Did you need to seek medical attention at a hospital or doctor's office? Unknown When did it last happen? Childhood reaction      If all above answers are NO, may proceed with cephalosporin use.     Medications Prior to Admission  Medication Sig Dispense Refill Last Dose   aspirin EC 81 MG tablet Take 1 tablet (81 mg total) by mouth daily. Take after 12 weeks for prevention of preeclampsia later in pregnancy 300 tablet 2 02/22/2021 at 0700   Prenatal Vit-Fe Fumarate-FA (PRENATAL VITAMIN PO) Take by mouth.   02/22/2021 at 0700   famotidine (PEPCID) 20 MG tablet Take 1 tablet (20 mg total) by mouth 2 (two) times daily. 60 tablet 3     Review of Systems  Constitutional: Negative.   Gastrointestinal: Negative.   Genitourinary:  Positive for vaginal bleeding. Negative for dysuria and vaginal discharge.  Physical Exam   Blood pressure 127/79, pulse 86, resp. rate 16, last menstrual period 09/10/2020, SpO2 98 %.  Physical Exam Vitals and nursing note reviewed. Exam conducted with a chaperone present.  Constitutional:  General: She is not in acute distress.    Appearance: Normal appearance.  HENT:     Head: Normocephalic and atraumatic.  Eyes:     General: No scleral icterus.    Conjunctiva/sclera: Conjunctivae normal.  Pulmonary:     Effort: Pulmonary effort is normal. No respiratory distress.  Abdominal:     Palpations: Abdomen is soft.     Tenderness: There is no abdominal tenderness.  Genitourinary:    Comments: Pelvic: NEFG, scan bright red blood coming from superficial abrasion of left vaginal wall. Cervix pink/smooth/not friable. White mucoid discharge.  Skin:    General: Skin is warm and dry.  Neurological:     General: No focal deficit present.     Mental Status: She is alert.  Psychiatric:        Mood and Affect: Mood normal.        Behavior: Behavior normal.   NST:  Baseline: 150 bpm, Variability: Good {> 6 bpm), Accelerations: Non-reactive but  appropriate for gestational age, and Decelerations: Absent  MAU Course  Procedures Results for orders placed or performed during the hospital encounter of 02/22/21 (from the past 24 hour(s))  Wet prep, genital     Status: Abnormal   Collection Time: 02/23/21 12:28 AM   Specimen: Vaginal  Result Value Ref Range   Yeast Wet Prep HPF POC NONE SEEN NONE SEEN   Trich, Wet Prep NONE SEEN NONE SEEN   Clue Cells Wet Prep HPF POC PRESENT (A) NONE SEEN   WBC, Wet Prep HPF POC >=10 (A) <10   Sperm NONE SEEN     MDM Patient presents with report of pink spotting. On exam there is no active bleeding. There as an area on her left vaginal wall near the introitus that has a small abrasion with scant bright red blood.  Cervix is visually closed.  She is RH positive.  Fetal tracing appropriate for gestational age  Wet prep & GC/CT collected. Wet prep positive for clue cells. No abnormal discharge on exam. Doesn't meet amsel criteria for BV.   Assessment and Plan   1. Vaginal bleeding in pregnancy, second trimester   2. [redacted] weeks gestation of pregnancy    -reviewed reasons to return to MAU -GC/CT pending  Judeth Horn 02/23/2021, 2:04 AM

## 2021-02-22 NOTE — MAU Note (Addendum)
Pt verbalized that spotting began at 9pm and she soon called EMS. Color was pink and a scant amount. Reports no blood clots or soaking of pads. Denies any recent intercourse. She reports some mucous like vomiting after spotting began. At this time reports pain 0/10, denies n/v, denies PIH s/s, and scant pinkish-spotting continues. Does report previous uterine rupture and stillbirth at 35 weeks with previous pregnancy in 2020 .VS are stable at 127/79, 98% on RA, temp 98.2, RR 18.

## 2021-02-22 NOTE — MAU Note (Incomplete)
Cheryl Fischer is a 31 y.o. at [redacted]w[redacted]d here in MAU reporting: Spotting that started at 9pm. Has noticed some bruising recently also. No pain. LMP: *** Onset of complaint: 9pm Pain score: 0/10  There were no vitals filed for this visit.   FHT:*** Lab orders placed from triage:

## 2021-02-23 DIAGNOSIS — Z3A23 23 weeks gestation of pregnancy: Secondary | ICD-10-CM

## 2021-02-23 DIAGNOSIS — O4692 Antepartum hemorrhage, unspecified, second trimester: Secondary | ICD-10-CM

## 2021-02-23 LAB — WET PREP, GENITAL
Sperm: NONE SEEN
Trich, Wet Prep: NONE SEEN
WBC, Wet Prep HPF POC: >=10 — AB (ref <10)
Yeast Wet Prep HPF POC: NONE SEEN

## 2021-02-23 LAB — GC/CHLAMYDIA PROBE AMP (~~LOC~~) NOT AT ARMC
Chlamydia: NEGATIVE
Comment: NEGATIVE
Comment: NORMAL
Neisseria Gonorrhea: NEGATIVE

## 2021-03-11 ENCOUNTER — Other Ambulatory Visit: Payer: Medicaid Other

## 2021-03-11 ENCOUNTER — Ambulatory Visit (INDEPENDENT_AMBULATORY_CARE_PROVIDER_SITE_OTHER): Payer: Medicaid Other | Admitting: Obstetrics and Gynecology

## 2021-03-11 ENCOUNTER — Other Ambulatory Visit: Payer: Self-pay

## 2021-03-11 VITALS — BP 125/83 | HR 67 | Wt 231.0 lb

## 2021-03-11 DIAGNOSIS — O34219 Maternal care for unspecified type scar from previous cesarean delivery: Secondary | ICD-10-CM

## 2021-03-11 DIAGNOSIS — Z8674 Personal history of sudden cardiac arrest: Secondary | ICD-10-CM

## 2021-03-11 DIAGNOSIS — S3769XS Other injury of uterus, sequela: Secondary | ICD-10-CM

## 2021-03-11 DIAGNOSIS — O99210 Obesity complicating pregnancy, unspecified trimester: Secondary | ICD-10-CM

## 2021-03-11 DIAGNOSIS — Z6841 Body Mass Index (BMI) 40.0 and over, adult: Secondary | ICD-10-CM

## 2021-03-11 DIAGNOSIS — O09299 Supervision of pregnancy with other poor reproductive or obstetric history, unspecified trimester: Secondary | ICD-10-CM

## 2021-03-11 DIAGNOSIS — Z8632 Personal history of gestational diabetes: Secondary | ICD-10-CM

## 2021-03-11 DIAGNOSIS — O0990 Supervision of high risk pregnancy, unspecified, unspecified trimester: Secondary | ICD-10-CM

## 2021-03-11 DIAGNOSIS — Z3A26 26 weeks gestation of pregnancy: Secondary | ICD-10-CM | POA: Insufficient documentation

## 2021-03-11 LAB — OB RESULTS CONSOLE GC/CHLAMYDIA: Gonorrhea: NEGATIVE

## 2021-03-11 NOTE — Progress Notes (Signed)
° °  PRENATAL VISIT NOTE  Subjective:  Cheryl Fischer is a 31 y.o. [phone] at [redacted]w[redacted]d being seen today for ongoing prenatal care.  She is currently monitored for the following issues for this high-risk pregnancy and has History of cesarean delivery, currently pregnant; Elevated blood pressure reading without diagnosis of hypertension; Obesity during pregnancy; Hemorrhagic shock ([place]); History of uterine rupture; Neonatal death; History of cardiac arrest; Supervision of high risk pregnancy, antepartum; History of gestational diabetes in prior pregnancy, currently pregnant; Supraumbilical hernia; Absent fetal nasal bone; BMI 40.0-44.9, adult ([place]); and [redacted] weeks gestation of pregnancy on their problem list.  Patient doing well with no acute concerns today. She reports no complaints.  Contractions: Not present. Vag. Bleeding: None.  Movement: Present. Denies leaking of fluid.   The following portions of the patient's history were reviewed and updated as appropriate: allergies, current medications, past family history, past medical history, past social history, past surgical history and problem list. Problem list updated.  Objective:   Vitals:   03/11/21 0907  BP: 125/83  Pulse: 67  Weight: 231 lb (104.8 kg)    Fetal Status: Fetal Heart Rate (bpm): 160   Movement: Present     General:  Alert, oriented and cooperative. Patient is in no acute distress.  Skin: Skin is warm and dry. No rash noted.   Cardiovascular: Normal heart rate noted  Respiratory: Normal respiratory effort, no problems with respiration noted  Abdomen: Soft, gravid, appropriate for gestational age.  Pain/Pressure: Absent     Pelvic: Cervical exam deferred        Extremities: Normal range of motion.     Mental Status:  Normal mood and affect. Normal behavior. Normal judgment and thought content.   Assessment and Plan:  Pregnancy: [id number] at [redacted]w[redacted]d  1. [redacted] weeks gestation of pregnancy   2. Rupture of uterus, sequela Pt is  followed by MFM, per note delivery at 36-37 weeks  3. History of cesarean delivery, currently pregnant Last u/s showed no signs of accreta, follow up scan 03/20/21  4. Obesity during pregnancy   5. BMI 40.0-44.9, adult ([place])   6. History of cardiac arrest   7. Supervision of high risk pregnancy, antepartum Continue routine care.  Consider scheduling repeat c/s and BTL at next visit (28-30 weeks) - Glucose Tolerance, 2 Hours w/1 Hour - CBC - HIV Antibody (routine testing w rflx) - RPR  8. History of gestational diabetes in prior pregnancy, currently pregnant 2 hour GTT today  Preterm labor symptoms and general obstetric precautions including but not limited to vaginal bleeding, contractions, leaking of fluid and fetal movement were reviewed in detail with the patient.  Please refer to After Visit Summary for other counseling recommendations.   Return in about 2 weeks (around 03/25/2021) for [facility], in person.   Lynnda Shields, MD Faculty Attending Center for [facility]

## 2021-03-12 LAB — CBC
Hematocrit: 35 % (ref 34.0–46.6)
Hemoglobin: 12.2 g/dL (ref 11.1–15.9)
MCH: 32.2 pg (ref 26.6–33.0)
MCHC: 34.9 g/dL (ref 31.5–35.7)
MCV: 92 fL (ref 79–97)
Platelets: 202 10*3/uL (ref 150–450)
RBC: 3.79 x10E6/uL (ref 3.77–5.28)
RDW: 12 % (ref 11.7–15.4)
WBC: 9.5 10*3/uL (ref 3.4–10.8)

## 2021-03-12 LAB — RPR: RPR Ser Ql: NONREACTIVE

## 2021-03-12 LAB — GLUCOSE TOLERANCE, 2 HOURS W/ 1HR
Glucose, 1 hour: 111 mg/dL (ref 70–179)
Glucose, 2 hour: 81 mg/dL (ref 70–152)
Glucose, Fasting: 79 mg/dL (ref 70–91)

## 2021-03-12 LAB — HIV ANTIBODY (ROUTINE TESTING W REFLEX): HIV Screen 4th Generation wRfx: NONREACTIVE

## 2021-03-20 ENCOUNTER — Other Ambulatory Visit: Payer: Self-pay

## 2021-03-20 ENCOUNTER — Ambulatory Visit: Payer: Medicaid Other | Admitting: *Deleted

## 2021-03-20 ENCOUNTER — Encounter: Payer: Self-pay | Admitting: *Deleted

## 2021-03-20 ENCOUNTER — Other Ambulatory Visit: Payer: Self-pay | Admitting: *Deleted

## 2021-03-20 ENCOUNTER — Ambulatory Visit: Payer: Medicaid Other | Attending: Obstetrics and Gynecology

## 2021-03-20 VITALS — BP 115/62 | HR 68

## 2021-03-20 DIAGNOSIS — O0990 Supervision of high risk pregnancy, unspecified, unspecified trimester: Secondary | ICD-10-CM | POA: Diagnosis present

## 2021-03-20 DIAGNOSIS — O34219 Maternal care for unspecified type scar from previous cesarean delivery: Secondary | ICD-10-CM | POA: Diagnosis present

## 2021-03-20 DIAGNOSIS — Q758 Other specified congenital malformations of skull and face bones: Secondary | ICD-10-CM | POA: Insufficient documentation

## 2021-03-20 DIAGNOSIS — E668 Other obesity: Secondary | ICD-10-CM

## 2021-03-20 DIAGNOSIS — O24419 Gestational diabetes mellitus in pregnancy, unspecified control: Secondary | ICD-10-CM

## 2021-03-20 DIAGNOSIS — Z8632 Personal history of gestational diabetes: Secondary | ICD-10-CM | POA: Diagnosis present

## 2021-03-20 DIAGNOSIS — O09293 Supervision of pregnancy with other poor reproductive or obstetric history, third trimester: Secondary | ICD-10-CM | POA: Diagnosis not present

## 2021-03-20 DIAGNOSIS — O09299 Supervision of pregnancy with other poor reproductive or obstetric history, unspecified trimester: Secondary | ICD-10-CM | POA: Diagnosis present

## 2021-03-20 DIAGNOSIS — Z3A27 27 weeks gestation of pregnancy: Secondary | ICD-10-CM

## 2021-03-20 DIAGNOSIS — Z87828 Personal history of other (healed) physical injury and trauma: Secondary | ICD-10-CM | POA: Diagnosis present

## 2021-03-20 DIAGNOSIS — O99212 Obesity complicating pregnancy, second trimester: Secondary | ICD-10-CM

## 2021-03-25 ENCOUNTER — Encounter: Payer: Self-pay | Admitting: Obstetrics and Gynecology

## 2021-03-25 ENCOUNTER — Other Ambulatory Visit: Payer: Self-pay

## 2021-03-25 ENCOUNTER — Ambulatory Visit (INDEPENDENT_AMBULATORY_CARE_PROVIDER_SITE_OTHER): Payer: Medicaid Other | Admitting: Obstetrics and Gynecology

## 2021-03-25 VITALS — BP 116/78 | HR 75 | Wt 233.0 lb

## 2021-03-25 DIAGNOSIS — O09292 Supervision of pregnancy with other poor reproductive or obstetric history, second trimester: Secondary | ICD-10-CM

## 2021-03-25 DIAGNOSIS — Z8674 Personal history of sudden cardiac arrest: Secondary | ICD-10-CM

## 2021-03-25 DIAGNOSIS — Z3009 Encounter for other general counseling and advice on contraception: Secondary | ICD-10-CM

## 2021-03-25 DIAGNOSIS — S3769XS Other injury of uterus, sequela: Secondary | ICD-10-CM

## 2021-03-25 DIAGNOSIS — O09299 Supervision of pregnancy with other poor reproductive or obstetric history, unspecified trimester: Secondary | ICD-10-CM

## 2021-03-25 DIAGNOSIS — O283 Abnormal ultrasonic finding on antenatal screening of mother: Secondary | ICD-10-CM

## 2021-03-25 DIAGNOSIS — Z3A28 28 weeks gestation of pregnancy: Secondary | ICD-10-CM

## 2021-03-25 DIAGNOSIS — O0990 Supervision of high risk pregnancy, unspecified, unspecified trimester: Secondary | ICD-10-CM

## 2021-03-25 DIAGNOSIS — O0992 Supervision of high risk pregnancy, unspecified, second trimester: Secondary | ICD-10-CM

## 2021-03-25 DIAGNOSIS — O34219 Maternal care for unspecified type scar from previous cesarean delivery: Secondary | ICD-10-CM

## 2021-03-25 DIAGNOSIS — Z8632 Personal history of gestational diabetes: Secondary | ICD-10-CM

## 2021-03-25 NOTE — Patient Instructions (Signed)

## 2021-03-25 NOTE — Progress Notes (Signed)
Pt present for routine prenatal visit. She has no complaints today.  PHQ9=4 GAD7=5

## 2021-03-25 NOTE — Progress Notes (Signed)
Subjective:  Cheryl Fischer is a 31 y.o. [phone] at [redacted]w[redacted]d being seen today for ongoing prenatal care.  She is currently monitored for the following issues for this high-risk pregnancy and has History of cesarean delivery, currently pregnant; Obesity during pregnancy; Hemorrhagic shock ([place]); History of uterine rupture; History of cardiac arrest; Supervision of high risk pregnancy, antepartum; History of gestational diabetes in prior pregnancy, currently pregnant; Supraumbilical hernia; Absent fetal nasal bone; BMI 40.0-44.9, adult ([place]); and Unwanted fertility on their problem list.  Patient reports no complaints.  Contractions: Not present. Vag. Bleeding: None.  Movement: Present. Denies leaking of fluid.   The following portions of the patient's history were reviewed and updated as appropriate: allergies, current medications, past family history, past medical history, past social history, past surgical history and problem list. Problem list updated.  Objective:   Vitals:   03/25/21 0945  BP: 116/78  Pulse: 75  Weight: 233 lb (105.7 kg)    Fetal Status: Fetal Heart Rate (bpm): 154   Movement: Present     General:  Alert, oriented and cooperative. Patient is in no acute distress.  Skin: Skin is warm and dry. No rash noted.   Cardiovascular: Normal heart rate noted  Respiratory: Normal respiratory effort, no problems with respiration noted  Abdomen: Soft, gravid, appropriate for gestational age. Pain/Pressure: Absent     Pelvic:  Cervical exam deferred        Extremities: Normal range of motion.     Mental Status: Normal mood and affect. Normal behavior. Normal judgment and thought content.   Urinalysis:      Assessment and Plan:  Pregnancy: [id number] at [redacted]w[redacted]d  1. Supervision of high risk pregnancy, antepartum Stable  2. History of gestational diabetes in prior pregnancy, currently pregnant Normal 2 hr GTT this preg  3. Rupture of uterus, sequela See OP note For repeat c section with  BTL at 36 weeks  4. History of cesarean delivery, currently pregnant See above  5. History of cardiac arrest See last OP Anesthesia consult prior to c section  6. Unwanted fertility BTL papers signed  7. Absent fetal nasal bone Stable  Preterm labor symptoms and general obstetric precautions including but not limited to vaginal bleeding, contractions, leaking of fluid and fetal movement were reviewed in detail with the patient. Please refer to After Visit Summary for other counseling recommendations.  Return in about 2 weeks (around 04/08/2021) for OB visit, face to face, MD only.   Chancy Milroy, MD

## 2021-04-08 ENCOUNTER — Ambulatory Visit (INDEPENDENT_AMBULATORY_CARE_PROVIDER_SITE_OTHER): Payer: Medicaid Other | Admitting: Obstetrics and Gynecology

## 2021-04-08 ENCOUNTER — Other Ambulatory Visit: Payer: Self-pay

## 2021-04-08 ENCOUNTER — Encounter: Payer: Self-pay | Admitting: Obstetrics and Gynecology

## 2021-04-08 VITALS — BP 113/72 | HR 72 | Wt 238.6 lb

## 2021-04-08 DIAGNOSIS — Z23 Encounter for immunization: Secondary | ICD-10-CM

## 2021-04-08 DIAGNOSIS — O0990 Supervision of high risk pregnancy, unspecified, unspecified trimester: Secondary | ICD-10-CM

## 2021-04-08 DIAGNOSIS — O09293 Supervision of pregnancy with other poor reproductive or obstetric history, third trimester: Secondary | ICD-10-CM

## 2021-04-08 DIAGNOSIS — Z3009 Encounter for other general counseling and advice on contraception: Secondary | ICD-10-CM

## 2021-04-08 DIAGNOSIS — Z8632 Personal history of gestational diabetes: Secondary | ICD-10-CM

## 2021-04-08 DIAGNOSIS — O09299 Supervision of pregnancy with other poor reproductive or obstetric history, unspecified trimester: Secondary | ICD-10-CM

## 2021-04-08 DIAGNOSIS — O0993 Supervision of high risk pregnancy, unspecified, third trimester: Secondary | ICD-10-CM

## 2021-04-08 DIAGNOSIS — Z3A30 30 weeks gestation of pregnancy: Secondary | ICD-10-CM

## 2021-04-08 DIAGNOSIS — O34219 Maternal care for unspecified type scar from previous cesarean delivery: Secondary | ICD-10-CM

## 2021-04-08 DIAGNOSIS — Z8674 Personal history of sudden cardiac arrest: Secondary | ICD-10-CM

## 2021-04-08 DIAGNOSIS — S3769XS Other injury of uterus, sequela: Secondary | ICD-10-CM

## 2021-04-08 NOTE — Progress Notes (Signed)
Subjective:  Cheryl Fischer is a 31 y.o. [phone] at [redacted]w[redacted]d being seen today for ongoing prenatal care.  She is currently monitored for the following issues for this high-risk pregnancy and has History of cesarean delivery, currently pregnant; Obesity during pregnancy; Hemorrhagic shock ([place]); History of uterine rupture; History of cardiac arrest; Supervision of high risk pregnancy, antepartum; History of gestational diabetes in prior pregnancy, currently pregnant; Supraumbilical hernia; Absent fetal nasal bone; BMI 40.0-44.9, adult ([place]); and Unwanted fertility on their problem list.  Patient reports no complaints.  Contractions: Not present. Vag. Bleeding: None.  Movement: Present. Denies leaking of fluid.   The following portions of the patient's history were reviewed and updated as appropriate: allergies, current medications, past family history, past medical history, past social history, past surgical history and problem list. Problem list updated.  Objective:   Vitals:   04/08/21 0835  BP: 113/72  Pulse: 72  Weight: 238 lb 9.6 oz (108.2 kg)    Fetal Status:     Movement: Present     General:  Alert, oriented and cooperative. Patient is in no acute distress.  Skin: Skin is warm and dry. No rash noted.   Cardiovascular: Normal heart rate noted  Respiratory: Normal respiratory effort, no problems with respiration noted  Abdomen: Soft, gravid, appropriate for gestational age. Pain/Pressure: Absent     Pelvic:  Cervical exam deferred        Extremities: Normal range of motion.  Edema: None  Mental Status: Normal mood and affect. Normal behavior. Normal judgment and thought content.   Urinalysis:      Assessment and Plan:  Pregnancy: [id number] at [redacted]w[redacted]d  1. Supervision of high risk pregnancy, antepartum Stable Growth scan next week Tdap today  2. History of gestational diabetes in prior pregnancy, currently pregnant Normal Glucola this pregnancy  3. H/O c section  For repeat at 36  weeks  4. H/O uterine rupture and cardiac arrest For repeat c section and BTL at 36 weeks Note sent to schedule  5. Unwanted fertility    BTL papers signed  Preterm labor symptoms and general obstetric precautions including but not limited to vaginal bleeding, contractions, leaking of fluid and fetal movement were reviewed in detail with the patient. Please refer to After Visit Summary for other counseling recommendations.  Return in about 2 weeks (around 04/22/2021) for OB visit, face to face, MD only.   Chancy Milroy, MD

## 2021-04-08 NOTE — Progress Notes (Signed)
Patient presents for ROB. Patient has no concerns today. TDAP given today. 

## 2021-04-08 NOTE — Patient Instructions (Signed)

## 2021-04-17 ENCOUNTER — Ambulatory Visit: Payer: Medicaid Other | Attending: Maternal & Fetal Medicine

## 2021-04-17 ENCOUNTER — Ambulatory Visit: Payer: Medicaid Other | Admitting: *Deleted

## 2021-04-17 ENCOUNTER — Other Ambulatory Visit: Payer: Self-pay

## 2021-04-17 ENCOUNTER — Other Ambulatory Visit: Payer: Self-pay | Admitting: *Deleted

## 2021-04-17 VITALS — BP 106/66 | HR 76

## 2021-04-17 DIAGNOSIS — O43192 Other malformation of placenta, second trimester: Secondary | ICD-10-CM | POA: Diagnosis not present

## 2021-04-17 DIAGNOSIS — O99213 Obesity complicating pregnancy, third trimester: Secondary | ICD-10-CM

## 2021-04-17 DIAGNOSIS — O09299 Supervision of pregnancy with other poor reproductive or obstetric history, unspecified trimester: Secondary | ICD-10-CM | POA: Diagnosis present

## 2021-04-17 DIAGNOSIS — O34219 Maternal care for unspecified type scar from previous cesarean delivery: Secondary | ICD-10-CM | POA: Insufficient documentation

## 2021-04-17 DIAGNOSIS — Z8632 Personal history of gestational diabetes: Secondary | ICD-10-CM | POA: Insufficient documentation

## 2021-04-17 DIAGNOSIS — O0990 Supervision of high risk pregnancy, unspecified, unspecified trimester: Secondary | ICD-10-CM | POA: Diagnosis present

## 2021-04-17 DIAGNOSIS — O99212 Obesity complicating pregnancy, second trimester: Secondary | ICD-10-CM | POA: Diagnosis not present

## 2021-04-17 DIAGNOSIS — Z8674 Personal history of sudden cardiac arrest: Secondary | ICD-10-CM

## 2021-04-17 DIAGNOSIS — Z87828 Personal history of other (healed) physical injury and trauma: Secondary | ICD-10-CM

## 2021-04-17 DIAGNOSIS — Z8709 Personal history of other diseases of the respiratory system: Secondary | ICD-10-CM

## 2021-04-17 DIAGNOSIS — O283 Abnormal ultrasonic finding on antenatal screening of mother: Secondary | ICD-10-CM

## 2021-04-17 DIAGNOSIS — O24419 Gestational diabetes mellitus in pregnancy, unspecified control: Secondary | ICD-10-CM | POA: Diagnosis present

## 2021-04-17 DIAGNOSIS — Z3A31 31 weeks gestation of pregnancy: Secondary | ICD-10-CM

## 2021-04-22 ENCOUNTER — Ambulatory Visit (INDEPENDENT_AMBULATORY_CARE_PROVIDER_SITE_OTHER): Payer: Medicaid Other | Admitting: Obstetrics and Gynecology

## 2021-04-22 ENCOUNTER — Encounter: Payer: Self-pay | Admitting: Obstetrics and Gynecology

## 2021-04-22 ENCOUNTER — Other Ambulatory Visit: Payer: Self-pay

## 2021-04-22 ENCOUNTER — Other Ambulatory Visit: Payer: Self-pay | Admitting: Family Medicine

## 2021-04-22 VITALS — BP 111/74 | HR 71 | Wt 236.0 lb

## 2021-04-22 DIAGNOSIS — O0993 Supervision of high risk pregnancy, unspecified, third trimester: Secondary | ICD-10-CM

## 2021-04-22 DIAGNOSIS — Z98891 History of uterine scar from previous surgery: Secondary | ICD-10-CM

## 2021-04-22 DIAGNOSIS — Z3A32 32 weeks gestation of pregnancy: Secondary | ICD-10-CM

## 2021-04-22 DIAGNOSIS — Z8759 Personal history of other complications of pregnancy, childbirth and the puerperium: Secondary | ICD-10-CM

## 2021-04-22 DIAGNOSIS — Z3009 Encounter for other general counseling and advice on contraception: Secondary | ICD-10-CM

## 2021-04-22 DIAGNOSIS — S3769XS Other injury of uterus, sequela: Secondary | ICD-10-CM

## 2021-04-22 DIAGNOSIS — O0990 Supervision of high risk pregnancy, unspecified, unspecified trimester: Secondary | ICD-10-CM

## 2021-04-22 DIAGNOSIS — O34219 Maternal care for unspecified type scar from previous cesarean delivery: Secondary | ICD-10-CM

## 2021-04-22 NOTE — Patient Instructions (Signed)

## 2021-04-22 NOTE — Progress Notes (Signed)
Subjective:  ?Cheryl Fischer is a 31 y.o. [phone] at [redacted]w[redacted]d being seen today for ongoing prenatal care.  She is currently monitored for the following issues for this high-risk pregnancy and has History of cesarean delivery, currently pregnant; Obesity during pregnancy; Hemorrhagic shock ([place]); History of uterine rupture; History of cardiac arrest; Supervision of high risk pregnancy, antepartum; History of gestational diabetes in prior pregnancy, currently pregnant; Supraumbilical hernia; Absent fetal nasal bone; BMI 40.0-44.9, adult ([place]); and Unwanted fertility on their problem list. ? ?Patient reports general discomforts of pregnancy.  Contractions: Not present. Vag. Bleeding: None.  Movement: Present. Denies leaking of fluid.  ? ?The following portions of the patient's history were reviewed and updated as appropriate: allergies, current medications, past family history, past medical history, past social history, past surgical history and problem list. Problem list updated. ? ?Objective:  ? ?Vitals:  ? 04/22/21 1037  ?BP: 111/74  ?Pulse: 71  ?Weight: 236 lb (107 kg)  ? ? ?Fetal Status: Fetal Heart Rate (bpm): 159   Movement: Present    ? ?General:  Alert, oriented and cooperative. Patient is in no acute distress.  ?Skin: Skin is warm and dry. No rash noted.   ?Cardiovascular: Normal heart rate noted  ?Respiratory: Normal respiratory effort, no problems with respiration noted  ?Abdomen: Soft, gravid, appropriate for gestational age. Pain/Pressure: Absent     ?Pelvic:  Cervical exam deferred        ?Extremities: Normal range of motion.  Edema: None  ?Mental Status: Normal mood and affect. Normal behavior. Normal judgment and thought content.  ? ?Urinalysis:     ? ?Assessment and Plan:  ?Pregnancy: [id number] at [redacted]w[redacted]d ? ?1. Supervision of high risk pregnancy, antepartum ?Stable ?Serial growth scans and antenatal testing as per MFM ? ?2. History of cesarean delivery, currently pregnant ?For repeat with BTL at 36 weeks ? ?3.  Rupture of uterus, sequela ?See above ? ?4. Unwanted fertility ?BTL papers completed ? ?Preterm labor symptoms and general obstetric precautions including but not limited to vaginal bleeding, contractions, leaking of fluid and fetal movement were reviewed in detail with the patient. ?Please refer to After Visit Summary for other counseling recommendations.  ?Return in about 2 weeks (around 05/06/2021) for OB visit, face to face, MD only. ? ? ?Chancy Milroy, MD ?

## 2021-05-07 ENCOUNTER — Other Ambulatory Visit: Payer: Self-pay

## 2021-05-07 ENCOUNTER — Other Ambulatory Visit (HOSPITAL_COMMUNITY)
Admission: RE | Admit: 2021-05-07 | Discharge: 2021-05-07 | Disposition: A | Discharge status: Home / Self Care | Payer: Medicaid Other | Source: Ambulatory Visit | Attending: Obstetrics and Gynecology | Admitting: Obstetrics and Gynecology

## 2021-05-07 ENCOUNTER — Encounter: Payer: Self-pay | Admitting: Obstetrics and Gynecology

## 2021-05-07 ENCOUNTER — Ambulatory Visit (INDEPENDENT_AMBULATORY_CARE_PROVIDER_SITE_OTHER): Payer: Medicaid Other | Admitting: Obstetrics and Gynecology

## 2021-05-07 VITALS — BP 126/82 | HR 96 | Wt 239.0 lb

## 2021-05-07 DIAGNOSIS — O0990 Supervision of high risk pregnancy, unspecified, unspecified trimester: Secondary | ICD-10-CM

## 2021-05-07 DIAGNOSIS — Z3A34 34 weeks gestation of pregnancy: Secondary | ICD-10-CM

## 2021-05-07 DIAGNOSIS — Z6841 Body Mass Index (BMI) 40.0 and over, adult: Secondary | ICD-10-CM

## 2021-05-07 DIAGNOSIS — Z3009 Encounter for other general counseling and advice on contraception: Secondary | ICD-10-CM

## 2021-05-07 DIAGNOSIS — Z98891 History of uterine scar from previous surgery: Secondary | ICD-10-CM

## 2021-05-07 DIAGNOSIS — S3769XS Other injury of uterus, sequela: Secondary | ICD-10-CM

## 2021-05-07 NOTE — Progress Notes (Signed)
? ?  PRENATAL VISIT NOTE ? ?Subjective:  ?Cheryl Fischer is a 31 y.o. [phone] at [redacted]w[redacted]d being seen today for ongoing prenatal care.  She is currently monitored for the following issues for this high-risk pregnancy and has history of cesarean section x 5; Obesity during pregnancy; Hemorrhagic shock ([place]); History of uterine rupture; History of cardiac arrest; Supervision of high risk pregnancy, antepartum; History of gestational diabetes in prior pregnancy, currently pregnant; Supraumbilical hernia; Absent fetal nasal bone; BMI 40.0-44.9, adult ([place]); and Unwanted fertility on their problem list. ? ?Patient doing well with no acute concerns today. She reports no complaints.  Contractions: Not present. Vag. Bleeding: None.  Movement: Present. Denies leaking of fluid.  ? ?The following portions of the patient's history were reviewed and updated as appropriate: allergies, current medications, past family history, past medical history, past social history, past surgical history and problem list. Problem list updated. ? ?Objective:  ? ?Vitals:  ? 05/07/21 1122  ?BP: 126/82  ?Pulse: 96  ?Weight: 239 lb (108.4 kg)  ? ? ?Fetal Status: Fetal Heart Rate (bpm): 145 (Simultaneous filing. User may not have seen previous data.)   Movement: Present    ? ?General:  Alert, oriented and cooperative. Patient is in no acute distress.  ?Skin: Skin is warm and dry. No rash noted.   ?Cardiovascular: Normal heart rate noted  ?Respiratory: Normal respiratory effort, no problems with respiration noted  ?Abdomen: Soft, gravid, appropriate for gestational age.  Pain/Pressure: Absent     ?Pelvic: Cervical exam deferred        ?Extremities: Normal range of motion.  Edema: None  ?Mental Status:  Normal mood and affect. Normal behavior. Normal judgment and thought content.  ? ?Assessment and Plan:  ?Pregnancy: [id number] at [redacted]w[redacted]d ? ?1. Supervision of high risk pregnancy, antepartum ?Continue routine care ?Vaginal swabs acquired today ? ?- Strep Gp B NAA ?-  GC/Chlamydia probe amp ([place])not at [facility] ? ?2. [redacted] weeks gestation of pregnancy ? ? ?3. Rupture of uterus, sequela ?Hx of uterine rupture, pt has repeat c section scheduled at 36 weeks ? ?4. BMI 40.0-44.9, adult ([place]) ? ? ?5. Unwanted fertility ?Pt desires BTL at time of section ? ?6. history of cesarean section x 5 ?See above ? ?Preterm labor symptoms and general obstetric precautions including but not limited to vaginal bleeding, contractions, leaking of fluid and fetal movement were reviewed in detail with the patient. ? ?Please refer to After Visit Summary for other counseling recommendations.  ? ?Return in about 1 week (around 05/14/2021) for [facility], in person. ? ? ?Lynnda Shields, MD ?Faculty Attending ?Center for [place] ?  ?

## 2021-05-07 NOTE — Progress Notes (Signed)
Pt presents for ROB reports no complaints. ?

## 2021-05-07 NOTE — Patient Instructions (Signed)
Cheryl Fischer ? 05/07/2021 ? ? Your procedure is scheduled on:  05/20/2021 ? Arrive at 0930 at [employer] on [address] at [facility]  and [employer]. You are invited to use the FREE valet parking or use the Visitor's parking deck. ? Pick up the phone at the desk and dial [phone]. ? Call this number if you have problems the morning of surgery: [phone] ? Remember: ? ? Do not eat food:(After Midnight) Desp?s de medianoche. ? Do not drink clear liquids: (After Midnight) Desp?s de medianoche. ? Take these medicines the morning of surgery with A SIP OF WATER:  none ? ? Do not wear jewelry, make-up or nail polish. ? Do not wear lotions, powders, or perfumes. Do not wear deodorant. ? Do not shave 48 hours prior to surgery. ? Do not bring valuables to the hospital.  [facility] is not  ? responsible for any belongings or valuables brought to the hospital. ? Contacts, dentures or bridgework may not be worn into surgery. ? Leave suitcase in the car. After surgery it may be brought to your room. ? For patients admitted to the hospital, checkout time is 11:00 AM the day of  ?            discharge. ? ?   ? Please read over the following fact sheets that you were given:  ?   Preparing for Surgery ? ? ?

## 2021-05-08 ENCOUNTER — Ambulatory Visit: Payer: Medicaid Other | Admitting: *Deleted

## 2021-05-08 ENCOUNTER — Ambulatory Visit: Payer: Medicaid Other | Attending: Obstetrics

## 2021-05-08 ENCOUNTER — Encounter (HOSPITAL_COMMUNITY): Payer: Self-pay

## 2021-05-08 VITALS — BP 113/65 | HR 82

## 2021-05-08 DIAGNOSIS — O99213 Obesity complicating pregnancy, third trimester: Secondary | ICD-10-CM

## 2021-05-08 DIAGNOSIS — Z8632 Personal history of gestational diabetes: Secondary | ICD-10-CM | POA: Insufficient documentation

## 2021-05-08 DIAGNOSIS — E669 Obesity, unspecified: Secondary | ICD-10-CM | POA: Diagnosis not present

## 2021-05-08 DIAGNOSIS — O09293 Supervision of pregnancy with other poor reproductive or obstetric history, third trimester: Secondary | ICD-10-CM

## 2021-05-08 DIAGNOSIS — O0990 Supervision of high risk pregnancy, unspecified, unspecified trimester: Secondary | ICD-10-CM | POA: Diagnosis present

## 2021-05-08 DIAGNOSIS — O43199 Other malformation of placenta, unspecified trimester: Secondary | ICD-10-CM | POA: Insufficient documentation

## 2021-05-08 DIAGNOSIS — Z3A34 34 weeks gestation of pregnancy: Secondary | ICD-10-CM

## 2021-05-08 DIAGNOSIS — O09299 Supervision of pregnancy with other poor reproductive or obstetric history, unspecified trimester: Secondary | ICD-10-CM | POA: Insufficient documentation

## 2021-05-08 DIAGNOSIS — Z8674 Personal history of sudden cardiac arrest: Secondary | ICD-10-CM | POA: Diagnosis present

## 2021-05-08 DIAGNOSIS — Z8709 Personal history of other diseases of the respiratory system: Secondary | ICD-10-CM | POA: Diagnosis present

## 2021-05-08 DIAGNOSIS — O34219 Maternal care for unspecified type scar from previous cesarean delivery: Secondary | ICD-10-CM

## 2021-05-08 DIAGNOSIS — Q758 Other specified congenital malformations of skull and face bones: Secondary | ICD-10-CM | POA: Insufficient documentation

## 2021-05-08 DIAGNOSIS — Z87828 Personal history of other (healed) physical injury and trauma: Secondary | ICD-10-CM | POA: Diagnosis present

## 2021-05-08 LAB — GC/CHLAMYDIA PROBE AMP (~~LOC~~) NOT AT ARMC
Chlamydia: NEGATIVE
Comment: NEGATIVE
Comment: NORMAL
Neisseria Gonorrhea: NEGATIVE

## 2021-05-08 NOTE — Procedures (Signed)
Nicoya Friel ?October 28, 1990 ?[redacted]w[redacted]d ? ?Fetus A Non-Stress Test Interpretation for 05/08/21 ? ?Indication:  prior uterine rupture ? ?Fetal Heart Rate A ?Mode: External ?Baseline Rate (A): 150 bpm ?Variability: Moderate ?Accelerations: 15 x 15 ?Decelerations: None ?Multiple birth?: No ? ?Uterine Activity ?Mode: Palpation, Toco ?Contraction Frequency (min): ui ?Resting Tone Palpated: Relaxed ? ?Interpretation (Fetal Testing) ?Nonstress Test Interpretation: Reactive ?Overall Impression: Reassuring for gestational age ?Comments: Dr. Parke Poisson reviewed tracing ? ? ?

## 2021-05-09 LAB — STREP GP B NAA: Strep Gp B NAA: NEGATIVE

## 2021-05-14 ENCOUNTER — Ambulatory Visit (INDEPENDENT_AMBULATORY_CARE_PROVIDER_SITE_OTHER): Payer: Medicaid Other | Admitting: Obstetrics and Gynecology

## 2021-05-14 VITALS — BP 107/75 | HR 91 | Wt 238.0 lb

## 2021-05-14 DIAGNOSIS — Z98891 History of uterine scar from previous surgery: Secondary | ICD-10-CM

## 2021-05-14 DIAGNOSIS — O99210 Obesity complicating pregnancy, unspecified trimester: Secondary | ICD-10-CM

## 2021-05-14 DIAGNOSIS — Z3A35 35 weeks gestation of pregnancy: Secondary | ICD-10-CM

## 2021-05-14 DIAGNOSIS — O0990 Supervision of high risk pregnancy, unspecified, unspecified trimester: Secondary | ICD-10-CM

## 2021-05-14 DIAGNOSIS — S3769XS Other injury of uterus, sequela: Secondary | ICD-10-CM

## 2021-05-14 NOTE — Progress Notes (Signed)
? ?  PRENATAL VISIT NOTE ? ?Subjective:  ?Cheryl Fischer is a 31 y.o. [phone] at [redacted]w[redacted]d being seen today for ongoing prenatal care.  She is currently monitored for the following issues for this high-risk pregnancy and has history of cesarean section x 5; Obesity during pregnancy; Hemorrhagic shock ([facility]); History of uterine rupture; History of cardiac arrest; Supervision of high risk pregnancy, antepartum; History of gestational diabetes in prior pregnancy, currently pregnant; Supraumbilical hernia; Absent fetal nasal bone; BMI 40.0-44.9, adult ([place]); and Unwanted fertility on their problem list. ? ?Patient doing well with no acute concerns today. She reports no complaints.  Contractions: Irregular. Vag. Bleeding: None.  Movement: Present. Denies leaking of fluid.  ? ?The following portions of the patient's history were reviewed and updated as appropriate: allergies, current medications, past family history, past medical history, past social history, past surgical history and problem list. Problem list updated. ? ?Objective:  ? ?Vitals:  ? 05/14/21 1409  ?BP: 107/75  ?Pulse: 91  ?Weight: 238 lb (108 kg)  ? ? ?Fetal Status: Fetal Heart Rate (bpm): 145 Fundal Height: 37 cm Movement: Present    ? ?General:  Alert, oriented and cooperative. Patient is in no acute distress.  ?Skin: Skin is warm and dry. No rash noted.   ?Cardiovascular: Normal heart rate noted  ?Respiratory: Normal respiratory effort, no problems with respiration noted  ?Abdomen: Soft, gravid, appropriate for gestational age.  Pain/Pressure: Absent     ?Pelvic: Cervical exam deferred        ?Extremities: Normal range of motion.     ?Mental Status:  Normal mood and affect. Normal behavior. Normal judgment and thought content.  ? ?Assessment and Plan:  ?Pregnancy: [id number] at [redacted]w[redacted]d ? ?1. [redacted] weeks gestation of pregnancy ? ? ?2. Rupture of uterus, sequela ?Repeat c section scheduled for 05/20/21 ? ?3. Supervision of high risk pregnancy, antepartum ?Routine care, c/s  as above, pt has fetal testing on 04/27/21 ? ?4. Obesity during pregnancy ? ? ?5. history of cesarean section x 5 ?Pt to receive BTL if possible ? ?Preterm labor symptoms and general obstetric precautions including but not limited to vaginal bleeding, contractions, leaking of fluid and fetal movement were reviewed in detail with the patient. ? ?Please refer to After Visit Summary for other counseling recommendations.  ? ?No follow-ups on file. ? ? ?Lynnda Shields, MD ?Faculty Attending ?[facility] ?  ?

## 2021-05-15 ENCOUNTER — Ambulatory Visit: Payer: Medicaid Other | Admitting: *Deleted

## 2021-05-15 ENCOUNTER — Ambulatory Visit: Payer: Medicaid Other | Attending: Obstetrics

## 2021-05-15 VITALS — BP 106/68 | HR 85

## 2021-05-15 DIAGNOSIS — O34219 Maternal care for unspecified type scar from previous cesarean delivery: Secondary | ICD-10-CM | POA: Insufficient documentation

## 2021-05-15 DIAGNOSIS — O99213 Obesity complicating pregnancy, third trimester: Secondary | ICD-10-CM | POA: Insufficient documentation

## 2021-05-15 DIAGNOSIS — Z87828 Personal history of other (healed) physical injury and trauma: Secondary | ICD-10-CM | POA: Insufficient documentation

## 2021-05-15 DIAGNOSIS — O09299 Supervision of pregnancy with other poor reproductive or obstetric history, unspecified trimester: Secondary | ICD-10-CM

## 2021-05-15 DIAGNOSIS — O288 Other abnormal findings on antenatal screening of mother: Secondary | ICD-10-CM

## 2021-05-15 DIAGNOSIS — O09293 Supervision of pregnancy with other poor reproductive or obstetric history, third trimester: Secondary | ICD-10-CM | POA: Diagnosis not present

## 2021-05-15 DIAGNOSIS — Z8674 Personal history of sudden cardiac arrest: Secondary | ICD-10-CM | POA: Diagnosis present

## 2021-05-15 DIAGNOSIS — Z8632 Personal history of gestational diabetes: Secondary | ICD-10-CM | POA: Insufficient documentation

## 2021-05-15 DIAGNOSIS — O0990 Supervision of high risk pregnancy, unspecified, unspecified trimester: Secondary | ICD-10-CM

## 2021-05-15 DIAGNOSIS — Z8709 Personal history of other diseases of the respiratory system: Secondary | ICD-10-CM | POA: Diagnosis present

## 2021-05-15 DIAGNOSIS — E669 Obesity, unspecified: Secondary | ICD-10-CM

## 2021-05-15 DIAGNOSIS — O24419 Gestational diabetes mellitus in pregnancy, unspecified control: Secondary | ICD-10-CM

## 2021-05-15 DIAGNOSIS — O35AXX0 Maternal care for other (suspected) fetal abnormality and damage, fetal facial anomalies, not applicable or unspecified: Secondary | ICD-10-CM

## 2021-05-15 DIAGNOSIS — Z3A34 34 weeks gestation of pregnancy: Secondary | ICD-10-CM

## 2021-05-15 NOTE — Procedures (Signed)
Cheryl Fischer ?[birth year] ?[redacted]w[redacted]d ? ?Fetus A Non-Stress Test Interpretation for 05/15/21 ? ?Indication:  hx uterine rupture ? ?Fetal Heart Rate A ?Mode: External ?Baseline Rate (A): 140 bpm ?Variability: Moderate ?Accelerations: 15 x 15 ?Decelerations: None ?Multiple birth?: No ? ?Uterine Activity ?Mode: Palpation, Toco ?Contraction Frequency (min): none ?Resting Tone Palpated: Relaxed ? ?Interpretation (Fetal Testing) ?Nonstress Test Interpretation: Reactive ?Overall Impression: Reassuring for gestational age ?Comments: Dr. Annamaria Boots reviewed tracing ? ? ?

## 2021-05-18 ENCOUNTER — Encounter (HOSPITAL_COMMUNITY)
Admission: RE | Admit: 2021-05-18 | Discharge: 2021-05-18 | Disposition: A | Discharge status: Home / Self Care | Payer: Medicaid Other | Source: Ambulatory Visit | Attending: Obstetrics & Gynecology | Admitting: Obstetrics & Gynecology

## 2021-05-18 DIAGNOSIS — Z98891 History of uterine scar from previous surgery: Secondary | ICD-10-CM | POA: Diagnosis not present

## 2021-05-18 DIAGNOSIS — Z01812 Encounter for preprocedural laboratory examination: Secondary | ICD-10-CM | POA: Diagnosis present

## 2021-05-18 LAB — CBC
HCT: 33.9 % — ABNORMAL LOW (ref 36.0–46.0)
Hemoglobin: 11.5 g/dL — ABNORMAL LOW (ref 12.0–15.0)
MCH: 31.6 pg (ref 26.0–34.0)
MCHC: 33.9 g/dL (ref 30.0–36.0)
MCV: 93.1 fL (ref 80.0–100.0)
Platelets: 195 10*3/uL (ref 150–400)
RBC: 3.64 MIL/uL — ABNORMAL LOW (ref 3.87–5.11)
RDW: 13.4 % (ref 11.5–15.5)
WBC: 9.2 10*3/uL (ref 4.0–10.5)
nRBC: 0 % (ref 0.0–0.2)

## 2021-05-19 LAB — RPR: RPR Ser Ql: NONREACTIVE

## 2021-05-20 ENCOUNTER — Other Ambulatory Visit: Payer: Self-pay

## 2021-05-20 ENCOUNTER — Encounter (HOSPITAL_COMMUNITY): Payer: Self-pay | Admitting: Family Medicine

## 2021-05-20 ENCOUNTER — Inpatient Hospital Stay (HOSPITAL_COMMUNITY)
Admission: RE | Admit: 2021-05-20 | Discharge: 2021-05-22 | DRG: 784 | Disposition: A | Discharge status: Home / Self Care | Payer: Medicaid Other | Attending: Family Medicine | Admitting: Family Medicine

## 2021-05-20 ENCOUNTER — Inpatient Hospital Stay (HOSPITAL_COMMUNITY): Payer: Medicaid Other | Admitting: Anesthesiology

## 2021-05-20 ENCOUNTER — Encounter (HOSPITAL_COMMUNITY)
Admission: RE | Disposition: A | Discharge status: Home / Self Care | Payer: Self-pay | Source: Home / Self Care | Attending: Family Medicine

## 2021-05-20 DIAGNOSIS — Z302 Encounter for sterilization: Secondary | ICD-10-CM

## 2021-05-20 DIAGNOSIS — O34219 Maternal care for unspecified type scar from previous cesarean delivery: Secondary | ICD-10-CM | POA: Diagnosis not present

## 2021-05-20 DIAGNOSIS — Z8759 Personal history of other complications of pregnancy, childbirth and the puerperium: Secondary | ICD-10-CM

## 2021-05-20 DIAGNOSIS — S3769XA Other injury of uterus, initial encounter: Secondary | ICD-10-CM | POA: Diagnosis present

## 2021-05-20 DIAGNOSIS — O283 Abnormal ultrasonic finding on antenatal screening of mother: Secondary | ICD-10-CM | POA: Diagnosis present

## 2021-05-20 DIAGNOSIS — O9081 Anemia of the puerperium: Secondary | ICD-10-CM | POA: Diagnosis not present

## 2021-05-20 DIAGNOSIS — Z3009 Encounter for other general counseling and advice on contraception: Secondary | ICD-10-CM | POA: Diagnosis present

## 2021-05-20 DIAGNOSIS — O99210 Obesity complicating pregnancy, unspecified trimester: Secondary | ICD-10-CM | POA: Diagnosis present

## 2021-05-20 DIAGNOSIS — Z3A36 36 weeks gestation of pregnancy: Secondary | ICD-10-CM

## 2021-05-20 DIAGNOSIS — Z880 Allergy status to penicillin: Secondary | ICD-10-CM

## 2021-05-20 DIAGNOSIS — K439 Ventral hernia without obstruction or gangrene: Secondary | ICD-10-CM | POA: Diagnosis present

## 2021-05-20 DIAGNOSIS — Z8674 Personal history of sudden cardiac arrest: Secondary | ICD-10-CM

## 2021-05-20 DIAGNOSIS — O34211 Maternal care for low transverse scar from previous cesarean delivery: Secondary | ICD-10-CM | POA: Diagnosis not present

## 2021-05-20 DIAGNOSIS — O99214 Obesity complicating childbirth: Secondary | ICD-10-CM | POA: Diagnosis present

## 2021-05-20 DIAGNOSIS — O99892 Other specified diseases and conditions complicating childbirth: Secondary | ICD-10-CM | POA: Diagnosis present

## 2021-05-20 DIAGNOSIS — D62 Acute posthemorrhagic anemia: Secondary | ICD-10-CM | POA: Diagnosis not present

## 2021-05-20 DIAGNOSIS — Z98891 History of uterine scar from previous surgery: Principal | ICD-10-CM

## 2021-05-20 DIAGNOSIS — O09299 Supervision of pregnancy with other poor reproductive or obstetric history, unspecified trimester: Secondary | ICD-10-CM

## 2021-05-20 DIAGNOSIS — O34212 Maternal care for vertical scar from previous cesarean delivery: Secondary | ICD-10-CM | POA: Diagnosis present

## 2021-05-20 DIAGNOSIS — Z9079 Acquired absence of other genital organ(s): Secondary | ICD-10-CM

## 2021-05-20 DIAGNOSIS — O0990 Supervision of high risk pregnancy, unspecified, unspecified trimester: Secondary | ICD-10-CM

## 2021-05-20 LAB — CBC
HCT: 34.3 % — ABNORMAL LOW (ref 36.0–46.0)
Hemoglobin: 11.6 g/dL — ABNORMAL LOW (ref 12.0–15.0)
MCH: 31.7 pg (ref 26.0–34.0)
MCHC: 33.8 g/dL (ref 30.0–36.0)
MCV: 93.7 fL (ref 80.0–100.0)
Platelets: 234 10*3/uL (ref 150–400)
RBC: 3.66 MIL/uL — ABNORMAL LOW (ref 3.87–5.11)
RDW: 13.6 % (ref 11.5–15.5)
WBC: 17.5 10*3/uL — ABNORMAL HIGH (ref 4.0–10.5)
nRBC: 0 % (ref 0.0–0.2)

## 2021-05-20 LAB — CREATININE, SERUM
Creatinine, Ser: 0.74 mg/dL (ref 0.44–1.00)
GFR, Estimated: >60 mL/min (ref >60)

## 2021-05-20 LAB — PREPARE RBC (CROSSMATCH)

## 2021-05-20 SURGERY — Surgical Case
Anesthesia: Spinal | Laterality: Bilateral

## 2021-05-20 MED ORDER — MORPHINE SULFATE (PF) 0.5 MG/ML IJ SOLN
INTRAMUSCULAR | Status: AC
  Filled 2021-05-20: qty 10

## 2021-05-20 MED ORDER — DEXAMETHASONE SODIUM PHOSPHATE 4 MG/ML IJ SOLN
INTRAMUSCULAR | Status: DC | PRN
  Administered 2021-05-20: 4 mg via INTRAVENOUS

## 2021-05-20 MED ORDER — BUPIVACAINE HCL 0.25 % IJ SOLN
INTRAMUSCULAR | Status: DC | PRN
Start: 2021-05-20 — End: 2021-05-20
  Administered 2021-05-20: 30 mL

## 2021-05-20 MED ORDER — KETOROLAC TROMETHAMINE 30 MG/ML IJ SOLN: 30.0000 mg | Freq: Once | INTRAMUSCULAR | Status: DC | PRN

## 2021-05-20 MED ORDER — HYDROMORPHONE HCL 1 MG/ML IJ SOLN: 0.2500 mg | INTRAMUSCULAR | Status: DC | PRN

## 2021-05-20 MED ORDER — KETOROLAC TROMETHAMINE 30 MG/ML IJ SOLN: 30.0000 mg | Freq: Four times a day (QID) | INTRAMUSCULAR | Status: AC | PRN

## 2021-05-20 MED ORDER — DIPHENHYDRAMINE HCL 25 MG PO CAPS: 25.0000 mg | ORAL_CAPSULE | Freq: Four times a day (QID) | ORAL | Status: DC | PRN

## 2021-05-20 MED ORDER — POVIDONE-IODINE 10 % EX SWAB
2.0000 "application " | Freq: Once | CUTANEOUS | Status: AC
  Administered 2021-05-20: 2 via TOPICAL

## 2021-05-20 MED ORDER — DIPHENHYDRAMINE HCL 50 MG/ML IJ SOLN: 12.5000 mg | INTRAMUSCULAR | Status: DC | PRN

## 2021-05-20 MED ORDER — PROPOFOL 10 MG/ML IV BOLUS
INTRAVENOUS | Status: AC
  Filled 2021-05-20: qty 20

## 2021-05-20 MED ORDER — SCOPOLAMINE 1 MG/3DAYS TD PT72
1.0000 | MEDICATED_PATCH | TRANSDERMAL | Status: DC
  Administered 2021-05-20: 1.5 mg via TRANSDERMAL

## 2021-05-20 MED ORDER — NALOXONE HCL 0.4 MG/ML IJ SOLN: 0.4000 mg | INTRAMUSCULAR | Status: DC | PRN

## 2021-05-20 MED ORDER — MIDAZOLAM HCL 2 MG/2ML IJ SOLN
INTRAMUSCULAR | Status: DC | PRN
  Administered 2021-05-20 (×2): 1 mg via INTRAVENOUS

## 2021-05-20 MED ORDER — ONDANSETRON HCL 4 MG/2ML IJ SOLN
INTRAMUSCULAR | Status: AC
  Filled 2021-05-20: qty 2

## 2021-05-20 MED ORDER — ENOXAPARIN SODIUM 60 MG/0.6ML IJ SOSY
50.0000 mg | PREFILLED_SYRINGE | INTRAMUSCULAR | Status: DC
  Administered 2021-05-21 – 2021-05-22 (×2): 50 mg via SUBCUTANEOUS
  Filled 2021-05-20 (×2): qty 0.6

## 2021-05-20 MED ORDER — TRANEXAMIC ACID-NACL 1000-0.7 MG/100ML-% IV SOLN
INTRAVENOUS | Status: DC | PRN
  Administered 2021-05-20: 1000 mg via INTRAVENOUS

## 2021-05-20 MED ORDER — COCONUT OIL OIL: 1.0000 "application " | TOPICAL_OIL | Status: DC | PRN

## 2021-05-20 MED ORDER — OXYTOCIN-SODIUM CHLORIDE 30-0.9 UT/500ML-% IV SOLN
INTRAVENOUS | Status: DC | PRN
  Administered 2021-05-20: 400 mL via INTRAVENOUS

## 2021-05-20 MED ORDER — TETANUS-DIPHTH-ACELL PERTUSSIS 5-2.5-18.5 LF-MCG/0.5 IM SUSY
0.5000 mL | PREFILLED_SYRINGE | Freq: Once | INTRAMUSCULAR | Status: DC
Start: 2021-05-21 — End: 2021-05-22

## 2021-05-20 MED ORDER — FENTANYL CITRATE (PF) 100 MCG/2ML IJ SOLN
INTRAMUSCULAR | Status: DC | PRN
  Administered 2021-05-20: 85 ug via INTRAVENOUS

## 2021-05-20 MED ORDER — ONDANSETRON HCL 4 MG/2ML IJ SOLN: 4.0000 mg | Freq: Three times a day (TID) | INTRAMUSCULAR | Status: DC | PRN

## 2021-05-20 MED ORDER — FENTANYL CITRATE (PF) 100 MCG/2ML IJ SOLN
INTRAMUSCULAR | Status: DC | PRN
  Administered 2021-05-20: 15 ug via INTRATHECAL

## 2021-05-20 MED ORDER — DIPHENHYDRAMINE HCL 25 MG PO CAPS: 25.0000 mg | ORAL_CAPSULE | ORAL | Status: DC | PRN

## 2021-05-20 MED ORDER — CLINDAMYCIN PHOSPHATE 900 MG/50ML IV SOLN
900.0000 mg | INTRAVENOUS | Status: AC
  Administered 2021-05-20: 900 mg via INTRAVENOUS

## 2021-05-20 MED ORDER — MEPERIDINE HCL 25 MG/ML IJ SOLN: 6.2500 mg | INTRAMUSCULAR | Status: DC | PRN

## 2021-05-20 MED ORDER — ACETAMINOPHEN 500 MG PO TABS
1000.0000 mg | ORAL_TABLET | Freq: Four times a day (QID) | ORAL | Status: DC
  Administered 2021-05-20 – 2021-05-22 (×7): 1000 mg via ORAL
  Filled 2021-05-20 (×7): qty 2

## 2021-05-20 MED ORDER — SOD CITRATE-CITRIC ACID 500-334 MG/5ML PO SOLN
30.0000 mL | Freq: Once | ORAL | Status: AC
  Administered 2021-05-20: 30 mL via ORAL

## 2021-05-20 MED ORDER — FENTANYL CITRATE (PF) 250 MCG/5ML IJ SOLN
INTRAMUSCULAR | Status: AC
  Filled 2021-05-20: qty 5

## 2021-05-20 MED ORDER — GENTAMICIN SULFATE 40 MG/ML IJ SOLN
5.0000 mg/kg | INTRAVENOUS | Status: AC
  Administered 2021-05-20: 370 mg via INTRAVENOUS
  Filled 2021-05-20: qty 9.25

## 2021-05-20 MED ORDER — PHENYLEPHRINE HCL-NACL 20-0.9 MG/250ML-% IV SOLN
INTRAVENOUS | Status: DC | PRN
  Administered 2021-05-20: 30 ug/min via INTRAVENOUS

## 2021-05-20 MED ORDER — SCOPOLAMINE 1 MG/3DAYS TD PT72: 1.0000 | MEDICATED_PATCH | Freq: Once | TRANSDERMAL | Status: DC

## 2021-05-20 MED ORDER — NALOXONE HCL 4 MG/10ML IJ SOLN
1.0000 ug/kg/h | INTRAVENOUS | Status: DC | PRN
  Filled 2021-05-20: qty 5

## 2021-05-20 MED ORDER — BUPIVACAINE IN DEXTROSE 0.75-8.25 % IT SOLN
INTRATHECAL | Status: DC | PRN
  Administered 2021-05-20: 1.2 mL via INTRATHECAL

## 2021-05-20 MED ORDER — FENTANYL CITRATE (PF) 100 MCG/2ML IJ SOLN
INTRAMUSCULAR | Status: AC
  Filled 2021-05-20: qty 2

## 2021-05-20 MED ORDER — SOD CITRATE-CITRIC ACID 500-334 MG/5ML PO SOLN
ORAL | Status: AC
  Filled 2021-05-20: qty 30

## 2021-05-20 MED ORDER — SIMETHICONE 80 MG PO CHEW: 80.0000 mg | CHEWABLE_TABLET | ORAL | Status: DC | PRN

## 2021-05-20 MED ORDER — MORPHINE SULFATE (PF) 0.5 MG/ML IJ SOLN
INTRAMUSCULAR | Status: DC | PRN
  Administered 2021-05-20: .15 mg via INTRATHECAL

## 2021-05-20 MED ORDER — BUPIVACAINE HCL (PF) 0.25 % IJ SOLN
INTRAMUSCULAR | Status: AC
  Filled 2021-05-20: qty 30

## 2021-05-20 MED ORDER — MORPHINE SULFATE (PF) 0.5 MG/ML IJ SOLN: INTRAMUSCULAR | Status: DC | PRN

## 2021-05-20 MED ORDER — LACTATED RINGERS IV BOLUS
500.0000 mL | Freq: Once | INTRAVENOUS | Status: AC
  Administered 2021-05-20: 500 mL via INTRAVENOUS

## 2021-05-20 MED ORDER — LEVONORGESTREL 20.1 MCG/DAY IU IUD
INTRAUTERINE_SYSTEM | INTRAUTERINE | Status: AC
  Filled 2021-05-20: qty 1

## 2021-05-20 MED ORDER — FENTANYL CITRATE (PF) 100 MCG/2ML IJ SOLN
INTRAMUSCULAR | Status: DC | PRN
  Administered 2021-05-20 (×2): 100 ug via INTRAVENOUS

## 2021-05-20 MED ORDER — OXYTOCIN-SODIUM CHLORIDE 30-0.9 UT/500ML-% IV SOLN
INTRAVENOUS | Status: AC
Start: 2021-05-20 — End: ?
  Filled 2021-05-20: qty 500

## 2021-05-20 MED ORDER — LACTATED RINGERS IV SOLN
120.0000 mL/h | INTRAVENOUS | Status: DC
Start: 2021-05-20 — End: 2021-05-20
  Administered 2021-05-20 (×2): 120 mL/h via INTRAVENOUS

## 2021-05-20 MED ORDER — ONDANSETRON HCL 4 MG/2ML IJ SOLN: 4.0000 mg | Freq: Once | INTRAMUSCULAR | Status: DC | PRN

## 2021-05-20 MED ORDER — CLINDAMYCIN PHOSPHATE 900 MG/50ML IV SOLN
INTRAVENOUS | Status: AC
  Filled 2021-05-20: qty 50

## 2021-05-20 MED ORDER — ACETAMINOPHEN 10 MG/ML IV SOLN
INTRAVENOUS | Status: DC | PRN
  Administered 2021-05-20: 1000 mg via INTRAVENOUS

## 2021-05-20 MED ORDER — KETOROLAC TROMETHAMINE 30 MG/ML IJ SOLN
30.0000 mg | Freq: Four times a day (QID) | INTRAMUSCULAR | Status: AC
  Administered 2021-05-20 – 2021-05-21 (×4): 30 mg via INTRAVENOUS
  Filled 2021-05-20 (×4): qty 1

## 2021-05-20 MED ORDER — ACETAMINOPHEN 10 MG/ML IV SOLN
INTRAVENOUS | Status: AC
  Filled 2021-05-20: qty 100

## 2021-05-20 MED ORDER — OXYCODONE HCL 5 MG PO TABS
5.0000 mg | ORAL_TABLET | Freq: Four times a day (QID) | ORAL | Status: DC | PRN
  Administered 2021-05-21 – 2021-05-22 (×4): 5 mg via ORAL
  Filled 2021-05-20 (×2): qty 1
  Filled 2021-05-20 (×3): qty 2

## 2021-05-20 MED ORDER — MIDAZOLAM HCL 2 MG/2ML IJ SOLN
INTRAMUSCULAR | Status: AC
Start: 2021-05-20 — End: ?
  Filled 2021-05-20: qty 2

## 2021-05-20 MED ORDER — ACETAMINOPHEN 500 MG PO TABS: 1000.0000 mg | ORAL_TABLET | Freq: Four times a day (QID) | ORAL | Status: DC

## 2021-05-20 MED ORDER — SCOPOLAMINE 1 MG/3DAYS TD PT72
MEDICATED_PATCH | TRANSDERMAL | Status: AC
  Filled 2021-05-20: qty 1

## 2021-05-20 MED ORDER — ZOLPIDEM TARTRATE 5 MG PO TABS: 5.0000 mg | ORAL_TABLET | Freq: Every evening | ORAL | Status: DC | PRN

## 2021-05-20 MED ORDER — SODIUM CHLORIDE 0.9% IV SOLUTION: Freq: Once | INTRAVENOUS | Status: DC

## 2021-05-20 MED ORDER — SIMETHICONE 80 MG PO CHEW
80.0000 mg | CHEWABLE_TABLET | Freq: Three times a day (TID) | ORAL | Status: DC
  Administered 2021-05-21 – 2021-05-22 (×5): 80 mg via ORAL
  Filled 2021-05-20 (×5): qty 1

## 2021-05-20 MED ORDER — WITCH HAZEL-GLYCERIN EX PADS: 1.0000 "application " | MEDICATED_PAD | CUTANEOUS | Status: DC | PRN

## 2021-05-20 MED ORDER — IBUPROFEN 600 MG PO TABS
600.0000 mg | ORAL_TABLET | Freq: Four times a day (QID) | ORAL | Status: DC
  Administered 2021-05-21 – 2021-05-22 (×3): 600 mg via ORAL
  Filled 2021-05-20 (×4): qty 1

## 2021-05-20 MED ORDER — DEXAMETHASONE SODIUM PHOSPHATE 4 MG/ML IJ SOLN
INTRAMUSCULAR | Status: AC
Start: 2021-05-20 — End: ?
  Filled 2021-05-20: qty 1

## 2021-05-20 MED ORDER — OXYCODONE HCL 5 MG PO TABS: 5.0000 mg | ORAL_TABLET | Freq: Four times a day (QID) | ORAL | Status: DC | PRN

## 2021-05-20 MED ORDER — LEVONORGESTREL 20 MCG/DAY IU IUD
INTRAUTERINE_SYSTEM | INTRAUTERINE | Status: AC
  Filled 2021-05-20: qty 1

## 2021-05-20 MED ORDER — MENTHOL 3 MG MT LOZG: 1.0000 | LOZENGE | OROMUCOSAL | Status: DC | PRN

## 2021-05-20 MED ORDER — LEVONORGESTREL 20.1 MCG/DAY IU IUD
1.0000 | INTRAUTERINE_SYSTEM | INTRAUTERINE | Status: DC
Start: 2021-05-20 — End: 2021-05-20

## 2021-05-20 MED ORDER — LIDOCAINE-EPINEPHRINE (PF) 2 %-1:200000 IJ SOLN
INTRAMUSCULAR | Status: DC | PRN
  Administered 2021-05-20: 3 mL via EPIDURAL

## 2021-05-20 MED ORDER — SENNOSIDES-DOCUSATE SODIUM 8.6-50 MG PO TABS
2.0000 | ORAL_TABLET | Freq: Every day | ORAL | Status: DC
  Administered 2021-05-21 – 2021-05-22 (×2): 2 via ORAL
  Filled 2021-05-20 (×2): qty 2

## 2021-05-20 MED ORDER — OXYTOCIN-SODIUM CHLORIDE 30-0.9 UT/500ML-% IV SOLN: 2.5000 [IU]/h | INTRAVENOUS | Status: AC

## 2021-05-20 MED ORDER — BUPIVACAINE IN DEXTROSE 0.75-8.25 % IT SOLN
INTRATHECAL | Status: AC
  Filled 2021-05-20: qty 2

## 2021-05-20 MED ORDER — SODIUM CHLORIDE 0.9% FLUSH: 3.0000 mL | INTRAVENOUS | Status: DC | PRN

## 2021-05-20 MED ORDER — ONDANSETRON HCL 4 MG/2ML IJ SOLN
INTRAMUSCULAR | Status: DC | PRN
  Administered 2021-05-20: 4 mg via INTRAVENOUS

## 2021-05-20 MED ORDER — DIBUCAINE (PERIANAL) 1 % EX OINT: 1.0000 "application " | TOPICAL_OINTMENT | CUTANEOUS | Status: DC | PRN

## 2021-05-20 MED ORDER — FENTANYL CITRATE (PF) 100 MCG/2ML IJ SOLN
INTRAMUSCULAR | Status: AC
Start: 2021-05-20 — End: ?
  Filled 2021-05-20: qty 2

## 2021-05-20 MED ORDER — PRENATAL MULTIVITAMIN CH
1.0000 | ORAL_TABLET | Freq: Every day | ORAL | Status: DC
  Administered 2021-05-21 – 2021-05-22 (×2): 1 via ORAL
  Filled 2021-05-20 (×2): qty 1

## 2021-05-20 SURGICAL SUPPLY — 36 items
BENZOIN TINCTURE PRP APPL 2/3 (GAUZE/BANDAGES/DRESSINGS) ×2 IMPLANT
CANISTER SUCT 3000ML PPV (MISCELLANEOUS) ×2 IMPLANT
CHLORAPREP W/TINT 26ML (MISCELLANEOUS) ×2 IMPLANT
CLAMP CORD UMBIL (MISCELLANEOUS) ×2 IMPLANT
CLOTH BEACON ORANGE TIMEOUT ST (SAFETY) ×2 IMPLANT
DRAPE SHEET LG 3/4 BI-LAMINATE (DRAPES) ×2 IMPLANT
DRSG OPSITE POSTOP 4X10 (GAUZE/BANDAGES/DRESSINGS) ×2 IMPLANT
ELECT REM PT RETURN 9FT ADLT (ELECTROSURGICAL) ×2
ELECTRODE REM PT RTRN 9FT ADLT (ELECTROSURGICAL) ×1 IMPLANT
EXCISOR BIOPSY CONE MED FISHER (MISCELLANEOUS) ×1 IMPLANT
GLOVE BIOGEL PI IND STRL 7.0 (GLOVE) ×3 IMPLANT
GLOVE BIOGEL PI INDICATOR 7.0 (GLOVE) ×3
GLOVE ECLIPSE 6.5 STRL STRAW (GLOVE) ×2 IMPLANT
GOWN STRL REUS W/ TWL LRG LVL3 (GOWN DISPOSABLE) ×2 IMPLANT
GOWN STRL REUS W/TWL LRG LVL3 (GOWN DISPOSABLE) ×2
NS IRRIG 1000ML POUR BTL (IV SOLUTION) ×2 IMPLANT
PAD OB MATERNITY 4.3X12.25 (PERSONAL CARE ITEMS) ×2 IMPLANT
PAD PREP 24X48 CUFFED NSTRL (MISCELLANEOUS) ×2 IMPLANT
RETRACTOR WND ALEXIS 25 LRG (MISCELLANEOUS) IMPLANT
RTRCTR WOUND ALEXIS 25CM LRG (MISCELLANEOUS)
STAPLER VISISTAT 35W (STAPLE) ×1 IMPLANT
STRIP CLOSURE SKIN 1/2X4 (GAUZE/BANDAGES/DRESSINGS) ×2 IMPLANT
SUT MNCRL 0 VIOLET CTX 36 (SUTURE) IMPLANT
SUT MON AB 2-0 SH 27 (SUTURE) ×3
SUT MON AB 2-0 SH27 (SUTURE) IMPLANT
SUT MONOCRYL 0 CTX 36 (SUTURE) ×1
SUT PDS AB 0 CTX 60 (SUTURE) ×2 IMPLANT
SUT PLAIN 2 0 XLH (SUTURE) ×2 IMPLANT
SUT VIC AB 0 CT1 36 (SUTURE) ×4 IMPLANT
SUT VIC AB 2-0 CT1 27 (SUTURE) ×1
SUT VIC AB 2-0 CT1 TAPERPNT 27 (SUTURE) ×1 IMPLANT
SUT VIC AB 4-0 KS 27 (SUTURE) ×2 IMPLANT
TOWEL OR 17X24 6PK STRL BLUE (TOWEL DISPOSABLE) ×6 IMPLANT
TRAY FOLEY CATH SILVER 16FR (SET/KITS/TRAYS/PACK) ×2 IMPLANT
WATER STERILE IRR 1000ML POUR (IV SOLUTION) ×2 IMPLANT
floseal hemostatic matrix ×1 IMPLANT

## 2021-05-20 NOTE — Op Note (Signed)
Classical cesarean section Op Note ? ?Preoperative Diagnosis:  IUP @ [redacted]w[redacted]d, history of uterine rupture, History of 5 prior cesarean sections ? ?Postoperative Diagnosis:  Same ? ?Procedure: Repeat Classical cesarean section, bilateral salpingectomy ? ?Surgeon: Tinnie Gens, M.D. ? ?Assistant: Warner Mccreedy, MD and Lyndel Safe, MD ? ?An experienced assistant was required given the standard of surgical care given the complexity of the case.  This assistant was needed for exposure, dissection, suctioning, retraction, instrument exchange,  assisting with delivery with administration of fundal pressure, and for overall help during the procedure. ? ?The procedure itself was a more complicated and a longer surgery due to the patient's BMI (43), prior 6 c-section, history of uterine rupture in prior pregnancy with postpartum hemorrhage with PEA arrest, adhesive disease and anterior placenta.  ? ?Findings: Viable female  infant, APGAR (1 MIN): 9   ?APGAR (5 MINS): 9   ?Weight pending, cephalic presentation  ?Normal fallopian tubes bilaterally. Normal Ovaries bilaterally,  ?Upon entry to the abdomen and visualization of the lower uterine segment there was hypervascularity and a uterine window with the anterior placenta visualized.  ? ?Estimated blood loss: 1330 cc ? ?Complications: Vertical uterine incision, thin lower uterine/uterine window with placental vessels present ?Specimens: Placenta to labor and delivery ? ?Reason for procedure: Briefly, the patient is a 31 y.o. [id number] [redacted]w[redacted]d who presents for scheduled repeat cesarean section. Patient has history of 6 prior cesarean sections.  ? ?Procedure: Patient was to the OR where spinal analgesia was administered. She was then placed in a supine position with left lateral tilt. She received Clindamycin and Gentamicin for surgical prophylaxis (in setting of PCN allergy) and SCDs were in place. A timeout was performed. She was prepped and draped in the usual sterile fashion. A  Foley catheter was placed in the bladder. A knife was then used to make a vertical skin incision along edge of prior vertical skin incision scar. This incision was carried out to underlying fascia which was divided in the midline vertically.  ? ? The peritoneal cavity was entered bluntly.  Alexis retractor was placed inside the incision.  A knife was used to make a vertical uterine (classical) incision. This incision was carried down to the amniotic cavity was entered. The anterior placenta was encountered and fetus was in cephalic position and was brought up out of the incision without difficulty. After 1 minute delay the cord was clamped and cut. Infant taken to waiting nurse.  Cord blood was obtained. Placenta was delivered from the uterus.  Uterus was cleaned with dry lap pads.  ? ?At that time the vertical uterine incision closed with 0 Monocryl suture in a locked running fashion starting cephalic and run in caudal direction. There was a horizontal extension noted likely at site of prior uterine rupture and a second 0 monocryl used to close defect and then run to close rest of the uterine opening.  ? ?A second imbricating layer also placed with 0 Monocryl at the top part of uterine incision however due to very thin area of lower part of incision the imbricating layer not done. A third layer of baseball stitch done with 0-Monocryl suture done to close the rest of the defect. ? ?Attention was then turned to the fallopian tubes. Bilateral salpingectomy: A Kelly clamp was placed across the left fallopian tube taking care to incorporate the fimbriae. A second clamp was then placed below the first. The fallopian tube was then removed with Metzenbaum scissors. The pedicle was then ligated with  2-0 Monocryl suture and the second clamp was removed with excellent hemostasis noted. Then a second ligature of 0 Monocryl suture was placed below the remaining clamp, the clamp was then removed and again excellent hemostasis  was observed. The same procedure was then carried out on the right fallopian tube. At that time bleeding was noted at site of right fallopian tube and multiple figure of eight stitches were done with monocryl. Floseal was also applied and appropriate hemostasis noted. ? ?At that time attention was placed again on the hysterotomy and there was an area of bleeding in the lower edge and figure of eight stitches were used. Ultimately floseal was applied to the area of hysterotomy with bleeding and hemostasis noted. ? ?Alexis retractor was removed from the abdomen.  Fascia was closed with 0 PDS looped in a running fashion.  Subcutaneous closure was performed with 0 vicryl. Skin closed using staples and honeycomb dressing applied on top.  All instrument, needle and lap counts were correct x 2.  Patient was awake and taken to PACU stable.  Infant remained with mom and is stable.  ? ?Park Breed DasMD ?05/20/2021 ?5:55 PM ? ?I was present for the entirety of this procedure, gloved and immediately available during the procedure.  This procedure was done under my direct supervision. ? ? ?

## 2021-05-20 NOTE — Anesthesia Procedure Notes (Signed)
Spinal ? ?Patient location during procedure: OR ?Start time: 05/20/2021 2:55 PM ?End time: 05/20/2021 3:00 PM ?Reason for block: surgical anesthesia ?Staffing ?Anesthesiologist: Leilani Able, MD ?Preanesthetic Checklist ?Completed: patient identified, IV checked, site marked, risks and benefits discussed, surgical consent, monitors and equipment checked, pre-op evaluation and timeout performed ?Spinal Block ?Patient position: sitting ?Prep: DuraPrep ?Patient monitoring: cardiac monitor, continuous pulse ox, blood pressure and heart rate ?Approach: midline ?Location: L3-4 ?Injection technique: catheter ?Needle ?Needle type: Tuohy and Sprotte  ?Needle gauge: 24 G ?Needle length: 12.7 cm ?Needle insertion depth: 7 cm ?Catheter type: closed end flexible ?Catheter size: 19 g ?Catheter at skin depth: 12 cm ?Assessment ?Sensory level: T4 ?Events: CSF return ? ? ? ?

## 2021-05-20 NOTE — Discharge Summary (Signed)
? ?  Postpartum Discharge Summary ? ?   ?Patient Name: Cheryl Fischer ?DOB: [birth year] ?MRN: [id number] ? ?Date of admission: 05/20/2021 ?Delivery date:05/20/2021  ?Delivering provider: Donnamae Jude  ?Date of discharge: 05/22/2021 ? ?Admitting diagnosis: S/P repeat low transverse C-section [T88.828] ?Intrauterine pregnancy: [redacted]w[redacted]d     ?Secondary diagnosis:  Active Problems: ?  history of cesarean section x 5 ?  Obesity during pregnancy ?  History of uterine rupture ?  History of cardiac arrest ?  Supervision of high risk pregnancy, antepartum ?  History of gestational diabetes in prior pregnancy, currently pregnant ?  Repeat classical cesarean section ?  Status post bilateral salpingectomy ?  History of postpartum hemorrhage ?  Postoperative anemia due to acute blood loss ? ?Additional problems: None    ?Discharge diagnosis: Term Pregnancy Delivered                                              ?Post partum procedures: None ?Augmentation: N/A ?Complications: None ? ?Hospital course: Sceduled C/S   31 y.o. yo [id number] at [redacted]w[redacted]d was admitted to the hospital 05/20/2021 for scheduled cesarean section with the following indication: repeat cesarean section .Delivery details are as follows:  ?Membrane Rupture Time/Date: 3:30 PM ,05/20/2021   ?Delivery Method:C-Section, Classical  ?Details of operation can be found in separate operative note.  Patient had an uncomplicated postpartum course.  She is ambulating, tolerating a regular diet, passing flatus, and urinating well. Patient is discharged home in stable condition on  05/22/21 ?       ?Newborn Data: ?Birth date:05/20/2021  ?Birth time:3:31 PM  ?Gender:Female  ?Living status:Living  ?Apgars:9 ,9  ?Weight:2210 g    ? ?Magnesium Sulfate received: No ?BMZ received: No ?Rhophylac:N/A ?MMR:N/A ?T-DaP:Given prenatally ?Flu: Yes ?Transfusion:No ? ?Physical exam  ?Vitals:  ? 05/21/21 0803 05/21/21 1445 05/21/21 2035 05/22/21 0441  ?BP: (!) 112/56 101/64 (!) 97/58 121/64  ?Pulse: 75 (!) 58 62 67   ?Resp:  $Remov'17 19 18  'wbRNzy$ ?Temp: 97.8 ?F (36.6 ?C) 97.8 ?F (36.6 ?C) 98.4 ?F (36.9 ?C) (!) 97.5 ?F (36.4 ?C)  ?TempSrc: Oral Oral Oral Oral  ?SpO2: 98% 96% 98% 99%  ?Weight:      ?Height:      ? ?General: alert, cooperative, and no distress ?Lochia: appropriate ?Uterine Fundus: firm ?Incision: Healing well with no significant drainage, No significant erythema, Dressing is clean, dry, and intact ?DVT Evaluation: No evidence of DVT seen on physical exam. Negative Homan's sign. ?No cords or calf tenderness. ? ?Labs: ?Lab Results  ?Component Value Date  ? WBC 12.8 (H) 05/21/2021  ? HGB 9.1 (L) 05/21/2021  ? HCT 26.7 (L) 05/21/2021  ? MCV 92.7 05/21/2021  ? PLT 227 05/21/2021  ? ? ?  Latest Ref Rng & Units 05/20/2021  ?  6:53 PM  ?CMP  ?Creatinine 0.44 - 1.00 mg/dL 0.74    ? ?Edinburgh Score: ? ?  05/21/2021  ? 11:50 AM  ?Flavia Shipper Postnatal Depression Scale Screening Tool  ?I have been able to laugh and see the funny side of things. 0  ?I have looked forward with enjoyment to things. 0  ?I have blamed myself unnecessarily when things went wrong. 0  ?I have been anxious or worried for no good reason. 0  ?I have felt scared or panicky for no good reason. 0  ?Things have been getting  on top of me. 0  ?I have been so unhappy that I have had difficulty sleeping. 0  ?I have felt sad or miserable. 0  ?I have been so unhappy that I have been crying. 0  ?The thought of harming myself has occurred to me. 0  ?Edinburgh Postnatal Depression Scale Total 0  ? ? ? ?After visit meds:  ?Allergies as of 05/22/2021   ? ?   Reactions  ? Penicillins Hives, Shortness Of Breath, Swelling  ? Did it involve swelling of the face/tongue/throat, SOB, or low BP? Yes ?Did it involve sudden or severe rash/hives, skin peeling, or any reaction on the inside of your mouth or nose? Yes ?Did you need to seek medical attention at a hospital or doctor's office? Unknown ?When did it last happen? Childhood reaction      ?If all above answers are ?NO?, may proceed with  cephalosporin use.  ? ?  ? ?  ?Medication List  ?  ? ?STOP taking these medications   ? ?aspirin EC 81 MG tablet ?  ? ?  ? ?TAKE these medications   ? ?ferrous gluconate 324 MG tablet ?Commonly known as: FERGON ?Take 1 tablet (324 mg total) by mouth daily with breakfast. ?  ?ibuprofen 600 MG tablet ?Commonly known as: ADVIL ?Take 1 tablet (600 mg total) by mouth every 6 (six) hours as needed for moderate pain, mild pain or cramping. ?  ?oxyCODONE 5 MG immediate release tablet ?Commonly known as: Oxy IR/ROXICODONE ?Take 1 tablet (5 mg total) by mouth every 6 (six) hours as needed for severe pain or breakthrough pain. ?  ?PRENATAL VITAMIN PO ?Take 1 tablet by mouth daily. ?  ?senna-docusate 8.6-50 MG tablet ?Commonly known as: Senokot-S ?Take 2 tablets by mouth at bedtime as needed for mild constipation or moderate constipation. ?  ? ?  ? ?  ?  ? ? ?  ?Discharge Care Instructions  ?(From admission, onward)  ?  ? ? ?  ? ?  Start     Ordered  ? 05/22/21 0000  Discharge wound care:       ?Comments: As per discharge handout and nursing instructions  ? 05/22/21 1202  ? ?  ?  ? ?  ? ? ? ?Discharge home in stable condition ?Infant Feeding: Bottle ?Infant Disposition:home with mother ?Discharge instruction: per After Visit Summary and Postpartum booklet. ?Activity: Advance as tolerated. Pelvic rest for 6 weeks.  ?Diet: routine diet ?Future Appointments  ?Date Time Provider [place]  ?05/28/2021 10:35 AM Griffin Basil, MD [facility] None  ?06/22/2021 10:55 AM Chancy Milroy, MD [facility] None  ? ?Follow up Visit: ?Message sent to [facility] by Dr. Cy Blamer on 4/5 ? ?Please schedule this patient for a In person postpartum visit in 4 weeks with the following provider: MD. ?Additional Postpartum F/U:Incision check and staple removal in 1 week  ?High risk pregnancy complicated by:  history of 5 prior CS and hx of prior uterine rupture ?Delivery mode:  C-Section, Classical  ?Anticipated Birth Control:  BTL done  PP ? ? ?05/22/2021 ?Verita Schneiders, MD ? ? ? ?

## 2021-05-20 NOTE — Progress Notes (Signed)
Pt's urine output from 2030-2235 only 31ml. Has ordered pitocin running at 41.60ml/hr. Pt tolerating po fluids well, VSS, tolerated orthostatic BPs well and standing at the bedside. Notified Dr. Eulas Post of urine output. No new orders at this time. Will continue to monitor.  ?

## 2021-05-20 NOTE — Transfer of Care (Signed)
Immediate Anesthesia Transfer of Care Note ? ?Patient: Cheryl Fischer ? ?Procedure(s) Performed: CESAREAN SECTION WITH BILATERAL TUBAL LIGATION (Bilateral) ? ?Patient Location: PACU ? ?Anesthesia Type:Spinal and Epidural ? ?Level of Consciousness: awake, alert  and oriented ? ?Airway & Oxygen Therapy: Patient Spontanous Breathing ? ?Post-op Assessment: Report given to RN and Post -op Vital signs reviewed and stable ? ?Post vital signs: Reviewed and stable ? ?Last Vitals:  ?Vitals Value Taken Time  ?BP 106/82 05/20/21 1700  ?Temp 36.8 ?C 05/20/21 1700  ?Pulse 65 05/20/21 1706  ?Resp 20 05/20/21 1706  ?SpO2 97 % 05/20/21 1706  ?Vitals shown include unvalidated device data. ? ?Last Pain:  ?Vitals:  ? 05/20/21 1700  ?TempSrc: Oral  ?   ? ?  ? ?Complications: No notable events documented. ?

## 2021-05-20 NOTE — Anesthesia Preprocedure Evaluation (Signed)
Anesthesia Evaluation  ?Patient identified by MRN, date of birth, ID band ?Patient awake ? ? ? ?Airway ?Mallampati: II ? ? ? ? ? ? Dental ?no notable dental hx. ? ?  ?Pulmonary ?neg pulmonary ROS,  ?  ?Pulmonary exam normal ? ? ? ? ? ? ? Cardiovascular ?negative cardio ROS ?Normal cardiovascular exam ? ? ?  ?Neuro/Psych ?negative neurological ROS ? negative psych ROS  ? GI/Hepatic ?negative GI ROS, Neg liver ROS,   ?Endo/Other  ?Morbid obesity ? Renal/GU ?negative Renal ROS  ?negative genitourinary ?  ?Musculoskeletal ?negative musculoskeletal ROS ?(+)  ? Abdominal ?(+) + obese,   ?Peds ? Hematology ? ?(+) Blood dyscrasia, anemia ,   ?Anesthesia Other Findings ? ? Reproductive/Obstetrics ?(+) Pregnancy ? ?  ? ? ? ? ? ? ? ? ? ? ? ? ? ?  ?  ? ? ? ? ? ? ? ? ?Anesthesia Physical ?Anesthesia Plan ? ?ASA: 3 ? ?Anesthesia Plan: Combined Spinal and Epidural  ? ?Post-op Pain Management: Minimal or no pain anticipated  ? ?Induction:  ? ?PONV Risk Score and Plan: 3 and Ondansetron, Dexamethasone and Scopolamine patch - Pre-op ? ?Airway Management Planned: Natural Airway and Simple Face Mask ? ?Additional Equipment: None ? ?Intra-op Plan:  ? ?Post-operative Plan:  ? ?Informed Consent: I have reviewed the patients History and Physical, chart, labs and discussed the procedure including the risks, benefits and alternatives for the proposed anesthesia with the patient or authorized representative who has indicated his/her understanding and acceptance.  ? ? ? ? ? ?Plan Discussed with: CRNA ? ?Anesthesia Plan Comments:   ? ? ? ? ? ? ?Anesthesia Quick Evaluation ? ?

## 2021-05-20 NOTE — H&P (Signed)
?OBSTETRIC ADMISSION HISTORY AND PHYSICAL ? ?JAHAYRA MAZO is a 31 y.o. female [phone] with IUP at [redacted]w[redacted]d by [place] presenting for scheduled repeat cesarean section (hx of 5 prior CS and uterine rupture). She reports +FMs, No LOF, no VB, no blurry vision, headaches or peripheral edema, and RUQ pain.  She plans on formula feeding. She is desires BTL for birth control. Her main concern is that with her history of multiple prior CS there a chance with the scarring that it would be challenging to identify and remove the tubes. She is considering Nexplanon as an alternative ? ?She received her prenatal care at  [facility]   ? ?Dating: By early YS --->  Estimated Date of Delivery: 06/17/21 ? ?Sono:   ? ?@[redacted]w[redacted]d , CWD, normal anatomy, cephalic presentation, anterior fundal placental lie, 2243g, 11% EFW ? ? 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 Anemia   ? Blood transfusion without reported diagnosis   ? second set of twins  ? Hernia of abdominal wall   ? Neonatal death 11/02/18  ? 10/23/2018 at 38wks>hemorrhagic shock, PEA arrest, neonatal demise  ? Respiratory failure (HCC)   ? Vaginal Pap smear, abnormal   ? ? ?Past Surgical History: ?Past Surgical History:  ?Procedure Laterality Date  ? CESAREAN SECTION  05/01/2010  ? CESAREAN SECTION N/A November 02, 2018  ? Procedure: CESAREAN SECTION;  Surgeon: 10/14/2018, MD;  Location: [facility] LD ORS;  Service: Obstetrics;  Laterality: N/A;  ? CESAREAN SECTION  06/24/2011  ? CESAREAN SECTION  12/27/2014  ? CESAREAN SECTION  03/12/2017  ? CESAREAN SECTION  12/29/2015  ? ? ?Obstetrical History: ?OB History   ? ? Gravida  ?7  ? Para  ?6  ? Term  ?5  ? Preterm  ?1  ? AB  ?   ? Living  ?7  ?  ? ? SAB  ?   ? IAB  ?   ? Ectopic  ?   ? Multiple  ?2  ?  Live Births  ?7  ?   ?  ? Obstetric Comments  ?Twins  ?  ? ?  ? ? ?Social History ?Social History  ? ?Socioeconomic History  ? Marital status: Single  ?  Spouse name: Not on file  ? Number of children: 7  ? Years of education: Not on file  ? Highest education level: Not on file  ?Occupational History  ? Not on file  ?Tobacco Use  ? Smoking status: Never  ? Smokeless tobacco: Never  ?Vaping Use  ? Vaping Use: Never used  ?Substance and Sexual Activity  ? Alcohol use: Never  ? Drug use: Never  ? Sexual activity: Yes  ?  Birth control/protection: None  ?  Comment: currently pregnant  ?Other Topics Concern  ? Not on file  ?Social History Narrative  ? Not on file  ? ?Social Determinants of Health  ? ?Financial Resource Strain: Not on file  ?Food Insecurity: Not on file  ?Transportation Needs: Not on file  ?Physical Activity: Not on file  ?Stress: Not on file  ?Social Connections: Not on file  ? ? ?Family History: ?Family History  ?Problem Relation Age of Onset  ? Diabetes Maternal Grandmother   ? Cancer Paternal Grandmother   ? ? ?Allergies: ?Allergies  ?Allergen Reactions  ?  Penicillins Hives, Shortness Of Breath and Swelling  ?  Did it involve swelling of the face/tongue/throat, SOB, or low BP? Yes ?Did it involve sudden or severe rash/hives, skin peeling, or any reaction on the inside of your mouth or nose? Yes ?Did you need to seek medical attention at a hospital or doctor's office? Unknown ?When did it last happen? Childhood reaction      ?If all above answers are ?NO?, may proceed with cephalosporin use. ?  ? ? ?Medications Prior to Admission  ?Medication Sig Dispense Refill Last Dose  ? aspirin EC 81 MG tablet Take 1 tablet (81 mg total) by mouth daily. Take after 12 weeks for prevention of preeclampsia later in pregnancy 300 tablet 2 05/19/2021  ? Prenatal Vit-Fe Fumarate-FA (PRENATAL VITAMIN PO) Take 1 tablet by mouth daily.   05/19/2021  ? ? ? ?Review of Systems  ? ?All systems reviewed and negative except as  stated in HPI ? ?Blood pressure 126/86, pulse 77, temperature 97.8 ?F (36.6 ?C), temperature source Oral, resp. rate 18, height 5\' 2"  (1.575 m), weight 238 lb (108 kg), last menstrual period 09/10/2020, SpO2 96 %. ?General appearance: alert, no acute distress ?Lungs: clear to auscultation bilaterally ?Heart: regular rate and rhythm ?Abdomen: soft, non-tender; bowel sounds normal ?Extremities: Homans sign is negative, no sign of DVT ? ? ?Prenatal labs: ?ABO, Rh: --/--/O POS (04/03 1027) ?Antibody: NEG (04/03 1027) ?Rubella: 1.69 (11/07 1550) ?RPR: NON REACTIVE (04/03 1027)  ?HBsAg: Negative (11/07 1550)  ?HIV: Non Reactive (01/25 0933)  ?GBS: Negative/-- (03/23 1143)  ?2 hr Glucola normal ?Genetic screening  LR NIPS. Neg horizon. Neg AFP ?Anatomy US absent nasal bone ? ?Prenatal Transfer Tool  ?Maternal Diabetes: No ?Genetic Screening: Normal ?Maternal Ultrasounds/Referrals: Absent nasal bone ?Fetal Ultrasounds or other Referrals:  None ?Maternal Substance Abuse:  No ?Significant Maternal Medications:  None ?Significant Maternal Lab Results: Group B Strep negative ? ?Results for orders placed or performed during the hospital encounter of 05/20/21 (from the past 24 hour(s))  ?Prepare RBC (crossmatch)  ? Collection Time: 05/20/21  9:32 AM  ?Result Value Ref Range  ? Order Confirmation    ?  ORDER PROCESSED BY BLOOD BANK ?Performed at [facility], [address] [place], [place] [id number] ?  ? ? ?Patient Active Problem List  ? Diagnosis Date Noted  ? S/P repeat low transverse C-section 05/20/2021  ? Unwanted fertility 03/25/2021  ? Absent fetal nasal bone 02/18/2021  ? BMI 40.0-44.9, adult (HCC) 02/18/2021  ? History of gestational diabetes in prior pregnancy, currently pregnant 12/22/2020  ? Supraumbilical hernia 12/22/2020  ? Supervision of high risk pregnancy, antepartum 11/28/2020  ? Hemorrhagic shock (HCC) 10/12/2018  ? History of uterine rupture 10/12/2018  ? History of cardiac arrest   ? Obesity during  pregnancy 08/17/2018  ? history of cesarean section x 5 04/24/2018  ? ? ?Assessment/Plan:  ?Markham Jordanina M Zellman is a 31 y.o. [phone] at 5921w0d here for schedule repeat CS and BTL ? ?#Schedule repeat CS ?#Undesired fertility ?Patient with 5 prior CS and history of uterine rupture in 2020 (complicated by PEA arrest and neonatal demise) ?The risks of cesarean section were discussed with the patient including but were not limited to: bleeding which may require transfusion or reoperation; infection which may require antibiotics; injury to bowel, bladder, ureters or other surrounding organs; injury to the fetus; need for additional procedures including hysterectomy in the event of a life-threatening hemorrhage; placental abnormalities wth subsequent pregnancies, incisional problems, thromboembolic  phenomenon and other postoperative/anesthesia complications.  Patient also desires permanent sterilization.  Other reversible forms of contraception were discussed with patient; she declines all other modalities. Risks of procedure discussed with patient including but not limited to: risk of regret, permanence of method, bleeding, infection, injury to surrounding organs and need for additional procedures.  Failure risk of about 1% with increased risk of ectopic gestation if pregnancy occurs was also discussed with patient.  Also discussed possibility of post-tubal pain syndrome. The patient concurred with the proposed plan, giving informed written consent for the procedures.  Patient has been NPO since last night she will remain NPO for procedure. Anesthesia and OR aware.  Preoperative prophylactic antibiotics and SCDs ordered on call to the OR.  To OR when ready. ? ? ?#Pain: spinal ?#FWB:  FHTs WNL ?#ID:  GBS neg. Will receive surgical ppx ?#MOF: breast ?#MOC: BTL ?#Circ:  N/A ? ? ?#Supraumbilical hernia ?Planned for post partum repair. Per chart review referral placed to central [place] [facility] on 1/4 ? ?Warner Mccreedy, MD  ?05/20/2021,  12:30 PM ? ? ? ?

## 2021-05-21 ENCOUNTER — Ambulatory Visit: Payer: Medicaid Other

## 2021-05-21 ENCOUNTER — Encounter (HOSPITAL_COMMUNITY): Payer: Self-pay | Admitting: Family Medicine

## 2021-05-21 DIAGNOSIS — Z8759 Personal history of other complications of pregnancy, childbirth and the puerperium: Secondary | ICD-10-CM

## 2021-05-21 LAB — CBC
HCT: 26.7 % — ABNORMAL LOW (ref 36.0–46.0)
HCT: 28.7 % — ABNORMAL LOW (ref 36.0–46.0)
Hemoglobin: 9.1 g/dL — ABNORMAL LOW (ref 12.0–15.0)
Hemoglobin: 9.9 g/dL — ABNORMAL LOW (ref 12.0–15.0)
MCH: 31.6 pg (ref 26.0–34.0)
MCH: 32 pg (ref 26.0–34.0)
MCHC: 34.1 g/dL (ref 30.0–36.0)
MCHC: 34.5 g/dL (ref 30.0–36.0)
MCV: 92.7 fL (ref 80.0–100.0)
MCV: 92.9 fL (ref 80.0–100.0)
Platelets: 227 10*3/uL (ref 150–400)
Platelets: 241 10*3/uL (ref 150–400)
RBC: 2.88 MIL/uL — ABNORMAL LOW (ref 3.87–5.11)
RBC: 3.09 MIL/uL — ABNORMAL LOW (ref 3.87–5.11)
RDW: 13.5 % (ref 11.5–15.5)
RDW: 13.6 % (ref 11.5–15.5)
WBC: 12.8 10*3/uL — ABNORMAL HIGH (ref 4.0–10.5)
WBC: 14.9 10*3/uL — ABNORMAL HIGH (ref 4.0–10.5)
nRBC: 0 % (ref 0.0–0.2)
nRBC: 0 % (ref 0.0–0.2)

## 2021-05-21 MED ORDER — SODIUM CHLORIDE 0.9 % IV SOLN
500.0000 mg | Freq: Once | INTRAVENOUS | Status: AC
  Administered 2021-05-21: 500 mg via INTRAVENOUS
  Filled 2021-05-21: qty 25

## 2021-05-21 MED ORDER — LACTATED RINGERS IV BOLUS
500.0000 mL | Freq: Once | INTRAVENOUS | Status: AC
  Administered 2021-05-21: 500 mL via INTRAVENOUS

## 2021-05-21 MED ORDER — FUROSEMIDE 10 MG/ML IJ SOLN
10.0000 mg | Freq: Once | INTRAMUSCULAR | Status: AC
  Administered 2021-05-21: 10 mg via INTRAVENOUS
  Filled 2021-05-21: qty 4

## 2021-05-21 NOTE — Anesthesia Postprocedure Evaluation (Signed)
Anesthesia Post Note ? ?Patient: Cheryl Fischer ? ?Procedure(s) Performed: CESAREAN SECTION WITH BILATERAL TUBAL LIGATION (Bilateral) ? ?  ? ?Patient location during evaluation: PACU ?Anesthesia Type: Combined Spinal/Epidural ?Level of consciousness: awake ?Pain management: pain level controlled ?Vital Signs Assessment: post-procedure vital signs reviewed and stable ?Respiratory status: spontaneous breathing ?Cardiovascular status: stable ?Postop Assessment: no headache, no backache, spinal receding, patient able to bend at knees and no apparent nausea or vomiting ?Anesthetic complications: no ? ? ?No notable events documented. ? ?Last Vitals:  ?Vitals:  ? 05/21/21 0545 05/21/21 0803  ?BP: 102/73 (!) 112/56  ?Pulse: (!) 57 75  ?Resp: 18   ?Temp: (!) 36.4 ?C 36.6 ?C  ?SpO2: 98% 98%  ?  ?Last Pain:  ?Vitals:  ? 05/21/21 0951  ?TempSrc:   ?PainSc: 8   ? ? ?  ?  ?  ?  ?  ?  ? ?Caren Macadam ? ? ? ? ?

## 2021-05-21 NOTE — Progress Notes (Signed)
Patients IV infiltrated with IV iron running. Her IV site is bruised, warm, painful, and swollen. IV iron stopped and although IV flushed, RN was unable to see if additional swelling was noted with flush so IV was removed. MD called regarding PO iron vs another IV insertion because patient would prefer to not get stuck again but would if absolutely necessary. MD will place orders for PO iron instead. Royston Cowper, RN ? ?

## 2021-05-21 NOTE — Progress Notes (Signed)
POSTPARTUM PROGRESS NOTE ? ?POD #1 ? ?Subjective: ? ?Cheryl Fischer is a 31 y.o. [phone] s/p repeat classical cesarean section at [redacted]w[redacted]d. No acute events overnight. She reports she is doing well. She denies any problems with ambulating, voiding or po intake. Denies nausea or vomiting. She has not passed flatus. Pain is well controlled.  Lochia is appropriate. Foley catheter remains in place. 310 mL clear urine emptied at 04/05 at 1901 and 200 mL dark urine in bag this morning.  ? ?Objective: ?Blood pressure 102/73, pulse (!) 57, temperature (!) 97.5 ?F (36.4 ?C), temperature source Oral, resp. rate 18, height 5\' 2"  (1.575 m), weight 108 kg, last menstrual period 09/10/2020, SpO2 98 %, unknown if currently breastfeeding. ? ?Physical Exam:  ?General: alert, cooperative and no distress ?Chest: no respiratory distress ?Heart: regular rate, distal pulses intact ?Uterine Fundus: firm, appropriately tender ?DVT Evaluation: No calf swelling or tenderness ?Extremities: no edema ?Skin: warm, dry; incision clean/dry/intact w/ honeycomb dressing in place ? ?Recent Labs  ?  05/21/21 ?07/21/21 05/21/21 ?07/21/21  ?HGB 9.9* 9.1*  ?HCT 28.7* 26.7*  ? ? ?Assessment/Plan: ?Cheryl Fischer is a 31 y.o. [phone] s/p recurrent classical cesarean section at [redacted]w[redacted]d due to history of 5 prior cesarean sections and uterine rupture. ? ?#POD#1 - Doing welll; pain well controlled. H/H appropriate ?-Routine postpartum care ?-OOB, ambulated ?-Lovenox for VTE prophylaxis ? ?#Anemia: asymptomatic ?-Hgb 11.6 > 9.9 > 9.1 ?-Start IV venofer ? ?#Decreased urine output ?-310 mL clear urine emptied at 1901 with 50 mL urine from 2030 to 2235. Two fluid boluses administered. 200 mL dark urine in bag this morning. ?-Start 10 mg Lasix x 1 ? ?Contraception: BTL ?Feeding: bottle feeding ? ?Dispo: Plan for discharge 05/22/21. ? ? LOS: 1 day  ? ?05/21/2021, 8:00 AM  ? ?

## 2021-05-22 DIAGNOSIS — D62 Acute posthemorrhagic anemia: Secondary | ICD-10-CM | POA: Diagnosis present

## 2021-05-22 LAB — TYPE AND SCREEN
ABO/RH(D): O POS
Antibody Screen: NEGATIVE
Unit division: 0
Unit division: 0

## 2021-05-22 LAB — BPAM RBC
Blood Product Expiration Date: 202305032359
Blood Product Expiration Date: 202305042359
Unit Type and Rh: 5100
Unit Type and Rh: 5100

## 2021-05-22 LAB — SURGICAL PATHOLOGY

## 2021-05-22 MED ORDER — OXYCODONE HCL 5 MG PO TABS: 5.0000 mg | ORAL_TABLET | Freq: Four times a day (QID) | ORAL | 0 refills | Status: DC | PRN

## 2021-05-22 MED ORDER — FERROUS GLUCONATE 324 (38 FE) MG PO TABS: 324.0000 mg | ORAL_TABLET | Freq: Every day | ORAL | 3 refills | Status: AC

## 2021-05-22 MED ORDER — SENNOSIDES-DOCUSATE SODIUM 8.6-50 MG PO TABS: 2.0000 | ORAL_TABLET | Freq: Every evening | ORAL | 2 refills | Status: AC | PRN

## 2021-05-22 MED ORDER — IBUPROFEN 600 MG PO TABS: 600.0000 mg | ORAL_TABLET | Freq: Four times a day (QID) | ORAL | 2 refills | Status: DC | PRN

## 2021-05-22 NOTE — Progress Notes (Signed)
CSW updated MOB about CPS report.  MOB expressed appreciation and apologized to CSW for her behaviors during the clinical assessment. CSW accepted MOB' s apology and encouraged MOB to be the great mom to infant.  ? ?Janielle Mittelstadt Boyd-Gilyard, MSW, LCSW ?Clinical Social Work ?[phone] ?

## 2021-05-22 NOTE — Clinical Social Work Maternal (Addendum)
?CLINICAL SOCIAL WORK MATERNAL/CHILD NOTE ? ?Patient Details  ?Name: Cheryl Fischer ?MRN: [id number] ?Date of Birth: 1990-11-07 ? ?Date:  05/22/2021 ? ?Clinical Social Worker Initiating Note:  Nurse, learning disability Date/Time: Initiated:  05/22/21/1059    ? ?Child's Name:  Darrol   ? ?Biological Parents:  Mother, Father  ? ?Need for Interpreter:  None  ? ?Reason for Referral:  Other (Comment) (FOB incarcerated and questions regarding custody of MOB's older children.)  ? ?Address:  [address] ?[place] [address]  ?  ?Phone number:  [phone] (home)    ? ?Additional phone number:  ? ?Household Members/Support Persons (HM/SP):   Household Member/Support Person 1, Household Member/Support Person 2 (MOB reported that she does not have custody of her older 8 children.  MOB appeared frustrated and did not want to provide the names and DOB of her older children.) ? ? ?HM/SP Name Relationship DOB or Age  ?HM/SP -1 Cheryl Fischer son 03/12/2017  ?HM/SP -2 Cheryl Fischer son 12/27/2015  ?HM/SP -3        ?HM/SP -4        ?HM/SP -5        ?HM/SP -6        ?HM/SP -7        ?HM/SP -8        ? ? ?Natural Supports (not living in the home):  Immediate Family, Extended Family  ? ?Professional Supports: None  ? ?Employment: Unemployed  ? ?Type of Work:    ? ?Education:  High school graduate  ? ?Homebound arranged:   ? ?Financial Resources:  Medicaid  ? ?Other Resources:  Physicist, medical  , [place]  ? ?Cultural/Religious Considerations Which May Impact Care:  None reported ? ?Strengths:  Ability to meet basic needs  , Home prepared for child    ? ?Psychotropic Medications:        ? ?Pediatrician:      ? ?Pediatrician List:  ? ?[place]    ?[place]    ?[facility]    ?[facility]    ?[facility]    ?[facility]    ? ? ?Pediatrician Fax Number:   ? ?Risk Factors/Current Problems:  None  ? ?Cognitive State:  Alert  , Linear Thinking    ? ?Mood/Affect:  Irritable  , Agitated  , Apprehensive    ? ?CSW Assessment: CSW  met with MOB in room 420.  When CSW arrived, MOB was MOB was bonding with infant as evidence about by MOB engaging in skin to skin.  MOB was also on the phone with FOB. CSW offered to return at a later time and MOB declined.  MOB concluded her phone call with FOB.  CSW explained CSW's role and it was apparent by MOB's facial expression and the rolling of MOB's eyes that MOB was not happy to meet with CSW.  MOB reported having all essential items to care for infant including a new car seat and a bassinet.  MOB also acknowledged that FOB was incarcerated and she openly shared his name and date of birth. When CSW asked about MOB's older children MOB became more irritated as evidence by raising her voice and declining to answer questions. MOB stated "why are you concerned about my other kids.  They have nothing to do with this baby."  CSW communicated a need to complete the assessment which included the name and dates of birth of MOB's older children. MOB reported that she did not have custody of any  her children and she was unable to recall why.  MOB stated "A few of them have been adopted and a few are with family members."  CSW made MOB aware that CSW will make a CPS report based on MOB's CPS hx. A report was made to [place] worker Leeroy Bock.  Per CPS, the case was screened out and CPS will not be providing any resources or supports to family.  CSW updated Peds. ? ?There are no barriers to infant's discharge when infant is medically ready.  ? ?CSW Plan/Description:  No Further Intervention Required/No Barriers to Discharge, Other Patient/Family Education, Child Protective Service Report    ? ?Laurey Arrow, MSW, LCSW ?Clinical Social Work ?[phone] ? ? ?Dimple Nanas, LCSW ?05/22/2021, 11:49 AM ? ?

## 2021-05-28 ENCOUNTER — Encounter: Payer: Self-pay | Admitting: Obstetrics and Gynecology

## 2021-05-28 ENCOUNTER — Ambulatory Visit (INDEPENDENT_AMBULATORY_CARE_PROVIDER_SITE_OTHER): Payer: Medicaid Other | Admitting: Obstetrics and Gynecology

## 2021-05-28 VITALS — BP 120/82 | HR 93 | Ht 62.0 in | Wt 224.0 lb

## 2021-05-28 DIAGNOSIS — Z4802 Encounter for removal of sutures: Secondary | ICD-10-CM

## 2021-05-28 NOTE — Progress Notes (Signed)
?  CC: staple removal ?Subjective:  ? ? Patient ID: KILYNN FITZSIMMONS, female    DOB: 1990-12-21, 31 y.o.   MRN: [id number] ? ?HPI ?Pt seen 1 week post delivery for staple removal from midline incision and repeat classical c section.  Pt has done well and has no acute issues. ? ?Review of Systems ? ?   ?Objective:  ? Physical Exam ?Vitals:  ? 05/28/21 1048  ?BP: 120/82  ?Pulse: 93  ? ? ? ? ?   ?Assessment & Plan:  ? ?1. Encounter for staple removal ?All staples removed.  Small areas of wound separation or granulation tissue noted. ?Steristrips applied for stability.  Pt advised to remove in 1 week. ?Pt advised to f/u in 4 weeks for her postpartum exam ? ? ? ? ? ?Warden Fillers, MD ?Faculty Attending, [facility]  ?

## 2021-05-29 ENCOUNTER — Telehealth: Payer: Self-pay | Admitting: Emergency Medicine

## 2021-05-29 NOTE — Telephone Encounter (Signed)
PA sent for Rx. Waiting on correspondence from pharmacy.  ?

## 2021-06-22 ENCOUNTER — Ambulatory Visit: Payer: Medicaid Other | Admitting: Obstetrics and Gynecology

## 2022-06-01 ENCOUNTER — Ambulatory Visit: Payer: Self-pay | Admitting: Surgery

## 2022-06-01 NOTE — H&P (Signed)
Cheryl Fischer [id number]   Referring Provider:  Self   Subjective   Chief Complaint: RE-CHECK (Umbilical hernia - c/o vomiting off and on )     History of Present Illness:    Returns for follow-up.  Reports she is doing well.  The hernia still causing pain, she has had some episodes of vomiting lately.  Did not end up meeting with physical therapy and states that they never called her.  Initial visit 03/05/2022: 32 year old woman with history of anemia, history of neonatal demise in 2020 when she was [redacted] weeks pregnant due to uterine rupture with subsequent hemorrhagic shock and PEA arrest (multiple prior C-sections-2012, 2013, 2016, 2017, 2019, 2020, 2023 tubal ligation) who is referred for an umbilical hernia.  No referral notes available. She states she has had an umbilical hernia for at least 6 years.  It has increased in size over that time period and has become more uncomfortable.  No obstructive symptoms.  She reports she very occasionally smokes.   Review of Systems: A complete review of systems was obtained from the patient.  I have reviewed this information and discussed as appropriate with the patient.  See HPI as well for other ROS.   Medical History: Past Medical History:  Diagnosis Date   Anemia     There is no problem list on file for this patient.   Past Surgical History:  Procedure Laterality Date   LAPAROSCOPIC TUBAL LIGATION Bilateral 2023   CESAREAN SECTION     2012, 2013, 2016, 2017, 2019, 2020, 2023     Allergies  Allergen Reactions   Penicillins Hives, Rash, Shortness Of Breath and Swelling    Did it involve swelling of the face/tongue/throat, SOB, or low BP? Yes  Did it involve sudden or severe rash/hives, skin peeling, or any reaction on the inside of your mouth or nose? Yes  Did you need to seek medical attention at a hospital or doctor's office? Unknown  When did it last happen? Childhood reaction       If all above answers are "NO", may proceed  with cephalosporin use.    Current Outpatient Medications on File Prior to Visit  Medication Sig Dispense Refill   ferrous gluconate (FERGON) 324 MG tablet Take 324 mg by mouth daily with breakfast     No current facility-administered medications on file prior to visit.    History reviewed. No pertinent family history.   Social History   Tobacco Use  Smoking Status Never  Smokeless Tobacco Never     Social History   Socioeconomic History   Marital status: Single  Tobacco Use   Smoking status: Never   Smokeless tobacco: Never  Vaping Use   Vaping status: Never Used  Substance and Sexual Activity   Alcohol use: Defer   Drug use: Defer    Objective:    Vitals:   06/01/22 1103  PainSc: 0-No pain  PainLoc: Abdomen     There is no height or weight on file to calculate BMI.  Gen: A&Ox3, no distress  Unlabored respirations Abdomen is soft, nondistended, nontender.  Well-healed low midline incision.  She has a broad midline diastases, at least 10 cm.  Just superior to the umbilicus there is a partially reducible, tender mass consistent with chronically incarcerated hernia.  Fascial defect is not palpable.  Assessment and Plan:  Diagnoses and all orders for this visit:  Ventral hernia without obstruction or gangrene  Diastasis recti -     Ambulatory Referral to  Physical Therapy   I previously discussed the diagnoses and relevant anatomy with the patient.  Discussed that diastasis would not necessarily be addressed with surgery, but I would recommend physical therapy and consistent exercises to improve the strength of the abdominal wall which will serve her best long-term, in addition to which it will likely improve her outcome following repair of her ventral hernia.  We will resend a referral for this.  Given crescendoing pain and vomiting I do recommend that we proceed with a laparoscopic assisted repair of the chronically incarcerated ventral hernia and I discussed  the procedure with her in detail including risks of bleeding, infection, pain, scarring, injury to intra-abdominal structures, wound healing problems/seroma/hematoma, and hernia recurrence.  We also discussed general cardiovascular/pulmonary/thromboembolic risks of undergoing general anesthesia. Discussed signs and symptoms of bowel incarceration/strangulation that should prompt her to seek emergency treatment in the interim.  Is welcomed and answered to her satisfaction.    Cheryl Fischer Cheryl Grippe, MD

## 2022-06-01 NOTE — H&P (View-Only) (Signed)
Cheryl Fischer [id number]   Referring Provider:  Self   Subjective   Chief Complaint: RE-CHECK (Umbilical hernia - c/o vomiting off and on )     History of Present Illness:    Returns for follow-up.  Reports she is doing well.  The hernia still causing pain, she has had some episodes of vomiting lately.  Did not end up meeting with physical therapy and states that they never called her.  Initial visit 03/05/2022: 32-year-old woman with history of anemia, history of neonatal demise in 2020 when she was [redacted] weeks pregnant due to uterine rupture with subsequent hemorrhagic shock and PEA arrest (multiple prior C-sections-2012, 2013, 2016, 2017, 2019, 2020, 2023 tubal ligation) who is referred for an umbilical hernia.  No referral notes available. She states she has had an umbilical hernia for at least 6 years.  It has increased in size over that time period and has become more uncomfortable.  No obstructive symptoms.  She reports she very occasionally smokes.   Review of Systems: A complete review of systems was obtained from the patient.  I have reviewed this information and discussed as appropriate with the patient.  See HPI as well for other ROS.   Medical History: Past Medical History:  Diagnosis Date   Anemia     There is no problem list on file for this patient.   Past Surgical History:  Procedure Laterality Date   LAPAROSCOPIC TUBAL LIGATION Bilateral 2023   CESAREAN SECTION     2012, 2013, 2016, 2017, 2019, 2020, 2023     Allergies  Allergen Reactions   Penicillins Hives, Rash, Shortness Of Breath and Swelling    Did it involve swelling of the face/tongue/throat, SOB, or low BP? Yes  Did it involve sudden or severe rash/hives, skin peeling, or any reaction on the inside of your mouth or nose? Yes  Did you need to seek medical attention at a hospital or doctor's office? Unknown  When did it last happen? Childhood reaction       If all above answers are "NO", may proceed  with cephalosporin use.    Current Outpatient Medications on File Prior to Visit  Medication Sig Dispense Refill   ferrous gluconate (FERGON) 324 MG tablet Take 324 mg by mouth daily with breakfast     No current facility-administered medications on file prior to visit.    History reviewed. No pertinent family history.   Social History   Tobacco Use  Smoking Status Never  Smokeless Tobacco Never     Social History   Socioeconomic History   Marital status: Single  Tobacco Use   Smoking status: Never   Smokeless tobacco: Never  Vaping Use   Vaping status: Never Used  Substance and Sexual Activity   Alcohol use: Defer   Drug use: Defer    Objective:    Vitals:   06/01/22 1103  PainSc: 0-No pain  PainLoc: Abdomen     There is no height or weight on file to calculate BMI.  Gen: A&Ox3, no distress  Unlabored respirations Abdomen is soft, nondistended, nontender.  Well-healed low midline incision.  She has a broad midline diastases, at least 10 cm.  Just superior to the umbilicus there is a partially reducible, tender mass consistent with chronically incarcerated hernia.  Fascial defect is not palpable.  Assessment and Plan:  Diagnoses and all orders for this visit:  Ventral hernia without obstruction or gangrene  Diastasis recti -     Ambulatory Referral to   Physical Therapy   I previously discussed the diagnoses and relevant anatomy with the patient.  Discussed that diastasis would not necessarily be addressed with surgery, but I would recommend physical therapy and consistent exercises to improve the strength of the abdominal wall which will serve her best long-term, in addition to which it will likely improve her outcome following repair of her ventral hernia.  We will resend a referral for this.  Given crescendoing pain and vomiting I do recommend that we proceed with a laparoscopic assisted repair of the chronically incarcerated ventral hernia and I discussed  the procedure with her in detail including risks of bleeding, infection, pain, scarring, injury to intra-abdominal structures, wound healing problems/seroma/hematoma, and hernia recurrence.  We also discussed general cardiovascular/pulmonary/thromboembolic risks of undergoing general anesthesia. Discussed signs and symptoms of bowel incarceration/strangulation that should prompt her to seek emergency treatment in the interim.  Is welcomed and answered to her satisfaction.    Valencia Kassa AMANDA Ashly Yepez, MD   

## 2022-06-11 NOTE — Patient Instructions (Signed)
DUE TO COVID-19 ONLY TWO VISITORS  (aged 32 and older)  ARE ALLOWED TO COME WITH YOU AND STAY IN THE WAITING ROOM ONLY DURING PRE OP AND PROCEDURE.   **NO VISITORS ARE ALLOWED IN THE SHORT STAY AREA OR RECOVERY ROOM!!**  IF YOU WILL BE ADMITTED INTO THE HOSPITAL YOU ARE ALLOWED ONLY FOUR SUPPORT PEOPLE DURING VISITATION HOURS ONLY (7 AM -8PM)   The support person(s) must pass our screening, gel in and out, and wear a mask at all times, including in the patient's room. Patients must also wear a mask when staff or their support person are in the room. Visitors GUEST BADGE MUST BE WORN VISIBLY  One adult visitor may remain with you overnight and MUST be in the room by 8 P.M.     Your procedure is scheduled on: 06/22/22   Report to [facility] Main Entrance    Report to admitting at : 11:15 AM   Call this number if you have problems the morning of surgery [phone]  Eat a light diet the day before surgery.  Examples including soups, broths, toast, yogurt, mashed potatoes.  Things to avoid include carbonated beverages (fizzy beverages), raw fruits and raw vegetables, or beans.   If your bowels are filled with gas, your surgeon will have difficulty visualizing your pelvic organs which increases your surgical risks.  Do not eat food :After Midnight.   After Midnight you may have the following liquids until : 10:30 AM DAY OF SURGERY  Water Black Coffee (sugar ok, NO MILK/CREAM OR CREAMERS)  Tea (sugar ok, NO MILK/CREAM OR CREAMERS) regular and decaf                             Plain Jell-O (NO RED)                                           Fruit ices (not with fruit pulp, NO RED)                                     Popsicles (NO RED)                                                                  Juice: apple, WHITE grape, WHITE cranberry Sports drinks like Gatorade (NO RED)             Oral Hygiene is also important to reduce your risk of infection.                                     Remember - BRUSH YOUR TEETH THE MORNING OF SURGERY WITH YOUR REGULAR TOOTHPASTE  DENTURES WILL BE REMOVED PRIOR TO SURGERY PLEASE DO NOT APPLY "Poly grip" OR ADHESIVES!!!   Do NOT smoke after Midnight   Take these medicines the morning of surgery with A SIP OF WATER: N/A  You may not have any metal on your body including hair pins, jewelry, and body piercing             Do not wear make-up, lotions, powders, perfumes/cologne, or deodorant  Do not wear nail polish including gel and S&S, artificial/acrylic nails, or any other type of covering on natural nails including finger and toenails. If you have artificial nails, gel coating, etc. that needs to be removed by a nail salon please have this removed prior to surgery or surgery may need to be canceled/ delayed if the surgeon/ anesthesia feels like they are unable to be safely monitored.   Do not shave  48 hours prior to surgery.    Do not bring valuables to the hospital. [place].   Contacts, glasses, or bridgework may not be worn into surgery.   Bring small overnight bag day of surgery.   DO NOT [place]. PHARMACY WILL DISPENSE MEDICATIONS LISTED ON YOUR MEDICATION LIST TO YOU DURING YOUR ADMISSION [place]!    Patients discharged on the day of surgery will not be allowed to drive home.  Someone NEEDS to stay with you for the first 24 hours after anesthesia.   Special Instructions: Bring a copy of your healthcare power of attorney and living will documents         the day of surgery if you haven't scanned them before.              Please read over the following fact sheets you were given: IF YOU HAVE QUESTIONS ABOUT YOUR PRE-OP INSTRUCTIONS PLEASE CALL [phone]    [facility] - Preparing for Surgery Before surgery, you can play an important role.  Because skin is not sterile, your skin needs to be as free  of germs as possible.  You can reduce the number of germs on your skin by washing with CHG (chlorahexidine gluconate) soap before surgery.  CHG is an antiseptic cleaner which kills germs and bonds with the skin to continue killing germs even after washing. Please DO NOT use if you have an allergy to CHG or antibacterial soaps.  If your skin becomes reddened/irritated stop using the CHG and inform your nurse when you arrive at Short Stay. Do not shave (including legs and underarms) for at least 48 hours prior to the first CHG shower.  You may shave your face/neck. Please follow these instructions carefully:  1.  Shower with CHG Soap the night before surgery and the  morning of Surgery.  2.  If you choose to wash your hair, wash your hair first as usual with your  normal  shampoo.  3.  After you shampoo, rinse your hair and body thoroughly to remove the  shampoo.                           4.  Use CHG as you would any other liquid soap.  You can apply chg directly  to the skin and wash                       Gently with a scrungie or clean washcloth.  5.  Apply the CHG Soap to your body ONLY FROM THE NECK DOWN.   Do not use on face/ open  Wound or open sores. Avoid contact with eyes, ears mouth and genitals (private parts).                       Wash face,  Genitals (private parts) with your normal soap.             6.  Wash thoroughly, paying special attention to the area where your surgery  will be performed.  7.  Thoroughly rinse your body with warm water from the neck down.  8.  DO NOT shower/wash with your normal soap after using and rinsing off  the CHG Soap.                9.  Pat yourself dry with a clean towel.            10.  Wear clean pajamas.            11.  Place clean sheets on your bed the night of your first shower and do not  sleep with pets. Day of Surgery : Do not apply any lotions/deodorants the morning of surgery.  Please wear clean clothes to the  hospital/surgery center.  FAILURE TO FOLLOW THESE INSTRUCTIONS MAY RESULT IN THE CANCELLATION OF YOUR SURGERY PATIENT SIGNATURE_________________________________  NURSE SIGNATURE__________________________________  ________________________________________________________________________

## 2022-06-14 ENCOUNTER — Inpatient Hospital Stay (HOSPITAL_COMMUNITY): Admission: RE | Admit: 2022-06-14 | Payer: Medicaid Other | Source: Ambulatory Visit

## 2022-06-14 ENCOUNTER — Encounter (HOSPITAL_COMMUNITY)
Admission: RE | Admit: 2022-06-14 | Discharge: 2022-06-14 | Disposition: A | Discharge status: Home / Self Care | Payer: Medicaid Other | Source: Ambulatory Visit | Attending: Anesthesiology | Admitting: Anesthesiology

## 2022-06-14 ENCOUNTER — Other Ambulatory Visit: Payer: Self-pay

## 2022-06-14 ENCOUNTER — Encounter (HOSPITAL_COMMUNITY): Payer: Self-pay

## 2022-06-14 VITALS — Ht 62.0 in | Wt 200.0 lb

## 2022-06-14 DIAGNOSIS — Z8674 Personal history of sudden cardiac arrest: Secondary | ICD-10-CM | POA: Diagnosis not present

## 2022-06-14 DIAGNOSIS — Z01818 Encounter for other preprocedural examination: Secondary | ICD-10-CM | POA: Diagnosis present

## 2022-06-14 HISTORY — DX: Prediabetes: R73.03

## 2022-06-14 NOTE — Progress Notes (Addendum)
Pt. Did not show up today for PST.Several attempts to reach pt. over the phone were tried by RN,unsuccessfully. Pt. Was contacted by PST secretary.Interview was done over the phone,pt. Verbalized understanding of instructions.Labs schedule for 06/15/22 @ 2:00 PM.

## 2022-06-15 ENCOUNTER — Inpatient Hospital Stay (HOSPITAL_COMMUNITY): Admission: RE | Admit: 2022-06-15 | Payer: Medicaid Other | Source: Ambulatory Visit

## 2022-06-16 ENCOUNTER — Encounter (HOSPITAL_COMMUNITY)
Admission: RE | Admit: 2022-06-16 | Discharge: 2022-06-16 | Disposition: A | Discharge status: Home / Self Care | Payer: Medicaid Other | Source: Ambulatory Visit | Attending: Surgery | Admitting: Surgery

## 2022-06-16 VITALS — BP 136/86 | HR 67 | Temp 97.9°F | Resp 18 | Ht 62.0 in | Wt 200.2 lb

## 2022-06-16 DIAGNOSIS — Z01818 Encounter for other preprocedural examination: Secondary | ICD-10-CM | POA: Diagnosis not present

## 2022-06-16 DIAGNOSIS — Z8674 Personal history of sudden cardiac arrest: Secondary | ICD-10-CM

## 2022-06-16 LAB — BASIC METABOLIC PANEL
Anion gap: 7 (ref 5–15)
BUN: 20 mg/dL (ref 6–20)
CO2: 24 mmol/L (ref 22–32)
Calcium: 8.7 mg/dL — ABNORMAL LOW (ref 8.9–10.3)
Chloride: 108 mmol/L (ref 98–111)
Creatinine, Ser: 0.86 mg/dL (ref 0.44–1.00)
GFR, Estimated: >60 mL/min (ref >60)
Glucose, Bld: 91 mg/dL (ref 70–99)
Potassium: 4.2 mmol/L (ref 3.5–5.1)
Sodium: 139 mmol/L (ref 135–145)

## 2022-06-16 LAB — CBC
HCT: 41.6 % (ref 36.0–46.0)
Hemoglobin: 13.6 g/dL (ref 12.0–15.0)
MCH: 30.2 pg (ref 26.0–34.0)
MCHC: 32.7 g/dL (ref 30.0–36.0)
MCV: 92.2 fL (ref 80.0–100.0)
Platelets: 268 10*3/uL (ref 150–400)
RBC: 4.51 MIL/uL (ref 3.87–5.11)
RDW: 13.7 % (ref 11.5–15.5)
WBC: 5.2 10*3/uL (ref 4.0–10.5)
nRBC: 0 % (ref 0.0–0.2)

## 2022-06-16 NOTE — Progress Notes (Signed)
For Short Stay: COVID SWAB appointment date:  Bowel Prep reminder: N/A   For Anesthesia: PCP - NO PCP Cardiologist - N/A  Chest x-ray -  EKG -  Stress Test -  ECHO -  Cardiac Cath -  Pacemaker/ICD device last checked: Pacemaker orders received: Device Rep notified:  Spinal Cord Stimulator:  Sleep Study - N/A CPAP -   Fasting Blood Sugar - N/A Checks Blood Sugar ___0__ times a day Date and result of last Hgb A1c-  Last dose of GLP1 agonist- N/A GLP1 instructions:   Last dose of SGLT-2 inhibitors-  SGLT-2 instructions:   Blood Thinner Instructions: Aspirin Instructions: Last Dose:  Activity level: Can go up a flight of stairs and activities of daily living without stopping and without chest pain and/or shortness of breath   Able to exercise without chest pain and/or shortness of breat   Anesthesia review:   Patient denies shortness of breath, fever, cough and chest pain at PAT appointment   Patient verbalized understanding of instructions that were given to them at the PAT appointment. Patient was also instructed that they will need to review over the PAT instructions again at home before surgery.

## 2022-06-22 ENCOUNTER — Ambulatory Visit (HOSPITAL_BASED_OUTPATIENT_CLINIC_OR_DEPARTMENT_OTHER): Payer: Medicaid Other | Admitting: Anesthesiology

## 2022-06-22 ENCOUNTER — Other Ambulatory Visit: Payer: Self-pay

## 2022-06-22 ENCOUNTER — Ambulatory Visit (HOSPITAL_COMMUNITY)
Admission: RE | Admit: 2022-06-22 | Discharge: 2022-06-22 | Disposition: A | Discharge status: Home / Self Care | Payer: Medicaid Other | Attending: Surgery | Admitting: Surgery

## 2022-06-22 ENCOUNTER — Ambulatory Visit (HOSPITAL_COMMUNITY): Payer: Medicaid Other | Admitting: Anesthesiology

## 2022-06-22 ENCOUNTER — Encounter (HOSPITAL_COMMUNITY): Payer: Self-pay | Admitting: Surgery

## 2022-06-22 ENCOUNTER — Encounter (HOSPITAL_COMMUNITY)
Admission: RE | Disposition: A | Discharge status: Home / Self Care | Payer: Self-pay | Source: Home / Self Care | Attending: Surgery

## 2022-06-22 DIAGNOSIS — Z09 Encounter for follow-up examination after completed treatment for conditions other than malignant neoplasm: Secondary | ICD-10-CM | POA: Diagnosis not present

## 2022-06-22 DIAGNOSIS — K439 Ventral hernia without obstruction or gangrene: Secondary | ICD-10-CM | POA: Insufficient documentation

## 2022-06-22 DIAGNOSIS — F17200 Nicotine dependence, unspecified, uncomplicated: Secondary | ICD-10-CM | POA: Diagnosis not present

## 2022-06-22 DIAGNOSIS — K420 Umbilical hernia with obstruction, without gangrene: Secondary | ICD-10-CM | POA: Insufficient documentation

## 2022-06-22 DIAGNOSIS — Z6836 Body mass index (BMI) 36.0-36.9, adult: Secondary | ICD-10-CM | POA: Diagnosis not present

## 2022-06-22 DIAGNOSIS — M6208 Separation of muscle (nontraumatic), other site: Secondary | ICD-10-CM | POA: Insufficient documentation

## 2022-06-22 DIAGNOSIS — D649 Anemia, unspecified: Secondary | ICD-10-CM

## 2022-06-22 DIAGNOSIS — Z8674 Personal history of sudden cardiac arrest: Secondary | ICD-10-CM | POA: Diagnosis not present

## 2022-06-22 DIAGNOSIS — Z01818 Encounter for other preprocedural examination: Secondary | ICD-10-CM

## 2022-06-22 HISTORY — PX: VENTRAL HERNIA REPAIR: SHX424

## 2022-06-22 LAB — POCT PREGNANCY, URINE: Preg Test, Ur: NEGATIVE

## 2022-06-22 SURGERY — REPAIR, HERNIA, VENTRAL, LAPAROSCOPIC
Anesthesia: General

## 2022-06-22 MED ORDER — BUPIVACAINE LIPOSOME 1.3 % IJ SUSP: 20.0000 mL | Freq: Once | INTRAMUSCULAR | Status: DC

## 2022-06-22 MED ORDER — SODIUM CHLORIDE 0.9 % IV SOLN: 250.0000 mL | INTRAVENOUS | Status: DC | PRN

## 2022-06-22 MED ORDER — PROPOFOL 10 MG/ML IV BOLUS
INTRAVENOUS | Status: AC
  Filled 2022-06-22: qty 20

## 2022-06-22 MED ORDER — FENTANYL CITRATE (PF) 250 MCG/5ML IJ SOLN
INTRAMUSCULAR | Status: AC
  Filled 2022-06-22: qty 5

## 2022-06-22 MED ORDER — HYDROMORPHONE HCL 1 MG/ML IJ SOLN
INTRAMUSCULAR | Status: AC
  Administered 2022-06-22: 0.5 mg via INTRAVENOUS
  Filled 2022-06-22: qty 1

## 2022-06-22 MED ORDER — 0.9 % SODIUM CHLORIDE (POUR BTL) OPTIME
TOPICAL | Status: DC | PRN
  Administered 2022-06-22: 1000 mL

## 2022-06-22 MED ORDER — GABAPENTIN 300 MG PO CAPS
300.0000 mg | ORAL_CAPSULE | ORAL | Status: AC
  Administered 2022-06-22: 300 mg via ORAL
  Filled 2022-06-22: qty 1

## 2022-06-22 MED ORDER — MIDAZOLAM HCL 2 MG/2ML IJ SOLN
INTRAMUSCULAR | Status: AC
  Filled 2022-06-22: qty 2

## 2022-06-22 MED ORDER — SODIUM CHLORIDE 0.9% FLUSH: 3.0000 mL | INTRAVENOUS | Status: DC | PRN

## 2022-06-22 MED ORDER — ACETAMINOPHEN 325 MG PO TABS: 650.0000 mg | ORAL_TABLET | ORAL | Status: DC | PRN

## 2022-06-22 MED ORDER — ACETAMINOPHEN 500 MG PO TABS
1000.0000 mg | ORAL_TABLET | ORAL | Status: AC
  Administered 2022-06-22: 1000 mg via ORAL
  Filled 2022-06-22: qty 2

## 2022-06-22 MED ORDER — DEXAMETHASONE SODIUM PHOSPHATE 10 MG/ML IJ SOLN
INTRAMUSCULAR | Status: DC | PRN
  Administered 2022-06-22: 10 mg via INTRAVENOUS

## 2022-06-22 MED ORDER — VANCOMYCIN HCL IN DEXTROSE 1-5 GM/200ML-% IV SOLN
1000.0000 mg | INTRAVENOUS | Status: AC
  Administered 2022-06-22: 1000 mg via INTRAVENOUS
  Filled 2022-06-22: qty 200

## 2022-06-22 MED ORDER — VANCOMYCIN HCL 1.5 G IV SOLR
1500.0000 mg | INTRAVENOUS | Status: DC
  Filled 2022-06-22: qty 30

## 2022-06-22 MED ORDER — CHLORHEXIDINE GLUCONATE 4 % EX SOLN: 60.0000 mL | Freq: Once | CUTANEOUS | Status: DC

## 2022-06-22 MED ORDER — SUGAMMADEX SODIUM 200 MG/2ML IV SOLN
INTRAVENOUS | Status: DC | PRN
  Administered 2022-06-22: 200 mg via INTRAVENOUS

## 2022-06-22 MED ORDER — DOCUSATE SODIUM 100 MG PO CAPS: 100.0000 mg | ORAL_CAPSULE | Freq: Two times a day (BID) | ORAL | 0 refills | Status: AC

## 2022-06-22 MED ORDER — FENTANYL CITRATE (PF) 100 MCG/2ML IJ SOLN
INTRAMUSCULAR | Status: DC | PRN
  Administered 2022-06-22 (×2): 100 ug via INTRAVENOUS
  Administered 2022-06-22: 50 ug via INTRAVENOUS

## 2022-06-22 MED ORDER — HYDROMORPHONE HCL 1 MG/ML IJ SOLN
INTRAMUSCULAR | Status: AC
  Filled 2022-06-22: qty 1

## 2022-06-22 MED ORDER — OXYCODONE HCL 5 MG PO TABS: 5.0000 mg | ORAL_TABLET | ORAL | Status: DC | PRN

## 2022-06-22 MED ORDER — HYDROMORPHONE HCL 1 MG/ML IJ SOLN
0.2500 mg | INTRAMUSCULAR | Status: DC | PRN
  Administered 2022-06-22: 0.5 mg via INTRAVENOUS
  Administered 2022-06-22: 0.25 mg via INTRAVENOUS

## 2022-06-22 MED ORDER — CHLORHEXIDINE GLUCONATE 0.12 % MT SOLN
15.0000 mL | Freq: Once | OROMUCOSAL | Status: AC
  Administered 2022-06-22: 15 mL via OROMUCOSAL

## 2022-06-22 MED ORDER — FENTANYL CITRATE PF 50 MCG/ML IJ SOSY: 25.0000 ug | PREFILLED_SYRINGE | INTRAMUSCULAR | Status: DC | PRN

## 2022-06-22 MED ORDER — ORAL CARE MOUTH RINSE: 15.0000 mL | Freq: Once | OROMUCOSAL | Status: AC

## 2022-06-22 MED ORDER — MIDAZOLAM HCL 5 MG/5ML IJ SOLN
INTRAMUSCULAR | Status: DC | PRN
  Administered 2022-06-22: 2 mg via INTRAVENOUS

## 2022-06-22 MED ORDER — LACTATED RINGERS IV SOLN: INTRAVENOUS | Status: DC

## 2022-06-22 MED ORDER — ACETAMINOPHEN 650 MG RE SUPP: 650.0000 mg | RECTAL | Status: DC | PRN

## 2022-06-22 MED ORDER — ONDANSETRON HCL 4 MG/2ML IJ SOLN
INTRAMUSCULAR | Status: DC | PRN
  Administered 2022-06-22: 4 mg via INTRAVENOUS

## 2022-06-22 MED ORDER — ROCURONIUM BROMIDE 10 MG/ML (PF) SYRINGE
PREFILLED_SYRINGE | INTRAVENOUS | Status: DC | PRN
  Administered 2022-06-22: 70 mg via INTRAVENOUS

## 2022-06-22 MED ORDER — BUPIVACAINE HCL (PF) 0.25 % IJ SOLN
INTRAMUSCULAR | Status: DC | PRN
  Administered 2022-06-22: 30 mL

## 2022-06-22 MED ORDER — PROPOFOL 10 MG/ML IV BOLUS
INTRAVENOUS | Status: DC | PRN
  Administered 2022-06-22: 180 mg via INTRAVENOUS

## 2022-06-22 MED ORDER — SODIUM CHLORIDE 0.9% FLUSH: 3.0000 mL | Freq: Two times a day (BID) | INTRAVENOUS | Status: DC

## 2022-06-22 MED ORDER — ONDANSETRON HCL 4 MG/2ML IJ SOLN
INTRAMUSCULAR | Status: AC
  Filled 2022-06-22: qty 2

## 2022-06-22 MED ORDER — OXYCODONE HCL 5 MG PO TABS: 5.0000 mg | ORAL_TABLET | Freq: Three times a day (TID) | ORAL | 0 refills | Status: AC | PRN

## 2022-06-22 MED ORDER — BUPIVACAINE HCL (PF) 0.25 % IJ SOLN
INTRAMUSCULAR | Status: AC
  Filled 2022-06-22: qty 30

## 2022-06-22 MED ORDER — LIDOCAINE 2% (20 MG/ML) 5 ML SYRINGE
INTRAMUSCULAR | Status: DC | PRN
  Administered 2022-06-22: 80 mg via INTRAVENOUS

## 2022-06-22 SURGICAL SUPPLY — 42 items
ADH SKN CLS APL DERMABOND .7 (GAUZE/BANDAGES/DRESSINGS) ×1
APL PRP STRL LF DISP 70% ISPRP (MISCELLANEOUS) ×1
BAG COUNTER SPONGE SURGICOUNT (BAG) IMPLANT
BAG SPNG CNTER NS LX DISP (BAG)
BINDER ABDOMINAL 12 ML 46-62 (SOFTGOODS) IMPLANT
CABLE HIGH FREQUENCY MONO STRZ (ELECTRODE) ×1 IMPLANT
CHLORAPREP W/TINT 26 (MISCELLANEOUS) ×1 IMPLANT
COVER SURGICAL LIGHT HANDLE (MISCELLANEOUS) ×1 IMPLANT
DERMABOND ADVANCED .7 DNX12 (GAUZE/BANDAGES/DRESSINGS) ×1 IMPLANT
DEVICE SECURE STRAP 25 ABSORB (INSTRUMENTS) IMPLANT
ELECT PENCIL ROCKER SW 15FT (MISCELLANEOUS) IMPLANT
ELECT REM PT RETURN 15FT ADLT (MISCELLANEOUS) ×1 IMPLANT
GLOVE BIO SURGEON STRL SZ 6 (GLOVE) ×1 IMPLANT
GLOVE INDICATOR 6.5 STRL GRN (GLOVE) ×1 IMPLANT
GLOVE SS BIOGEL STRL SZ 6 (GLOVE) ×1 IMPLANT
GOWN STRL REUS W/ TWL LRG LVL3 (GOWN DISPOSABLE) ×1 IMPLANT
GOWN STRL REUS W/ TWL XL LVL3 (GOWN DISPOSABLE) IMPLANT
GOWN STRL REUS W/TWL LRG LVL3 (GOWN DISPOSABLE) ×1
GOWN STRL REUS W/TWL XL LVL3 (GOWN DISPOSABLE)
GRASPER SUT TROCAR 14GX15 (MISCELLANEOUS) ×1 IMPLANT
IRRIG SUCT STRYKERFLOW 2 WTIP (MISCELLANEOUS)
IRRIGATION SUCT STRKRFLW 2 WTP (MISCELLANEOUS) IMPLANT
KIT BASIN OR (CUSTOM PROCEDURE TRAY) ×1 IMPLANT
KIT TURNOVER KIT A (KITS) IMPLANT
MARKER SKIN DUAL TIP RULER LAB (MISCELLANEOUS) ×1 IMPLANT
MESH VENTRALIGHT ST 4X6IN (Mesh General) IMPLANT
PENCIL SMOKE EVACUATOR (MISCELLANEOUS) IMPLANT
SCISSORS LAP 5X35 DISP (ENDOMECHANICALS) ×1 IMPLANT
SET TUBE SMOKE EVAC HIGH FLOW (TUBING) ×1 IMPLANT
SHEARS HARMONIC ACE PLUS 36CM (ENDOMECHANICALS) IMPLANT
SLEEVE Z-THREAD 5X100MM (TROCAR) ×1 IMPLANT
SPIKE FLUID TRANSFER (MISCELLANEOUS) IMPLANT
STRIP CLOSURE SKIN 1/2X4 (GAUZE/BANDAGES/DRESSINGS) ×1 IMPLANT
SUT ETHIBOND 0 (SUTURE) ×2 IMPLANT
SUT MNCRL AB 4-0 PS2 18 (SUTURE) ×1 IMPLANT
SUT NOVA NAB GS-21 1 T12 (SUTURE) IMPLANT
SUT VIC AB 3-0 SH 27 (SUTURE) ×1
SUT VIC AB 3-0 SH 27X BRD (SUTURE) IMPLANT
TOWEL OR 17X26 10 PK STRL BLUE (TOWEL DISPOSABLE) ×1 IMPLANT
TRAY LAPAROSCOPIC (CUSTOM PROCEDURE TRAY) ×1 IMPLANT
TROCAR ADV FIXATION 12X100MM (TROCAR) ×1 IMPLANT
TROCAR Z-THREAD OPTICAL 5X100M (TROCAR) ×1 IMPLANT

## 2022-06-22 NOTE — Interval H&P Note (Signed)
History and Physical Interval Note:  06/22/2022 10:30 AM  Cheryl Fischer  has presented today for surgery, with the diagnosis of VENTRAL HERNIA.  The various methods of treatment have been discussed with the patient and family. After consideration of risks, benefits and other options for treatment, the patient has consented to  Procedure(s): LAPAROSCOPIC VENTRAL HERNIA REPAIR (N/A) as a surgical intervention.  The patient's history has been reviewed, patient examined, no change in status, stable for surgery.  I have reviewed the patient's chart and labs.  Questions were answered to the patient's satisfaction.     Tamea Bai Lollie Sails

## 2022-06-22 NOTE — Discharge Instructions (Addendum)
HERNIA REPAIR: POST OP INSTRUCTIONS   EAT Gradually transition to a high fiber diet with a fiber supplement over the next few weeks after discharge.  Start with a pureed / full liquid diet (see below)  WALK Walk an hour a day (cumulative- not all at once).  Control your pain to do that.    CONTROL PAIN Control pain so that you can walk, sleep, tolerate sneezing/coughing, and go up/down stairs.  HAVE A BOWEL MOVEMENT DAILY Keep your bowels regular to avoid problems.  OK to try a laxative to override constipation.  OK to use an antidiarrheal to slow down diarrhea.  Call if not better after 2 tries  CALL IF YOU HAVE PROBLEMS/CONCERNS Call if you are still struggling despite following these instructions. Call if you have concerns not answered by these instructions  ######################################################################    DIET: Follow a light bland diet & liquids the first 24 hours after arrival home, such as soup, liquids, starches, etc.  Be sure to drink plenty of fluids.  Quickly advance to a usual solid diet within a few days.  Avoid fast food or heavy meals initially as you are more likely to get nauseated or have irregular bowels.  A low-sugar, high-fiber diet for the rest of your life is ideal.   Take your usually prescribed home medications unless otherwise directed.  PAIN CONTROL: Pain is best controlled by a usual combination of three different methods TOGETHER: Ice/Heat Over the counter pain medication Prescription pain medication Most patients will experience some swelling and bruising around the hernia(s) such as the bellybutton, groins, or old incisions.  Ice packs or heating pads (30-60 minutes up to 6 times a day) will help. Use ice for the first few days to help decrease swelling and bruising, then switch to heat to help relax tight/sore spots and speed recovery.  Some people prefer to use ice alone, heat alone, alternating between ice & heat.  Experiment  to what works for you.  Swelling and bruising can take several weeks to resolve.   It is helpful to take an over-the-counter pain medication regularly for the first days: Naproxen (Aleve, etc)  Two 220mg tabs twice a day OR Ibuprofen (Advil, etc) Three 200mg tabs four times a day (every meal & bedtime) AND Acetaminophen (Tylenol, etc) 325-650mg four times a day (every meal & bedtime) A  prescription for pain medication should be given to you upon discharge.  Take your pain medication as prescribed, IF NEEDED.  If you are having problems/concerns with the prescription medicine (does not control pain, nausea, vomiting, rash, itching, etc), please call us [phone] to see if we need to switch you to a different pain medicine that will work better for you and/or control your side effect better. If you need a refill on your pain medication, please contact your pharmacy.  They will contact our office to request authorization. Prescriptions will not be filled after 5 pm or on week-ends.  Avoid getting constipated.  Between the surgery and the pain medications, it is common to experience some constipation.  Increasing fluid intake and taking a fiber supplement (such as Metamucil, Citrucel, FiberCon, MiraLax, etc) 1-2 times a day regularly will usually help prevent this problem from occurring.  A mild laxative (prune juice, Milk of Magnesia, MiraLax, etc) should be taken according to package directions if there are no bowel movements after 48 hours.    Wash / shower every day, starting 2 days after surgery.  You may shower over   the steri strips or skin glue which are waterproof.  No rubbing, scrubbing, lotions or ointments to incision(s). Do not soak or submerge.   Remove your outer bandage/bandaids 2 days after surgery. Steri strips (white tapes on each site) will peel off after 1-2 weeks.  You may leave the incision open to air.  You may replace a dressing/Band-Aid to cover an incision for comfort if you  wish.  Continue to shower over incision(s) after the dressing is off. Wear your abdominal binder for at least the first 3-4 days after surgery. After this, wear it whenever you are up and moving around but ok to remove when you are resting. Continue until you follow up with your surgeon. This will help support your abdominal wall and decrease pain.  ACTIVITIES as tolerated:   You may resume regular (light) daily activities beginning the next day--such as daily self-care, walking, climbing stairs--gradually increasing activities as tolerated.  Control your pain so that you can walk an hour a day.  If you can walk 30 minutes without difficulty, it is safe to try more intense activity such as jogging, treadmill, bicycling, low-impact aerobics, swimming, etc. Refrain from the most intensive and strenuous activity such as sit-ups, heavy lifting, contact sports, etc  Refrain from any heavy lifting or straining until 6 weeks after surgery.   DO NOT PUSH THROUGH PAIN.  Let pain be your guide: If it hurts to do something, don't do it.  Pain is your body warning you to avoid that activity for another week until the pain goes down. You may drive when you are no longer taking prescription pain medication, you can comfortably wear a seatbelt, and you can safely maneuver your car and apply brakes. You may have sexual intercourse when it is comfortable.   FOLLOW UP in our office Please call [facility] at [phone] to set up an appointment to see your surgeon in the office for a follow-up appointment approximately 2-3 weeks after your surgery. Make sure that you call for this appointment the day you arrive home to insure a convenient appointment time.  9.  If you have disability of FMLA / Family leave forms, please bring the forms to the office for processing.  (do not give to your surgeon).  WHEN TO CALL us [phone]: Poor pain control Reactions / problems with new medications (rash/itching, nausea, etc)   Fever over 101.5 F (38.5 C) Inability to urinate Nausea and/or vomiting Worsening swelling or bruising Continued bleeding from incision. Increased pain, redness, or drainage from the incision   The clinic staff is available to answer your questions during regular business hours (8:30am-5pm).  Please don't hesitate to call and ask to speak to one of our nurses for clinical concerns.   If you have a medical emergency, go to the nearest emergency room or call 911.  A surgeon from [facility] is always on call at the hospitals in [facility], [place] [address], [address], [place], [place]  [address] ?  [address] [address], [place], [place]   [address] MAIN: [phone] ? TOLL FREE: [phone] ? FAX: [phone] [email or web address]

## 2022-06-22 NOTE — Anesthesia Preprocedure Evaluation (Addendum)
Anesthesia Evaluation  Patient identified by MRN, date of birth, ID band Patient awake    Reviewed: Allergy & Precautions, H&P , NPO status , Patient's Chart, lab work & pertinent test results  Airway Mallampati: I  TM Distance: >3 FB Neck ROM: Full    Dental no notable dental hx. (+) Teeth Intact   Pulmonary former smoker   Pulmonary exam normal breath sounds clear to auscultation       Cardiovascular negative cardio ROS  Rhythm:Regular Rate:Normal     Neuro/Psych negative neurological ROS  negative psych ROS   GI/Hepatic negative GI ROS, Neg liver ROS,,,  Endo/Other    Morbid obesity  Renal/GU negative Renal ROS  negative genitourinary   Musculoskeletal   Abdominal   Peds  Hematology  (+) Blood dyscrasia, anemia   Anesthesia Other Findings   Reproductive/Obstetrics negative OB ROS                             Anesthesia Physical Anesthesia Plan  ASA: 2  Anesthesia Plan: General   Post-op Pain Management: Tylenol PO (pre-op)* and Toradol IV (intra-op)*   Induction: Intravenous  PONV Risk Score and Plan: 4 or greater and Ondansetron, Dexamethasone and Midazolam  Airway Management Planned: Oral ETT  Additional Equipment:   Intra-op Plan:   Post-operative Plan: Extubation in OR  Informed Consent: I have reviewed the patients History and Physical, chart, labs and discussed the procedure including the risks, benefits and alternatives for the proposed anesthesia with the patient or authorized representative who has indicated his/her understanding and acceptance.     Dental advisory given  Plan Discussed with: CRNA and Surgeon  Anesthesia Plan Comments:        Anesthesia Quick Evaluation

## 2022-06-22 NOTE — Op Note (Signed)
Operative Note  Cheryl Fischer  [id number]  [id number]  06/22/2022   Surgeon: Phylliss Blakes MD FACS   Procedure performed: Laparoscopic repair of incarcerated umbilical hernia with intraperitoneal onlay mesh; fascial defect 3 cm   Preop diagnosis: Chronically incarcerated umbilical hernia, fascial defect not palpable Post-op diagnosis/intraop findings: Chronically incarcerated umbilical hernia containing omentum and preperitoneal fat; fascial defect measures 3 cm on laparoscopic view and after dissection of the hernia sac.   Specimens: no Retained items: no  EBL: Minimal cc Complications: none   Description of procedure: After obtaining informed consent the patient was taken to the operating room and placed supine on operating room table where general endotracheal anesthesia was initiated, preoperative antibiotics were administered, SCDs applied, and a formal timeout was performed.  The abdomen was prepped and draped in usual sterile fashion.  Peritoneal access was gained using optical entry in the left upper quadrant followed by insufflation to 15 mmHg.  The abdominal cavity was inspected and confirmed to be free of injury.  An umbilical hernia was noted containing chronically incarcerated omentum.  Bilateral laparoscopic assisted tap blocks were performed with 0.25% Marcaine and 2 additional left sided 5 mm trocars were placed.  The harmonic scalpel was used to divide adhesions between the omentum and the anterior abdominal wall.  The omentum within the hernia sac was quite stuck and densely adherent, therefore the omentum going into the hernia defect was amputated with the harmonic scalpel.  The falciform ligament was also taken down off the abdominal wall several centimeters cephalad to the hernia defect to create a landing zone for the mesh.  Hemostasis was ensured.  We then made a small vertical incision over the umbilical hernia and dissected out the hernia sac, skeletonizing this down to the  level of the fascia whereupon it was excised along with the chronically incarcerated but amputated omentum and preperitoneal fat.  The hernia defect at this point measured 3 cm.  A 4 inch x 6 inch Ventralight mesh was selected.  Stay sutures of 0 Ethibond were placed in the 4 cardinal directions and the mesh was marked for orientation.  The mesh was inserted to the abdominal cavity and then the fascial defect was closed vertically with interrupted figure-of-eight #1 Novofils.  Hemostasis was ensured within the wound.  The abdomen was then reinsufflated and the fascial closure inspected and confirmed to be intact and airtight without any entrapped structures.  The mesh was unfurled and oriented with the rough surface facing the abdominal wall and the coated surface facing the viscera. The laparoscopic suture passer was used to bring the state sutures through the abdominal wall for transfascial fixation at the 4 cardinal directions.  The secure strap PDS tacker was used to further fix the mesh to the anterior abdominal wall.  A circumferential outer crown of tacks as well as an inner crown of tacks was employed.  On completion the mesh is flush against the abdominal wall without any exposed rough surface.  Abdominal cavity was inspected once more and confirmed to be hemostatic: All the bowel appeared healthy without any abnormality or injury.  Additional local was infiltrated around each of the transfascial suture fixation points in the port sites.  The abdomen was then desufflated and trocars removed.  The deep soft tissues at the hernia site were reapproximated with interrupted 3-0 Vicryl's and the incisions were then all closed with subcuticular 4-0 Monocryl.  Benzoin, Steri-Strips and Band-Aid/sterile dressings were applied followed by an abdominal binder.  The  patient was then awakened, extubated and taken to PACU in stable condition.    All counts were correct at the completion of the case.

## 2022-06-22 NOTE — Anesthesia Postprocedure Evaluation (Signed)
Anesthesia Post Note  Patient: Cheryl Fischer  Procedure(s) Performed: LAPAROSCOPIC VENTRAL HERNIA REPAIR     Patient location during evaluation: PACU Anesthesia Type: General Level of consciousness: awake and alert Pain management: pain level controlled Vital Signs Assessment: post-procedure vital signs reviewed and stable Respiratory status: spontaneous breathing, nonlabored ventilation and respiratory function stable Cardiovascular status: blood pressure returned to baseline and stable Postop Assessment: no apparent nausea or vomiting Anesthetic complications: no  No notable events documented.  Last Vitals:  Vitals:   06/22/22 1345 06/22/22 1400  BP: 120/84 130/86  Pulse: (!) 53 (!) 55  Resp: 13 13  Temp:    SpO2: 92% 93%    Last Pain:  Vitals:   06/22/22 1400  TempSrc:   PainSc: Asleep                 Hopelynn Gartland,W. EDMOND

## 2022-06-22 NOTE — Transfer of Care (Signed)
Immediate Anesthesia Transfer of Care Note  Patient: Cheryl Fischer  Procedure(s) Performed: LAPAROSCOPIC VENTRAL HERNIA REPAIR  Patient Location: PACU  Anesthesia Type:General  Level of Consciousness: sedated, patient cooperative, and responds to stimulation  Airway & Oxygen Therapy: Patient Spontanous Breathing and Patient connected to face mask oxygen  Post-op Assessment: Report given to RN and Post -op Vital signs reviewed and stable  Post vital signs: Reviewed and stable  Last Vitals:  Vitals Value Taken Time  BP 142/78 06/22/22 1305  Temp    Pulse 77 06/22/22 1307  Resp 20 06/22/22 1307  SpO2 100 % 06/22/22 1307  Vitals shown include unvalidated device data.  Last Pain:  Vitals:   06/22/22 0924  TempSrc: Oral         Complications: No notable events documented.

## 2022-06-22 NOTE — Anesthesia Procedure Notes (Signed)
Procedure Name: Intubation Date/Time: 06/22/2022 11:27 AM  Performed by: Kizzie Fantasia, CRNAPre-anesthesia Checklist: Patient identified, Emergency Drugs available, Suction available, Patient being monitored and Timeout performed Patient Re-evaluated:Patient Re-evaluated prior to induction Oxygen Delivery Method: Circle system utilized Preoxygenation: Pre-oxygenation with 100% oxygen Induction Type: IV induction Ventilation: Mask ventilation without difficulty Laryngoscope Size: Mac and 4 Grade View: Grade I Tube type: Oral Tube size: 7.0 mm Number of attempts: 1 Airway Equipment and Method: Stylet Placement Confirmation: ETT inserted through vocal cords under direct vision, positive ETCO2 and breath sounds checked- equal and bilateral Secured at: 22 cm Tube secured with: Tape Dental Injury: Teeth and Oropharynx as per pre-operative assessment

## 2022-06-23 ENCOUNTER — Encounter (HOSPITAL_COMMUNITY): Payer: Self-pay | Admitting: Surgery

## 2022-07-20 ENCOUNTER — Ambulatory Visit: Payer: Medicaid Other | Attending: Surgery

## 2022-07-20 ENCOUNTER — Other Ambulatory Visit: Payer: Self-pay

## 2022-07-20 DIAGNOSIS — M6281 Muscle weakness (generalized): Secondary | ICD-10-CM | POA: Diagnosis present

## 2022-07-20 DIAGNOSIS — R279 Unspecified lack of coordination: Secondary | ICD-10-CM | POA: Insufficient documentation

## 2022-07-20 NOTE — Therapy (Addendum)
OUTPATIENT PHYSICAL THERAPY FEMALE PELVIC EVALUATION   Patient Name: Cheryl Fischer MRN: [id number] DOB:09-27-1990, 32 y.o., female Today's Date: 07/20/2022  END OF SESSION:  PT End of Session - 07/20/22 1528     Visit Number 1    Date for PT Re-Evaluation November 08, 2022    Authorization Type Medicaid [employer]    PT Start Time 1445    PT Stop Time 1520    PT Time Calculation (min) 35 min    Activity Tolerance Patient tolerated treatment well    Behavior During Therapy [facility] for tasks assessed/performed             Past Medical History:  Diagnosis Date   Anemia    Blood transfusion without reported diagnosis    second set of twins   Hernia of abdominal wall    Neonatal death 2018/11/08   2018/10/16 at 38wks>hemorrhagic shock, PEA arrest, neonatal demise   Pre-diabetes    Respiratory failure (HCC)    Vaginal Pap smear, abnormal    Past Surgical History:  Procedure Laterality Date   CESAREAN SECTION  05/01/2010   CESAREAN SECTION N/A Nov 08, 2018   Procedure: CESAREAN SECTION;  Surgeon: Reva Bores, MD;  Location: [facility];  Service: Obstetrics;  Laterality: N/A;   CESAREAN SECTION  06/24/2011   CESAREAN SECTION  12/27/2014   CESAREAN SECTION  03/12/2017   CESAREAN SECTION  12/29/2015   CESAREAN SECTION WITH BILATERAL TUBAL LIGATION Bilateral 05/20/2021   Procedure: CESAREAN SECTION WITH BILATERAL TUBAL LIGATION;  Surgeon: Federico Flake, MD;  Location: [facility];  Service: Obstetrics;  Laterality: Bilateral;   VENTRAL HERNIA REPAIR N/A 06/22/2022   Procedure: LAPAROSCOPIC VENTRAL HERNIA REPAIR;  Surgeon: Berna Bue, MD;  Location: [facility];  Service: General;  Laterality: N/A;   Patient Active Problem List   Diagnosis Date Noted   Postoperative anemia due to acute blood loss 05/22/2021   History of postpartum hemorrhage    Repeat classical cesarean section 05/20/2021   Status post bilateral salpingectomy 05/20/2021   Unwanted fertility 03/25/2021   Absent fetal  nasal bone 02/18/2021   BMI 40.0-44.9, adult (HCC) 02/18/2021   History of gestational diabetes in prior pregnancy, currently pregnant 12/22/2020   Supraumbilical hernia 12/22/2020   Supervision of high risk pregnancy, antepartum 11/28/2020   Hemorrhagic shock (HCC) 11-08-18   History of uterine rupture 11-08-18   History of cardiac arrest    Obesity during pregnancy 08/17/2018   history of cesarean section x 5 04/24/2018    PCP: NA  REFERRING PROVIDER: Berna Bue, MD  REFERRING DIAG: M62.08 (ICD-10-CM) - Separation of muscle (nontraumatic), other site  THERAPY DIAG:  Muscle weakness (generalized)  Unspecified lack of coordination  Rationale for Evaluation and Treatment: Rehabilitation  ONSET DATE: 06/22/22  SUBJECTIVE:  SUBJECTIVE STATEMENT: Pt s/p laparoscopic repair of umbilical hernia on 06/22/22. Pt states that hernia had been present for 6-7 years.  Fluid intake: Yes: drinks water every day    PAIN:  Are you having pain? No   PRECAUTIONS: None  WEIGHT BEARING RESTRICTIONS: No  FALLS:  Has patient fallen in last 6 months? No  LIVING ENVIRONMENT: Lives with: lives with their family Lives in: House/apartment   OCCUPATION: stay at home mom  PLOF: Independent  PATIENT GOALS: unsure  PERTINENT HISTORY:  4 c-sections, umbilical hernia repair Sexual abuse: No  BOWEL MOVEMENT: Pain with bowel movement: No Type of bowel movement:Frequency 1x/day and Strain No Fully empty rectum: Yes: - Leakage: No Pads: No Fiber supplement: No  URINATION: Pain with urination: No Fully empty bladder: Yes: - Stream:  WNL Urgency: No Frequency: every 2-3 hours during the day; 2-3x a night Leakage:  None Pads: No  INTERCOURSE: Pain with intercourse:  No pain Ability to have  vaginal penetration:  No Climax: WNL  PREGNANCY: Vaginal deliveries 0 Tearing No C-section deliveries 4 c-sections (2 sets of twins) Currently pregnant No  PROLAPSE: None   OBJECTIVE:  07/20/22: PFIQ-7: 43  COGNITION: Overall cognitive status: Within functional limits for tasks assessed     SENSATION: Light touch: Appears intact Proprioception: Appears intact  MUSCLE LENGTH: decreased bil anterior/posterior hip flexibility  FUNCTIONAL TESTS:  Squat: heel lift Single leg stance: Bil <5 seconds with pelvic rotation to attempt better stabilization ASLR: (-) bil CUT: abdominal coning/doming with increased abdominal pressure  GAIT: Comments: increased lumbar extension, reduced bil hip extension  POSTURE: rounded shoulders, forward head, increased lumbar lordosis, and anterior pelvic tilt  LUMBARAROM/PROM:  A/PROM A/PROM  Eval (% available)  Flexion 100  Extension 25  Right lateral flexion 100  Left lateral flexion 100  Right rotation 100  Left rotation 100   (Blank rows = not tested)  LOWER EXTREMITY MMT: Bil hip strength WNL   PALPATION:   General  4 finger width diastasis recti, abdominal scar tissue restriction, tenderness to Lt of umbilicus      TODAY'S TREATMENT:                                                                                                                              DATE:  07/20/22  EVAL  Neuromuscular re-education: Transversus abdominus training with multimodal cues for improved motor control and breath coordination Pressure management with core activation training Supine march 2 x 10 Supine hip adduction 2 x 10 Check all possible CPT codes: 16109- Neuro Re-education    Check all conditions that are expected to impact treatment: {Conditions expected to impact treatment:Current pregnancy or recent postpartum   If treatment provided at initial evaluation, no treatment charged due to lack of authorization.         PATIENT  EDUCATION:  Education details: See above Person educated: Patient Education method: Explanation, Demonstration, Tactile cues, Verbal cues, and Handouts Education  comprehension: verbalized understanding  /HOME EXERCISE PROGRAM: [id number]  ASSESSMENT:  CLINICAL IMPRESSION: Patient is a 32 y.o. female who was seen today for physical therapy evaluation and treatment for core weakness. Exam findings notable for abnormal posture, decreased lumbar extension, 4 finger width diastasis recti abdominus, abdominal scar tissue restriction (knot of restricted tissue Lt of umbilicus), abdominal tenderness to Lt of umbilicus, decreased bil hip extension, abnormal squat form, decreased single leg balance, and core weakness. Signs and symptoms are most consistent with core weakness and diastasis recti abdominus with poor pressure management. Initial treatment consisted of core training and gentle progressions to help with proprioception; we discussed concepts of abdominal pressure management in order to prevent future hernias and pelvic floor dysfunction. She will continue to benefit from skilled physical therapy intervention in order to improve abdominal pressure management, decrease diastasis/doming, and progress functional strengthening program.   OBJECTIVE IMPAIRMENTS: decreased activity tolerance, decreased coordination, decreased endurance, decreased strength, increased fascial restrictions, increased muscle spasms, postural dysfunction, and pain.   ACTIVITY LIMITATIONS: carrying, lifting, bending, squatting, and bed mobility  PARTICIPATION LIMITATIONS: cleaning, laundry, and community activity  PERSONAL FACTORS: 1 comorbidity: medical history  are also affecting patient's functional outcome.   REHAB POTENTIAL: Good  CLINICAL DECISION MAKING: Stable/uncomplicated  EVALUATION COMPLEXITY: Low   GOALS: Goals reviewed with patient? Yes  /SHORT TERM GOALS: Target date: 08/24/22  Pt will be independent  with HEP.   Baseline: Goal status: INITIAL  2.  Pt will have good understanding of appropriate pressure management with bed mobility and functional transfers.  Baseline:  Goal status: INITIAL   LONG TERM GOALS: Target date: 10/12/22   Pt will be independent with advanced HEP.   Baseline:  Goal status: INITIAL  2.  Pt will increase lumbar extension to 50% Baseline:  Goal status: INITIAL  3.  Pt will decrease diastasis recti to no greater than 3 finger width separation just superior to umbilicus and demonstrate no coning upon CUT.  Baseline:  Goal status: INITIAL  4.  Pt will be able to perform single leg stance bil for greater than 10 seconds without LOB or compensation in order to demonstrate improved functional core strength.  Baseline:  Goal status: INITIAL   PLAN:  PT FREQUENCY: 1-2x/week  PT DURATION: 6 months  PLANNED INTERVENTIONS: Therapeutic exercises, Therapeutic activity, Neuromuscular re-education, Balance training, Gait training, Patient/Family education, Self Care, Joint mobilization, Dry Needling, Biofeedback, and Manual therapy  PLAN FOR NEXT SESSION: progress core strengthening exercises; continue education on pressure management   Julio Alm, PT, DPT06/04/244:05 PM  PHYSICAL THERAPY DISCHARGE SUMMARY  Visits from Start of Care: 1  Current functional level related to goals / functional outcomes: Not met   Remaining deficits: See above   Education / Equipment: HEP   Patient agrees to discharge. Patient goals were not met. Patient is being discharged due to not returning since the last visit.  Julio Alm, PT, DPT07/31/2410:34 AM

## 2022-09-02 ENCOUNTER — Ambulatory Visit: Payer: Medicaid Other | Attending: Surgery

## 2022-09-02 DIAGNOSIS — M6281 Muscle weakness (generalized): Secondary | ICD-10-CM | POA: Insufficient documentation

## 2022-09-02 DIAGNOSIS — R279 Unspecified lack of coordination: Secondary | ICD-10-CM | POA: Insufficient documentation

## 2022-09-15 ENCOUNTER — Ambulatory Visit: Payer: Medicaid Other

## 2022-12-21 ENCOUNTER — Encounter (HOSPITAL_COMMUNITY): Payer: Self-pay

## 2022-12-21 ENCOUNTER — Ambulatory Visit (HOSPITAL_COMMUNITY)
Admission: EM | Admit: 2022-12-21 | Discharge: 2022-12-21 | Disposition: A | Discharge status: Home / Self Care | Payer: 59 | Attending: Physician Assistant | Admitting: Physician Assistant

## 2022-12-21 DIAGNOSIS — L0291 Cutaneous abscess, unspecified: Secondary | ICD-10-CM | POA: Diagnosis not present

## 2022-12-21 MED ORDER — DOXYCYCLINE HYCLATE 100 MG PO CAPS: 100.0000 mg | ORAL_CAPSULE | Freq: Two times a day (BID) | ORAL | 0 refills | Status: AC

## 2022-12-21 NOTE — ED Triage Notes (Signed)
Pt states possible spider bite to LLE for the past 3 days.  States it was draining.

## 2022-12-21 NOTE — ED Provider Notes (Signed)
[facility]    CSN: [id number] Arrival date & time: 12/21/22  1201      History   Chief Complaint Chief Complaint  Patient presents with   Abscess    HPI Cheryl Fischer is a 32 y.o. female.   Patient complains of a red swollen area left mid lower leg.  Patient is unsure if she was bitten by something.  The history is provided by the patient. No language interpreter was used.  Abscess Location:  Leg Leg abscess location:  L lower leg Abscess quality: draining, redness and warmth   Progression:  Worsening Chronicity:  New Relieved by:  Nothing Worsened by:  Nothing Ineffective treatments:  None tried Associated symptoms: no nausea     Past Medical History:  Diagnosis Date   Anemia    Blood transfusion without reported diagnosis    second set of twins   Hernia of abdominal wall    Neonatal death November 03, 2018   10/18/2018 at 38wks>hemorrhagic shock, PEA arrest, neonatal demise   Pre-diabetes    Respiratory failure (HCC)    Vaginal Pap smear, abnormal     Patient Active Problem List   Diagnosis Date Noted   Postoperative anemia due to acute blood loss 05/22/2021   History of postpartum hemorrhage    Repeat classical cesarean section 05/20/2021   Status post bilateral salpingectomy 05/20/2021   Unwanted fertility 03/25/2021   Absent fetal nasal bone 02/18/2021   BMI 40.0-44.9, adult (HCC) 02/18/2021   History of gestational diabetes in prior pregnancy, currently pregnant 12/22/2020   Supraumbilical hernia 12/22/2020   Supervision of high risk pregnancy, antepartum 11/28/2020   Hemorrhagic shock (HCC) 11/03/18   History of uterine rupture 2018-11-03   History of cardiac arrest    Obesity during pregnancy 08/17/2018   history of cesarean section x 5 04/24/2018    Past Surgical History:  Procedure Laterality Date   CESAREAN SECTION  05/01/2010   CESAREAN SECTION N/A 2018/11/03   Procedure: CESAREAN SECTION;  Surgeon: Reva Bores, MD;  Location: [facility];  Service: Obstetrics;  Laterality: N/A;   CESAREAN SECTION  06/24/2011   CESAREAN SECTION  12/27/2014   CESAREAN SECTION  03/12/2017   CESAREAN SECTION  12/29/2015   CESAREAN SECTION WITH BILATERAL TUBAL LIGATION Bilateral 05/20/2021   Procedure: CESAREAN SECTION WITH BILATERAL TUBAL LIGATION;  Surgeon: Federico Flake, MD;  Location: [facility];  Service: Obstetrics;  Laterality: Bilateral;   VENTRAL HERNIA REPAIR N/A 06/22/2022   Procedure: LAPAROSCOPIC VENTRAL HERNIA REPAIR;  Surgeon: Berna Bue, MD;  Location: [facility];  Service: General;  Laterality: N/A;    OB History     Gravida  7   Para  7   Term  5   Preterm  2   AB      Living  8      SAB      IAB      Ectopic      Multiple  2   Live Births  8        Obstetric Comments  Twins          Home Medications    Prior to Admission medications   Medication Sig Start Date End Date Taking? Authorizing Provider  doxycycline (VIBRAMYCIN) 100 MG capsule Take 1 capsule (100 mg total) by mouth 2 (two) times daily. 12/21/22  Yes Cheron Schaumann K, PA-C  ferrous gluconate (FERGON) 324 MG tablet Take 1 tablet (324 mg total) by mouth  daily with breakfast. Patient not taking: Reported on 07/20/2022 05/22/21   Anyanwu, Jethro Bastos, MD  Prenatal Vit-Fe Fumarate-FA (PRENATAL VITAMIN PO) Take 1 tablet by mouth daily. Patient not taking: Reported on 07/20/2022    [provider]  senna-docusate (SENOKOT-S) 8.6-50 MG tablet Take 2 tablets by mouth at bedtime as needed for mild constipation or moderate constipation. Patient not taking: Reported on 07/20/2022 05/22/21   Tereso Newcomer, MD  levETIRAcetam (KEPPRA) 250 MG tablet One tablet in the morning and two at night for 3 days. One tablet by mouth twice a day for 3 days and then one at bedtime for 3 days 10/15/18 11/29/19  Adam Phenix, MD  sertraline (ZOLOFT) 50 MG tablet Take 1 tablet (50 mg total) by mouth daily. 10/19/18 11/29/19  Constant, Peggy, MD   traZODone (DESYREL) 50 MG tablet Take 1 tablet (50 mg total) by mouth at bedtime. 10/20/18 11/29/19  Constant, Peggy, MD  zolpidem (AMBIEN) 5 MG tablet Take 1 tablet (5 mg total) by mouth at bedtime as needed for sleep. Patient not taking: Reported on 10/19/2018 10/15/18 11/29/19  Adam Phenix, MD    Family History Family History  Problem Relation Age of Onset   Diabetes Maternal Grandmother    Cancer Paternal Grandmother     Social History Social History   Tobacco Use   Smoking status: Former    Types: Cigarettes   Smokeless tobacco: Never  Vaping Use   Vaping status: Never Used  Substance Use Topics   Alcohol use: Never   Drug use: Never     Allergies   Penicillins   Review of Systems Review of Systems  Gastrointestinal:  Negative for nausea.  All other systems reviewed and are negative.    Physical Exam Triage Vital Signs ED Triage Vitals  Encounter Vitals Group     BP 12/21/22 1236 109/67     Systolic BP Percentile --      Diastolic BP Percentile --      Pulse Rate 12/21/22 1234 62     Resp 12/21/22 1234 16     Temp 12/21/22 1234 97.8 F (36.6 C)     Temp Source 12/21/22 1234 Oral     SpO2 12/21/22 1234 97 %     Weight --      Height --      Head Circumference --      Peak Flow --      Pain Score 12/21/22 1236 10     Pain Loc --      Pain Education --      Exclude from Growth Chart --    No data found.  Updated Vital Signs BP 109/67 (BP Location: Right Arm)   Pulse 62   Temp 97.8 F (36.6 C) (Oral)   Resp 16   LMP 12/14/2022 (Approximate)   SpO2 97%   Breastfeeding No   Visual Acuity Right Eye Distance:   Left Eye Distance:   Bilateral Distance:    Right Eye Near:   Left Eye Near:    Bilateral Near:     Physical Exam Vitals and nursing note reviewed.  Constitutional:      Appearance: She is well-developed.  HENT:     Head: Normocephalic.  Cardiovascular:     Rate and Rhythm: Normal rate.  Pulmonary:     Effort: Pulmonary  effort is normal.  Abdominal:     General: There is no distension.  Musculoskeletal:  General: Swelling and tenderness present. Normal range of motion.     Comments: 2 cm area of redness with open draining center, warm to touch  Neurological:     Mental Status: She is alert and oriented to person, place, and time.      UC Treatments / Results  Labs (all labs ordered are listed, but only abnormal results are displayed) Labs Reviewed - No data to display  EKG   Radiology No results found.  Procedures Procedures (including critical care time)  Medications Ordered in UC Medications - No data to display  Initial Impression / Assessment and Plan / UC Course  I have reviewed the triage vital signs and the nursing notes.  Pertinent labs & imaging results that were available during my care of the patient were reviewed by me and considered in my medical decision making (see chart for details).      Final Clinical Impressions(s) / UC Diagnoses   Final diagnoses:  Abscess     Discharge Instructions      Return if any problems.  Warm compresses 20 minutes 4 times a day.    ED Prescriptions     Medication Sig Dispense Auth. Provider   doxycycline (VIBRAMYCIN) 100 MG capsule Take 1 capsule (100 mg total) by mouth 2 (two) times daily. 20 capsule Elson Areas, [place]      PDMP not reviewed this encounter. An After Visit Summary was printed and given to the patient.       Elson Areas, [place] 12/21/22 2040

## 2022-12-21 NOTE — Discharge Instructions (Signed)
Return if any problems.  Warm compresses 20 minutes 4 times a day 

## 2023-02-13 IMAGING — US US OB LIMITED
1 series · 5 of 5 positions shown · non-contrast
Comparison: none

[Series 1: us ob limited · 5 of 5 slices shown]
[im 1/5]
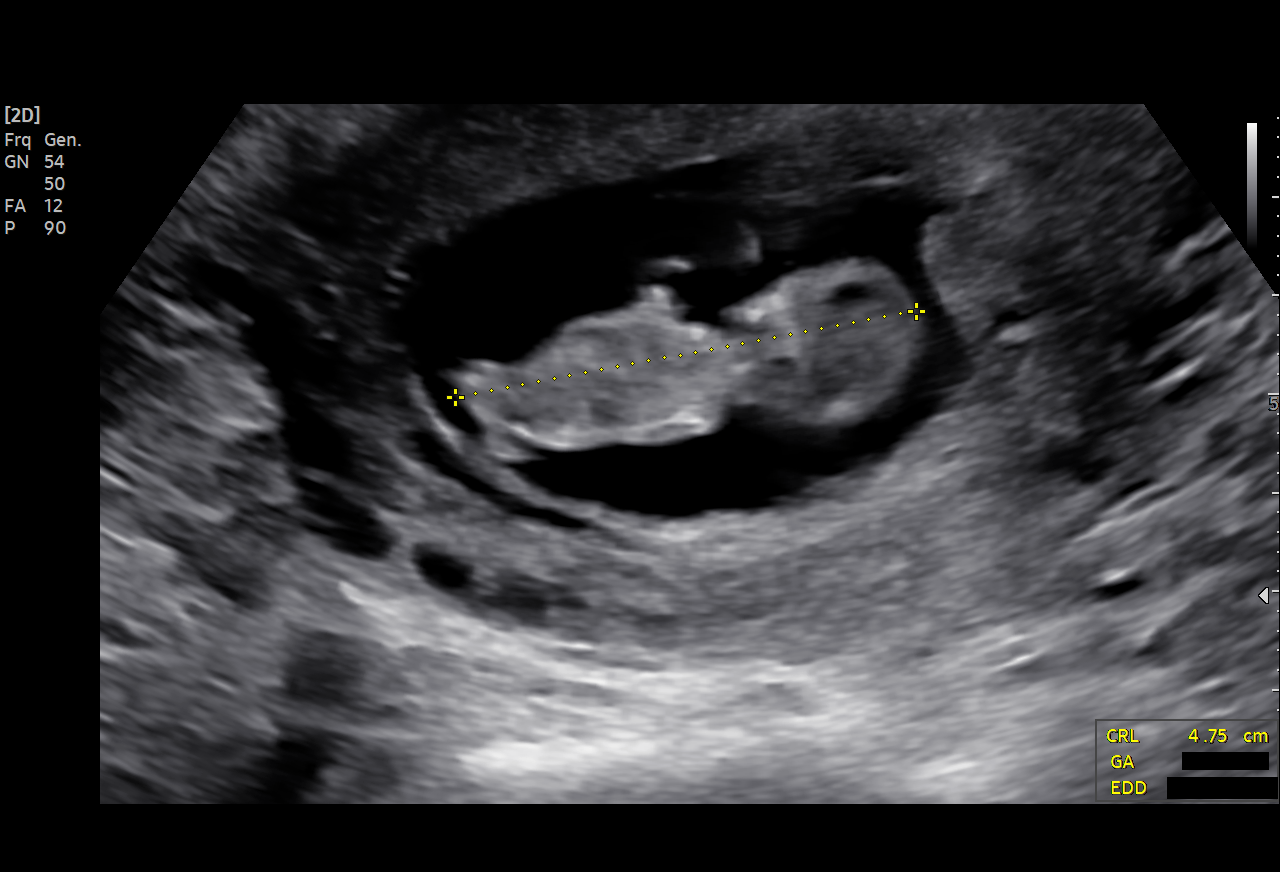
[im 2/5]
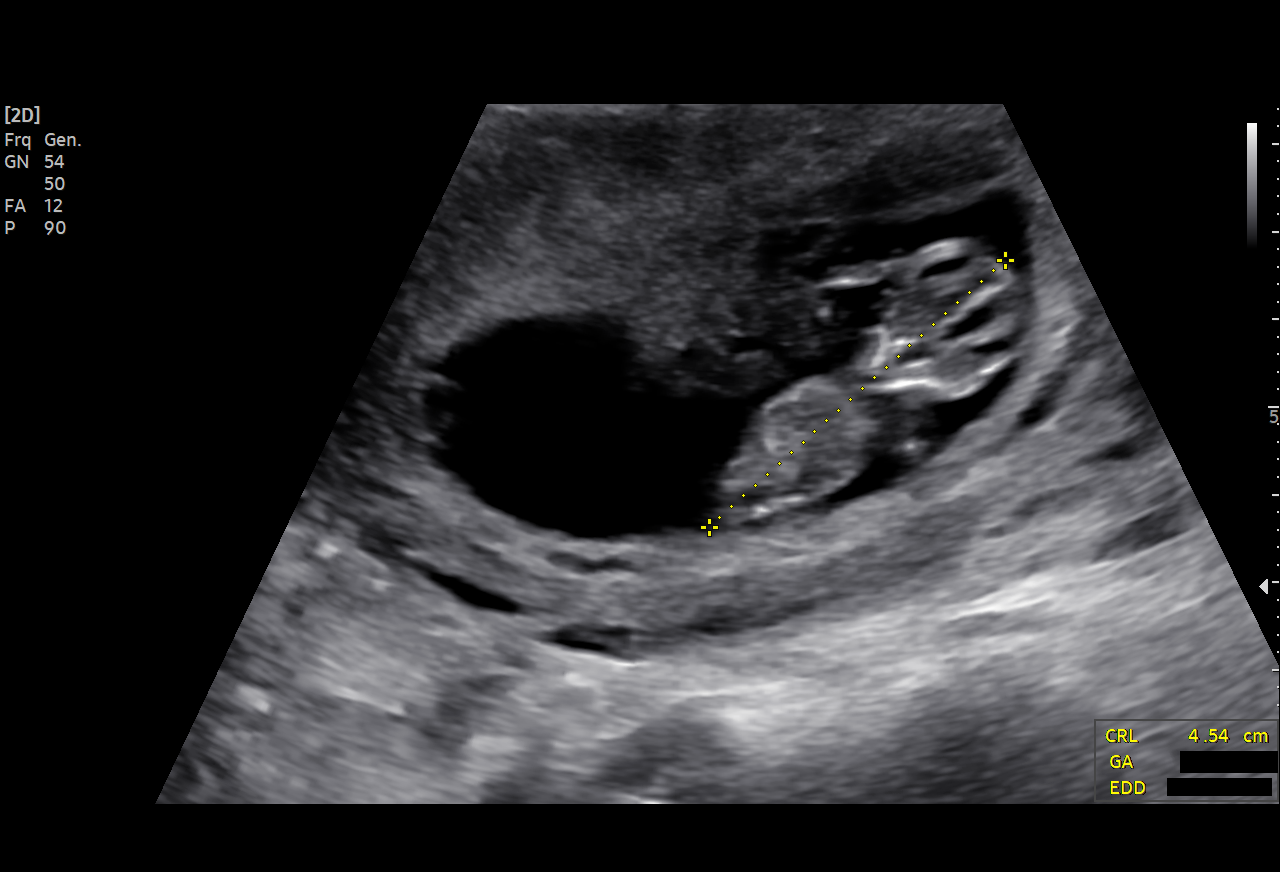
[im 3/5]
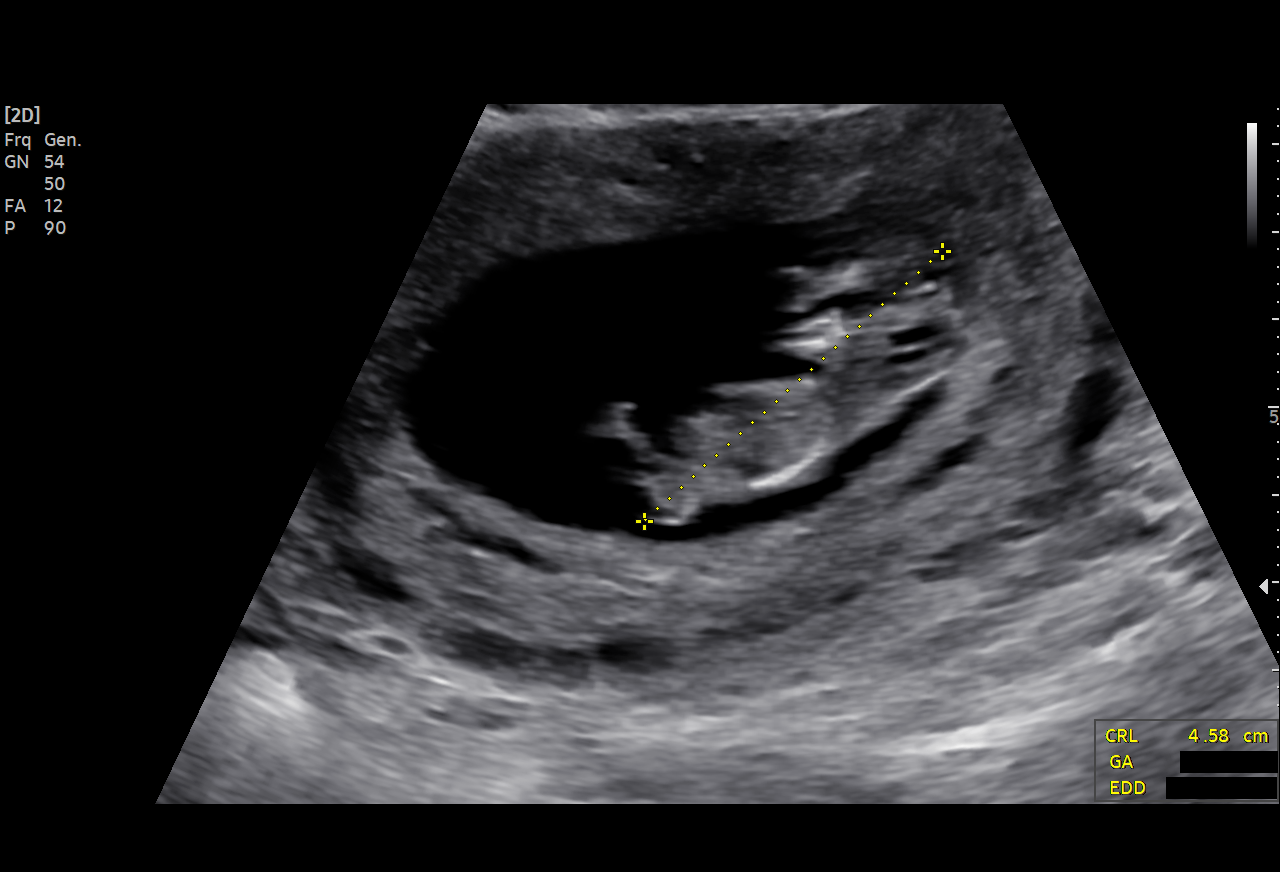
[im 4/5]
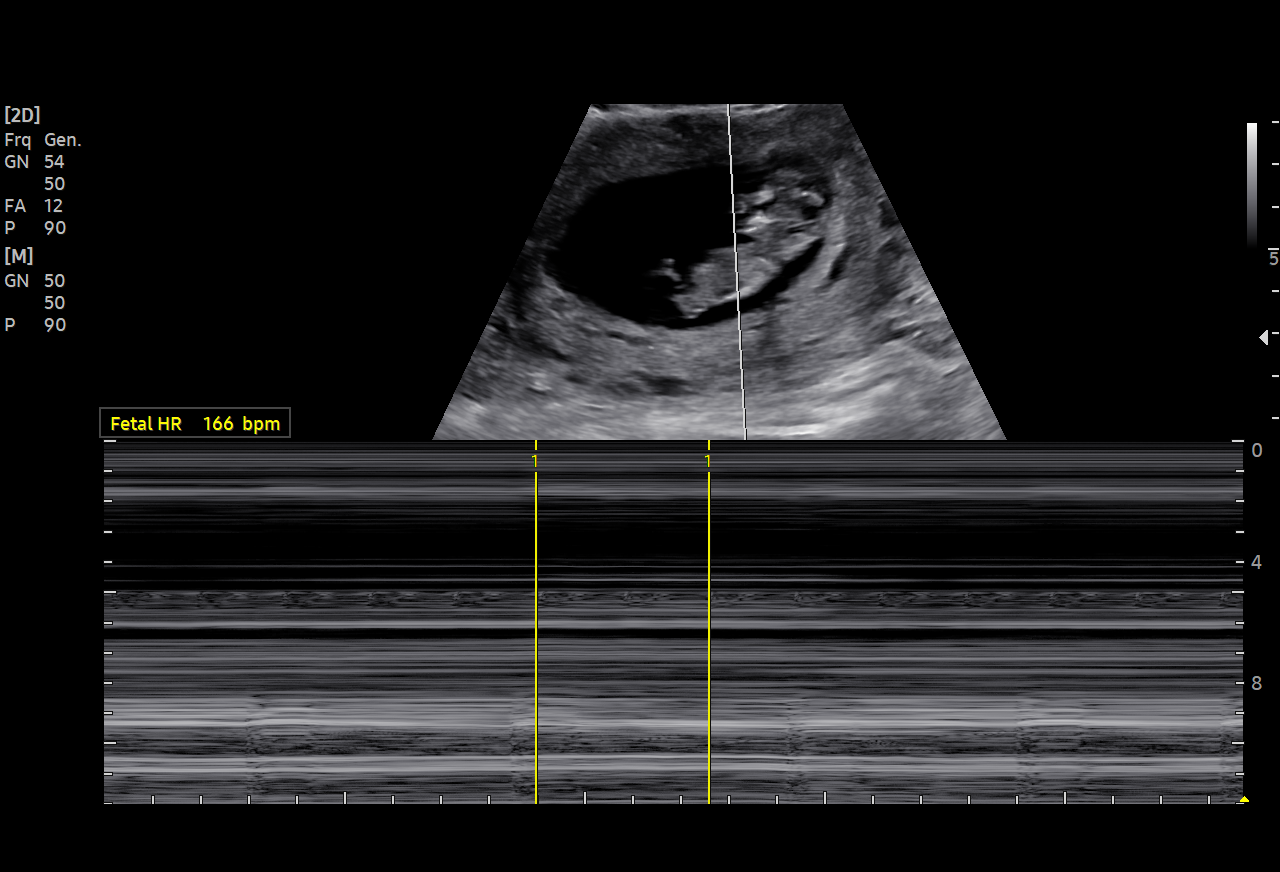
[im 5/5]
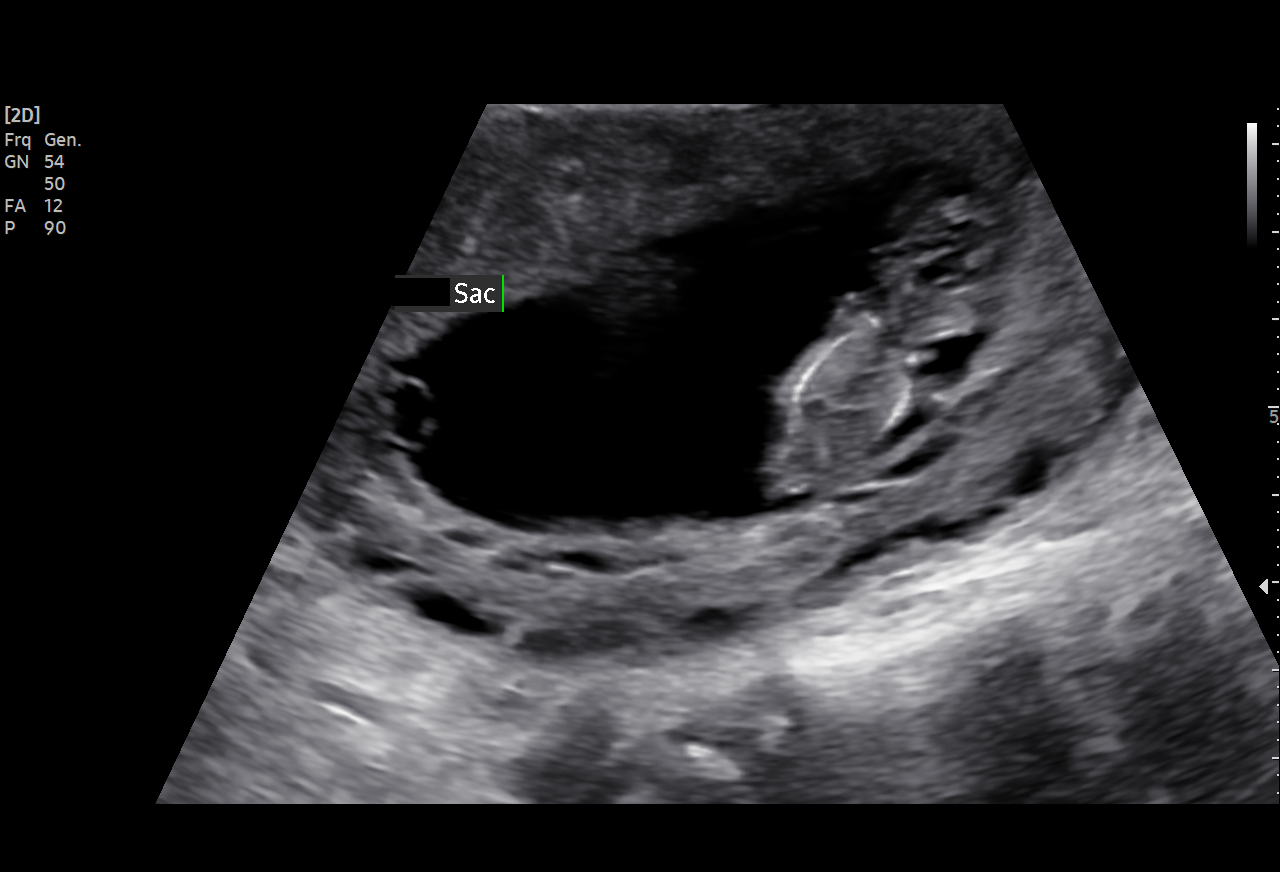

[5 of 5 positions shown; findings below may reference images not displayed]

[REDACTED]care

 1  [HOSPITAL]                         [address].0     [facility] SOFER

Indications

 11 weeks gestation of pregnancy
Fetal Evaluation

 Num Of Fetuses:         1
 Preg. Location:         Intrauterine
 Fetal Heart Rate(bpm):  166
 Cardiac Activity:       Observed
Biometry

 CRL:      46.2  mm     G. Age:  11w 2d                  EDD:   06/17/21
Gestational Age

 Best:          11w 2d     Det. By:  U/S C R L (11/28/20)     EDD:   06/17/21
Comments

 Single live IUP at 22w3d by CRL. Measurements are
 consistent with LMP 09/10/20 provided. Per Dr. Chokxhy
 patient to be referred to MFM for consult due to HX of
 stillbirth, uterine rupture, and hx  x 6 c-section.
Impression

 Viable single intrauterine pregnancy.
Recommendations

 Continue prenatal care and follow up with MFM as
 recommended.
              Ely, Sushma
